# Patient Record
Sex: Male | Born: 1945 | Race: White | Hispanic: No | Marital: Married | Smoking: Former smoker
Health system: Southern US, Community
[De-identification: ages and names within clinical notes are randomized; demographics above are authoritative.]

## PROBLEM LIST (undated history)

## (undated) DIAGNOSIS — T7840XA Allergy, unspecified, initial encounter: Secondary | ICD-10-CM

## (undated) DIAGNOSIS — Z87440 Personal history of urinary (tract) infections: Secondary | ICD-10-CM

## (undated) DIAGNOSIS — R011 Cardiac murmur, unspecified: Secondary | ICD-10-CM

## (undated) DIAGNOSIS — J45909 Unspecified asthma, uncomplicated: Secondary | ICD-10-CM

## (undated) DIAGNOSIS — C801 Malignant (primary) neoplasm, unspecified: Secondary | ICD-10-CM

## (undated) DIAGNOSIS — I2510 Atherosclerotic heart disease of native coronary artery without angina pectoris: Secondary | ICD-10-CM

## (undated) DIAGNOSIS — M1990 Unspecified osteoarthritis, unspecified site: Secondary | ICD-10-CM

## (undated) DIAGNOSIS — Q249 Congenital malformation of heart, unspecified: Secondary | ICD-10-CM

## (undated) DIAGNOSIS — I519 Heart disease, unspecified: Secondary | ICD-10-CM

## (undated) HISTORY — DX: Cardiac murmur, unspecified: R01.1

## (undated) HISTORY — DX: Allergy, unspecified, initial encounter: T78.40XA

## (undated) HISTORY — DX: Malignant (primary) neoplasm, unspecified: C80.1

## (undated) HISTORY — DX: Personal history of urinary (tract) infections: Z87.440

## (undated) HISTORY — PX: PACEMAKER IMPLANT: EP1218

## (undated) HISTORY — DX: Unspecified osteoarthritis, unspecified site: M19.90

## (undated) HISTORY — PX: REPLACEMENT TOTAL KNEE: SUR1224

## (undated) HISTORY — DX: Unspecified asthma, uncomplicated: J45.909

## (undated) HISTORY — DX: Congenital malformation of heart, unspecified: Q24.9

---

## 1991-10-17 HISTORY — PX: MELANOMA EXCISION: SHX5266

## 2006-10-16 HISTORY — PX: CHOLECYSTECTOMY: SHX55

## 2017-10-16 HISTORY — PX: HERNIA REPAIR: SHX51

## 2019-04-26 ENCOUNTER — Other Ambulatory Visit: Payer: Self-pay

## 2019-04-26 ENCOUNTER — Emergency Department (HOSPITAL_COMMUNITY): Payer: Medicare Other

## 2019-04-26 ENCOUNTER — Emergency Department (HOSPITAL_COMMUNITY)
Admission: EM | Admit: 2019-04-26 | Discharge: 2019-04-26 | Disposition: A | Discharge status: Home / Self Care | Payer: Medicare Other | Attending: Emergency Medicine | Admitting: Emergency Medicine

## 2019-04-26 ENCOUNTER — Encounter (HOSPITAL_COMMUNITY): Payer: Self-pay | Admitting: *Deleted

## 2019-04-26 DIAGNOSIS — Z950 Presence of cardiac pacemaker: Secondary | ICD-10-CM | POA: Diagnosis not present

## 2019-04-26 DIAGNOSIS — Y929 Unspecified place or not applicable: Secondary | ICD-10-CM | POA: Insufficient documentation

## 2019-04-26 DIAGNOSIS — Y999 Unspecified external cause status: Secondary | ICD-10-CM | POA: Insufficient documentation

## 2019-04-26 DIAGNOSIS — S6991XA Unspecified injury of right wrist, hand and finger(s), initial encounter: Secondary | ICD-10-CM | POA: Insufficient documentation

## 2019-04-26 DIAGNOSIS — I2510 Atherosclerotic heart disease of native coronary artery without angina pectoris: Secondary | ICD-10-CM | POA: Diagnosis not present

## 2019-04-26 DIAGNOSIS — Y33XXXA Other specified events, undetermined intent, initial encounter: Secondary | ICD-10-CM | POA: Diagnosis not present

## 2019-04-26 DIAGNOSIS — Y939 Activity, unspecified: Secondary | ICD-10-CM | POA: Insufficient documentation

## 2019-04-26 DIAGNOSIS — S6990XA Unspecified injury of unspecified wrist, hand and finger(s), initial encounter: Secondary | ICD-10-CM

## 2019-04-26 HISTORY — DX: Atherosclerotic heart disease of native coronary artery without angina pectoris: I25.10

## 2019-04-26 HISTORY — DX: Heart disease, unspecified: I51.9

## 2019-04-26 LAB — CBC WITH DIFFERENTIAL/PLATELET
Abs Immature Granulocytes: 0.02 10*3/uL (ref 0.00–0.07)
Basophils Absolute: 0 10*3/uL (ref 0.0–0.1)
Basophils Relative: 1 %
Eosinophils Absolute: 0.3 10*3/uL (ref 0.0–0.5)
Eosinophils Relative: 5 %
HCT: 38.4 % — ABNORMAL LOW (ref 39.0–52.0)
Hemoglobin: 12.8 g/dL — ABNORMAL LOW (ref 13.0–17.0)
Immature Granulocytes: 0 %
Lymphocytes Relative: 9 %
Lymphs Abs: 0.5 10*3/uL — ABNORMAL LOW (ref 0.7–4.0)
MCH: 32.1 pg (ref 26.0–34.0)
MCHC: 33.3 g/dL (ref 30.0–36.0)
MCV: 96.2 fL (ref 80.0–100.0)
Monocytes Absolute: 0.7 10*3/uL (ref 0.1–1.0)
Monocytes Relative: 11 %
Neutro Abs: 4.7 10*3/uL (ref 1.7–7.7)
Neutrophils Relative %: 74 %
Platelets: 211 10*3/uL (ref 150–400)
RBC: 3.99 MIL/uL — ABNORMAL LOW (ref 4.22–5.81)
RDW: 14.6 % (ref 11.5–15.5)
WBC: 6.3 10*3/uL (ref 4.0–10.5)
nRBC: 0 % (ref 0.0–0.2)

## 2019-04-26 LAB — BASIC METABOLIC PANEL
Anion gap: 16 — ABNORMAL HIGH (ref 5–15)
BUN: 111 mg/dL — ABNORMAL HIGH (ref 8–23)
CO2: 23 mmol/L (ref 22–32)
Calcium: 8.7 mg/dL — ABNORMAL LOW (ref 8.9–10.3)
Chloride: 101 mmol/L (ref 98–111)
Creatinine, Ser: 3.7 mg/dL — ABNORMAL HIGH (ref 0.61–1.24)
GFR calc Af Amer: 18 mL/min — ABNORMAL LOW (ref >60)
GFR calc non Af Amer: 15 mL/min — ABNORMAL LOW (ref >60)
Glucose, Bld: 113 mg/dL — ABNORMAL HIGH (ref 70–99)
Potassium: 3.2 mmol/L — ABNORMAL LOW (ref 3.5–5.1)
Sodium: 140 mmol/L (ref 135–145)

## 2019-04-26 MED ORDER — SODIUM CHLORIDE 0.9 % IV SOLN: INTRAVENOUS | Status: DC

## 2019-04-26 NOTE — ED Provider Notes (Addendum)
[facility] DEPT Provider Note   CSN: [id number] Arrival date & time: 04/26/19  1608     History   Chief Complaint Chief Complaint  Patient presents with  . Hand Pain    HPI Charles Mcmahon is a 73 y.o. male.     73 year old male presents with pain to his right middle finger which he injured several weeks ago.  Pain characterizes sharp and worse with any movement.  Self medicated at home with a splint.  Patient states he is not having shortness of breath or new dizziness.  States his blood pressure is normally low due to his history of cardiomyopathy and states that his ejection fraction is 35%.  Denies any new cardiac complaints at this time.  States compliance with his home medications and no recent changes to them.     Past Medical History:  Diagnosis Date  . Coronary artery disease     There are no active problems to display for this patient.   Past Surgical History:  Procedure Laterality Date  . PACEMAKER IMPLANT          Home Medications    Prior to Admission medications   Not on File    Family History No family history on file.  Social History Social History   Tobacco Use  . Smoking status: Not on file  Substance Use Topics  . Alcohol use: Not on file  . Drug use: Not on file     Allergies   Patient has no known allergies.   Review of Systems Review of Systems  All other systems reviewed and are negative.    Physical Exam Updated Vital Signs BP (!) 73/45 (BP Location: Left Arm)   Pulse 70   Temp 98 F (36.7 C) (Oral)   Resp 18   SpO2 (!) 88%   Physical Exam Vitals signs and nursing note reviewed.  Constitutional:      General: He is not in acute distress.    Appearance: Normal appearance. He is well-developed. He is not toxic-appearing.  HENT:     Head: Normocephalic and atraumatic.  Eyes:     General: Lids are normal.     Conjunctiva/sclera: Conjunctivae normal.     Pupils: Pupils are equal,  round, and reactive to light.  Neck:     Musculoskeletal: Normal range of motion and neck supple.     Thyroid: No thyroid mass.     Trachea: No tracheal deviation.  Cardiovascular:     Rate and Rhythm: Normal rate and regular rhythm.     Heart sounds: Normal heart sounds. No murmur. No gallop.   Pulmonary:     Effort: Pulmonary effort is normal. No respiratory distress.     Breath sounds: Normal breath sounds. No stridor. No decreased breath sounds, wheezing, rhonchi or rales.  Abdominal:     General: Bowel sounds are normal. There is no distension.     Palpations: Abdomen is soft.     Tenderness: There is no abdominal tenderness. There is no rebound.  Musculoskeletal: Normal range of motion.        General: No tenderness.       Hands:  Skin:    General: Skin is warm and dry.     Findings: No abrasion or rash.  Neurological:     Mental Status: He is alert and oriented to person, place, and time.     GCS: GCS eye subscore is 4. GCS verbal subscore is 5. GCS motor subscore  is 6.     Cranial Nerves: No cranial nerve deficit.     Sensory: No sensory deficit.  Psychiatric:        Speech: Speech normal.        Behavior: Behavior normal.      ED Treatments / Results  Labs (all labs ordered are listed, but only abnormal results are displayed) Labs Reviewed  CBC WITH DIFFERENTIAL/PLATELET  BASIC METABOLIC PANEL    EKG None  Radiology No results found.  Procedures Procedures (including critical care time)  Medications Ordered in ED Medications  0.9 %  sodium chloride infusion (has no administration in time range)     Initial Impression / Assessment and Plan / ED Course  I have reviewed the triage vital signs and the nursing notes.  Pertinent labs & imaging results that were available during my care of the patient were reviewed by me and considered in my medical decision making (see chart for details).       Patient's blood pressure noted and he states he is  normally hypotensive.  He is not orthostatic. Patient's labs reviewed discussed with patient.  Has evidence of kidney injury and patient states that he has CKD but does not know when his baseline creatinine is.  He became very upset about the possibility of staying in the hospital to have this further evaluated.  He is adamant that he would like to leave at this time.  He has capacity to make this decision.  Strongly encouraged to follow-up with his doctor.  X-ray of his hand is negative.  Final Clinical Impressions(s) / ED Diagnoses   Final diagnoses:  None    ED Discharge Orders    None       Lacretia Leigh, MD 04/26/19 1831    Lacretia Leigh, MD 04/26/19 1831

## 2019-04-26 NOTE — Discharge Instructions (Addendum)
You have been offered admission at this time and have deferred concerning your kidney decreased function.  Please follow-up with your doctor.  Your x-rays of your hand are negative

## 2019-04-26 NOTE — ED Triage Notes (Signed)
Pt comes in the middle finger pain on rt hand, noted to have low sats in mid 80's in triage, Hypotensive and bradycardic. Has cardiac history where he normally has both. Upon moving around to get in bed VSS

## 2019-05-27 ENCOUNTER — Ambulatory Visit (INDEPENDENT_AMBULATORY_CARE_PROVIDER_SITE_OTHER): Payer: Medicare Other | Admitting: Family Medicine

## 2019-05-27 ENCOUNTER — Encounter: Payer: Self-pay | Admitting: Family Medicine

## 2019-05-27 VITALS — BP 80/50 | HR 62 | Temp 98.9°F | Resp 16 | Ht 69.0 in | Wt 208.2 lb

## 2019-05-27 DIAGNOSIS — R601 Generalized edema: Secondary | ICD-10-CM

## 2019-05-27 DIAGNOSIS — N289 Disorder of kidney and ureter, unspecified: Secondary | ICD-10-CM

## 2019-05-27 DIAGNOSIS — I509 Heart failure, unspecified: Secondary | ICD-10-CM

## 2019-05-27 DIAGNOSIS — I959 Hypotension, unspecified: Secondary | ICD-10-CM

## 2019-05-27 DIAGNOSIS — Z7901 Long term (current) use of anticoagulants: Secondary | ICD-10-CM | POA: Diagnosis not present

## 2019-05-27 LAB — COMPREHENSIVE METABOLIC PANEL
ALT: 17 U/L (ref 0–53)
AST: 26 U/L (ref 0–37)
Albumin: 2.8 g/dL — ABNORMAL LOW (ref 3.5–5.2)
Alkaline Phosphatase: 527 U/L — ABNORMAL HIGH (ref 39–117)
BUN: 81 mg/dL — ABNORMAL HIGH (ref 6–23)
CO2: 28 mEq/L (ref 19–32)
Calcium: 9 mg/dL (ref 8.4–10.5)
Chloride: 100 mEq/L (ref 96–112)
Creatinine, Ser: 2.4 mg/dL — ABNORMAL HIGH (ref 0.40–1.50)
GFR: 26.6 mL/min — ABNORMAL LOW (ref >60.00)
Glucose, Bld: 106 mg/dL — ABNORMAL HIGH (ref 70–99)
Potassium: 4.3 mEq/L (ref 3.5–5.1)
Sodium: 138 mEq/L (ref 135–145)
Total Bilirubin: 0.9 mg/dL (ref 0.2–1.2)
Total Protein: 5.6 g/dL — ABNORMAL LOW (ref 6.0–8.3)

## 2019-05-27 LAB — URINALYSIS, ROUTINE W REFLEX MICROSCOPIC
Bilirubin Urine: NEGATIVE
Hgb urine dipstick: NEGATIVE
Ketones, ur: NEGATIVE
Leukocytes,Ua: NEGATIVE
Nitrite: NEGATIVE
RBC / HPF: NONE SEEN (ref 0–?)
Specific Gravity, Urine: 1.01 (ref 1.000–1.030)
Total Protein, Urine: NEGATIVE
Urine Glucose: NEGATIVE
Urobilinogen, UA: 0.2 (ref 0.0–1.0)
pH: 6 (ref 5.0–8.0)

## 2019-05-27 LAB — BRAIN NATRIURETIC PEPTIDE: Pro B Natriuretic peptide (BNP): 755 pg/mL — ABNORMAL HIGH (ref 0.0–100.0)

## 2019-05-27 LAB — CBC
HCT: 38 % — ABNORMAL LOW (ref 39.0–52.0)
Hemoglobin: 12.6 g/dL — ABNORMAL LOW (ref 13.0–17.0)
MCHC: 33.1 g/dL (ref 30.0–36.0)
MCV: 96.3 fl (ref 78.0–100.0)
Platelets: 288 10*3/uL (ref 150.0–400.0)
RBC: 3.94 Mil/uL — ABNORMAL LOW (ref 4.22–5.81)
RDW: 15.1 % (ref 11.5–15.5)
WBC: 5.9 10*3/uL (ref 4.0–10.5)

## 2019-05-27 LAB — POCT INR: INR: 5.5 — AB (ref 2.0–3.0)

## 2019-05-27 LAB — TSH: TSH: 5.92 u[IU]/mL — ABNORMAL HIGH (ref 0.35–4.50)

## 2019-05-27 MED ORDER — FUROSEMIDE 80 MG PO TABS: 80.0000 mg | ORAL_TABLET | Freq: Two times a day (BID) | ORAL | 3 refills | Status: DC

## 2019-05-27 MED ORDER — METOLAZONE 5 MG PO TABS: 5.0000 mg | ORAL_TABLET | Freq: Every day | ORAL | 2 refills | Status: DC | PRN

## 2019-05-27 NOTE — Patient Instructions (Signed)
A few things to remember from today's visit:   Chronic heart failure, unspecified heart failure type ([place]) - Plan: Ambulatory referral to Cardiology, Brain Natriuretic Peptide, furosemide (LASIX) 80 MG tablet, metolazone (ZAROXOLYN) 5 MG tablet  Edema, unspecified type - Plan: Comprehensive metabolic panel, Protein / creatinine ratio, urine, TSH, Urinalysis, Routine w reflex microscopic, CBC  Hypotension, unspecified hypotension type - Plan: Comprehensive metabolic panel, CBC I recommend taking the Zaroxolyn 5 mg daily  Depending on the lab results I might need to send you to the hospital.  Please be sure medication list is accurate. If a new problem present, please set up appointment sooner than planned today.

## 2019-05-27 NOTE — Progress Notes (Signed)
HPI:   Charles Mcmahon is a 73 y.o. male, who is here today to establish care.  Former PCP: He moved to [place] about 5 weeks ago from [place]. Last preventive routine visit: He is not sure.  Chronic medical problems: CHF, asthma, OA, history of melanoma, status post pacemaker placement,RLS,and BPH among some. He lives with his wife,he is her caregiver. She had CVA with residual weakness.  Today he is complaining of having a hernia. He noted bilateral scrotal edema about 5 weeks ago, which he attributed to heavy lifting during the move. Progressively getting worse. He is convinced this is a hernia.  He denies fever, chills, abdominal pain, nausea, vomiting, changes in bowel habits, or blood in the stool. Negative for dysuria, hematuria, increased urinary frequency, or decreased urine output.  He is having associated left inguinal pain, sharp, intermittently that for a few seconds. He has not identified exacerbating or alleviating factors.  He denies fever, chills, changes in appetite, or abnormal weight loss. He reports history of CHF, LVEF 35%. He is sleeping on one pillow, states that this is his usual. He denies PND. History of exertional dyspnea, which seems to be stable. He denies chest pain, palpitations, diaphoresis, or syncope.  Recently he was in the hospital due to a fall, BMP very abnormal, apparently he refused hospitalization. According to patient he "always" has had abnormal renal function but he has never seen a nephrologist.  Lab Results  Component Value Date   CREATININE 3.70 (H) 04/26/2019   BUN 111 (H) 04/26/2019   NA 140 04/26/2019   K 3.2 (L) 04/26/2019   CL 101 04/26/2019   CO2 23 04/26/2019   Lab Results  Component Value Date   WBC 6.3 04/26/2019   HGB 12.8 (L) 04/26/2019   HCT 38.4 (L) 04/26/2019   MCV 96.2 04/26/2019   PLT 211 04/26/2019   He is currently on furosemide 80 mg twice daily, metoprolol 50 mg 1/2 tablet twice  daily, losartan 50 mg daily  He is also on Coumadin 2.5 mg daily. Hx of "cardiac arrhythmia."  HTN,he is on Cozaar 50 mg daily and Metoprolol Succinate 50 mg daily. He is not checking BP at home.   Review of Systems  Constitutional: Positive for activity change. Negative for fatigue.  HENT: Negative for nosebleeds, sore throat and trouble swallowing.   Eyes: Negative for redness and visual disturbance.  Respiratory: Negative for cough and wheezing.   Cardiovascular: Negative for leg swelling.  Gastrointestinal: Negative for abdominal distention and rectal pain.  Endocrine: Negative for cold intolerance and heat intolerance.  Genitourinary: Negative for decreased urine volume, discharge, dysuria, flank pain, hematuria and testicular pain.  Musculoskeletal: Positive for arthralgias and gait problem.  Skin: Negative for rash and wound.  Allergic/Immunologic: Positive for environmental allergies.  Neurological: Negative for weakness and headaches.  Psychiatric/Behavioral: The patient is nervous/anxious.   Rest see pertinent positives and negatives per HPI.   Current Outpatient Medications on File Prior to Visit  Medication Sig Dispense Refill  . albuterol (VENTOLIN HFA) 108 (90 Base) MCG/ACT inhaler Inhale 2 puffs into the lungs as needed.    . ATROVENT HFA 17 MCG/ACT inhaler Inhale 2 puffs into the lungs 2 (two) times daily.    . finasteride (PROSCAR) 5 MG tablet Take by mouth daily.    Marland Kitchen losartan (COZAAR) 50 MG tablet Take by mouth daily.    . metoprolol succinate (TOPROL-XL) 50 MG 24 hr tablet Take by mouth. Take 1/2  tablet twice daily (25 mg)    . warfarin (COUMADIN) 2.5 MG tablet Take by mouth daily.     No current facility-administered medications on file prior to visit.      Past Medical History:  Diagnosis Date  . Allergy   . Arthritis   . Asthma   . Cancer ([place])   . Cardiac arrhythmia due to congenital heart disease   . Coronary artery disease   . Heart disease    . Heart disease   . Heart murmur   . Hx: UTI (urinary tract infection)    No Known Allergies  Family History  Problem Relation Age of Onset  . Arthritis Mother     Social History   Socioeconomic History  . Marital status: Married    Spouse name: Not on file  . Number of children: 2  . Years of education: Not on file  . Highest education level: Not on file  Occupational History  . Not on file  Social Needs  . Financial resource strain: Not on file  . Food insecurity    Worry: Not on file    Inability: Not on file  . Transportation needs    Medical: Not on file    Non-medical: Not on file  Tobacco Use  . Smoking status: Former Research scientist (life sciences)  . Smokeless tobacco: Never Used  Substance and Sexual Activity  . Alcohol use: Yes  . Drug use: Never  . Sexual activity: Not Currently  Lifestyle  . Physical activity    Days per week: Not on file    Minutes per session: Not on file  . Stress: Not on file  Relationships  . Social Herbalist on phone: Not on file    Gets together: Not on file    Attends religious service: Not on file    Active member of club or organization: Not on file    Attends meetings of clubs or organizations: Not on file    Relationship status: Not on file  Other Topics Concern  . Not on file  Social History Narrative  . Not on file    Vitals:   05/27/19 1228  BP: (!) 80/50  Pulse: 62  Resp: 16  Temp: 98.9 F (37.2 C)  SpO2: 97%    Body mass index is 30.75 kg/m.  Physical Exam  Nursing note and vitals reviewed. Constitutional: He is oriented to person, place, and time. He appears well-developed. No distress.  HENT:  Head: Normocephalic and atraumatic.  Mouth/Throat: Oropharynx is clear and moist and mucous membranes are normal.  Eyes: Pupils are equal, round, and reactive to light. Conjunctivae are normal.  Cardiovascular: Normal rate and regular rhythm.  No murmur heard. ? S3  Respiratory: Effort normal and breath sounds  normal. No respiratory distress.  GI: Soft. He exhibits no mass. There is no hepatomegaly. There is no abdominal tenderness.  Genitourinary:    Genitourinary Comments: Severe scrotal and penis edema.   Musculoskeletal:        General: Edema (3+ pitting LE's edema, right hand,and face.) present.  Lymphadenopathy:    He has no cervical adenopathy.  Neurological: He is alert and oriented to person, place, and time. He has normal strength.  Unstable gait assisted by cane.  Skin: Skin is warm. No rash noted. No erythema.  Psychiatric: He has a normal mood and affect.  Well groomed, good eye contact.    ASSESSMENT AND PLAN:  Mr. Haven was seen today for establish  care.  Diagnoses and all orders for this visit:  Lab Results  Component Value Date   CREATININE 2.40 (H) 05/27/2019   BUN 81 (H) 05/27/2019   NA 138 05/27/2019   K 4.3 05/27/2019   CL 100 05/27/2019   CO2 28 05/27/2019   Lab Results  Component Value Date   WBC 5.9 05/27/2019   HGB 12.6 (L) 05/27/2019   HCT 38.0 (L) 05/27/2019   MCV 96.3 05/27/2019   PLT 288.0 05/27/2019   Lab Results  Component Value Date   TSH 5.92 (H) 05/27/2019     Chronic heart failure, unspecified heart failure type [facility]) Cardiology referral placed. According to pt,he was told to keep BP low. He is already taking a good dose of furosemide. I recommended taking the metoprolol 5 mg daily before morning furosemide. We discussed side effects of this medication, including worsening renal function. Continue fluid restriction, 1 L/day of fluids.  -     Ambulatory referral to Cardiology -     Brain Natriuretic Peptide -     furosemide (LASIX) 80 MG tablet; Take 1 tablet (80 mg total) by mouth 2 (two) times daily. -     metolazone (ZAROXOLYN) 5 MG tablet; Take 1 tablet (5 mg total) by mouth daily as needed.  Anasarca I do not think scrotal edema is due to hernia but rather edema. No changes in Furosemide dose. Zaroxolyn 5 mg daily for now.  Low salt diet.  -     Comprehensive metabolic panel -     Cancel: Protein / creatinine ratio, urine -     TSH -     Urinalysis, Routine w reflex microscopic -     CBC -     Protein / creatinine ratio, urine  Hypotension, unspecified hypotension type Asymptomatic. He does not want me to change antihypertensive medications. Recommend monitoring BP at home.  -     Comprehensive metabolic panel -     CBC  Chronic anticoagulation INR supra therapeutic, 5.5. No evidence of active bleeding He was instructed to hold on Coumadin for the next 2 days. He has an appointment with Coumadin clinic on 05/29/2019. -     POC INR   Kidney disease He is reporting that he has Hx of renal disease but has not followed with nephrologist. ? AKF He refused going to the ER. I explained that depending of lab results today, he may need to be hospitalized, he tells me he will not go.He understand the high risk of complications that could even cause  death.  At the time of his visit I do not have records from former pcp.  He was clearly instructed about warning sign.  Return in about 1 week (around 06/03/2019) for Edema and renal function..     Mariena Meares G. [name], MD  [facility]. [place] office.

## 2019-05-28 LAB — PROTEIN / CREATININE RATIO, URINE
Creatinine, Urine: 74 mg/dL (ref 20–320)
Protein/Creat Ratio: 122 mg/g creat (ref 22–128)
Protein/Creatinine Ratio: 0.122 mg/mg creat (ref 0.022–0.12)
Total Protein, Urine: 9 mg/dL (ref 5–25)

## 2019-05-30 ENCOUNTER — Other Ambulatory Visit: Payer: Self-pay

## 2019-05-30 ENCOUNTER — Other Ambulatory Visit (INDEPENDENT_AMBULATORY_CARE_PROVIDER_SITE_OTHER): Payer: Medicare Other

## 2019-05-30 ENCOUNTER — Other Ambulatory Visit: Payer: Self-pay | Admitting: Family Medicine

## 2019-05-30 ENCOUNTER — Telehealth: Payer: Self-pay | Admitting: Family Medicine

## 2019-05-30 DIAGNOSIS — R748 Abnormal levels of other serum enzymes: Secondary | ICD-10-CM

## 2019-05-30 DIAGNOSIS — N289 Disorder of kidney and ureter, unspecified: Secondary | ICD-10-CM

## 2019-05-30 LAB — BASIC METABOLIC PANEL
BUN: 74 mg/dL — ABNORMAL HIGH (ref 6–23)
CO2: 28 mEq/L (ref 19–32)
Calcium: 8.4 mg/dL (ref 8.4–10.5)
Chloride: 100 mEq/L (ref 96–112)
Creatinine, Ser: 2.29 mg/dL — ABNORMAL HIGH (ref 0.40–1.50)
GFR: 28.08 mL/min — ABNORMAL LOW (ref >60.00)
Glucose, Bld: 154 mg/dL — ABNORMAL HIGH (ref 70–99)
Potassium: 3.8 mEq/L (ref 3.5–5.1)
Sodium: 139 mEq/L (ref 135–145)

## 2019-05-30 LAB — PSA: PSA: 0.25 ng/mL (ref 0.10–4.00)

## 2019-05-30 NOTE — Telephone Encounter (Signed)
Copied from [place] [phone]. Topic: General - Inquiry >> May 30, 2019  9:38 AM Virl Axe D wrote: Reason for CRM: [facility] would like to know if Dr. [name] would take over pt's coumadin monitoring. Requesting CB. #[phone]

## 2019-05-30 NOTE — Telephone Encounter (Signed)
Message sent to Dr. Jordan for review and approval. 

## 2019-05-30 NOTE — Telephone Encounter (Signed)
I thought I already addressed this question. Please schedule patient with Coumadin clinic. Thanks, BJ

## 2019-06-02 ENCOUNTER — Telehealth: Payer: Self-pay | Admitting: Family Medicine

## 2019-06-02 NOTE — Telephone Encounter (Signed)
Caller name: Jenn  Relation to pt: from [facility]  Call back number: [phone]   Reason for call:  Would like to know if PCP will take over patient INR, please advise

## 2019-06-03 ENCOUNTER — Telehealth: Payer: Self-pay | Admitting: Family Medicine

## 2019-06-03 NOTE — Telephone Encounter (Signed)
Left detailed message for [facility] concerning patient.

## 2019-06-03 NOTE — Telephone Encounter (Signed)
Pt returned call for lab results. He was read the note by Dr [name] on 06/03/2019. Pt states that he has Coumadin visit tomorrow scheduled with coumadin clinic.

## 2019-06-03 NOTE — Telephone Encounter (Signed)
Left detailed message for [facility] concerning patient.

## 2019-06-03 NOTE — Telephone Encounter (Signed)
Spoke with Danise Mina and informed her that patient can be seen with the coumadin clinic. Danise Mina stated that she will fax over last few results to the office.

## 2019-06-04 ENCOUNTER — Other Ambulatory Visit: Payer: Self-pay | Admitting: Family Medicine

## 2019-06-04 ENCOUNTER — Other Ambulatory Visit: Payer: Self-pay

## 2019-06-04 DIAGNOSIS — N5089 Other specified disorders of the male genital organs: Secondary | ICD-10-CM

## 2019-06-05 ENCOUNTER — Ambulatory Visit
Admission: RE | Admit: 2019-06-05 | Discharge: 2019-06-05 | Disposition: A | Discharge status: Home / Self Care | Payer: Medicare Other | Source: Ambulatory Visit | Attending: Family Medicine | Admitting: Family Medicine

## 2019-06-05 DIAGNOSIS — R748 Abnormal levels of other serum enzymes: Secondary | ICD-10-CM

## 2019-06-09 ENCOUNTER — Telehealth: Payer: Self-pay

## 2019-06-09 NOTE — Telephone Encounter (Signed)
Tried returning call to patient. Left message to return call to office.

## 2019-06-09 NOTE — Telephone Encounter (Signed)
Copied from [place] [phone]. Topic: General - Other >> Jun 09, 2019 11:19 AM Celene Kras A wrote: Reason for CRM: Pt called and is requesting to speak with PCP or her nurse about his condition. Pt states it has changed and he feels they should be updated. Please advise.

## 2019-06-10 NOTE — Telephone Encounter (Signed)
Left message for patient to return call to office. 

## 2019-06-10 NOTE — Telephone Encounter (Signed)
Patient returned call to clinic and scheduled ov for 06/17/2019 to discuss with PCP. Nothing further needed at this time.

## 2019-06-11 ENCOUNTER — Ambulatory Visit
Admission: RE | Admit: 2019-06-11 | Discharge: 2019-06-11 | Disposition: A | Discharge status: Home / Self Care | Payer: Medicare Other | Source: Ambulatory Visit | Attending: Family Medicine | Admitting: Family Medicine

## 2019-06-11 DIAGNOSIS — N5089 Other specified disorders of the male genital organs: Secondary | ICD-10-CM

## 2019-06-17 ENCOUNTER — Ambulatory Visit (INDEPENDENT_AMBULATORY_CARE_PROVIDER_SITE_OTHER): Payer: Medicare Other | Admitting: Family Medicine

## 2019-06-17 ENCOUNTER — Ambulatory Visit (INDEPENDENT_AMBULATORY_CARE_PROVIDER_SITE_OTHER): Payer: Medicare Other | Admitting: General Practice

## 2019-06-17 ENCOUNTER — Other Ambulatory Visit: Payer: Medicare Other

## 2019-06-17 ENCOUNTER — Other Ambulatory Visit: Payer: Self-pay

## 2019-06-17 ENCOUNTER — Ambulatory Visit (INDEPENDENT_AMBULATORY_CARE_PROVIDER_SITE_OTHER): Payer: Medicare Other

## 2019-06-17 ENCOUNTER — Telehealth: Payer: Self-pay | Admitting: *Deleted

## 2019-06-17 ENCOUNTER — Encounter: Payer: Self-pay | Admitting: Family Medicine

## 2019-06-17 VITALS — BP 82/54 | HR 60 | Temp 98.2°F | Resp 16 | Ht 69.0 in | Wt 194.0 lb

## 2019-06-17 DIAGNOSIS — Z23 Encounter for immunization: Secondary | ICD-10-CM | POA: Diagnosis not present

## 2019-06-17 DIAGNOSIS — R748 Abnormal levels of other serum enzymes: Secondary | ICD-10-CM

## 2019-06-17 DIAGNOSIS — Z7901 Long term (current) use of anticoagulants: Secondary | ICD-10-CM

## 2019-06-17 DIAGNOSIS — M545 Low back pain, unspecified: Secondary | ICD-10-CM

## 2019-06-17 DIAGNOSIS — N289 Disorder of kidney and ureter, unspecified: Secondary | ICD-10-CM | POA: Insufficient documentation

## 2019-06-17 DIAGNOSIS — I4821 Permanent atrial fibrillation: Secondary | ICD-10-CM | POA: Insufficient documentation

## 2019-06-17 DIAGNOSIS — R739 Hyperglycemia, unspecified: Secondary | ICD-10-CM

## 2019-06-17 DIAGNOSIS — N189 Chronic kidney disease, unspecified: Secondary | ICD-10-CM | POA: Diagnosis not present

## 2019-06-17 DIAGNOSIS — I4891 Unspecified atrial fibrillation: Secondary | ICD-10-CM

## 2019-06-17 DIAGNOSIS — I119 Hypertensive heart disease without heart failure: Secondary | ICD-10-CM | POA: Diagnosis not present

## 2019-06-17 DIAGNOSIS — I509 Heart failure, unspecified: Secondary | ICD-10-CM | POA: Diagnosis not present

## 2019-06-17 LAB — BASIC METABOLIC PANEL
BUN: 92 mg/dL (ref 6–23)
CO2: 30 mEq/L (ref 19–32)
Calcium: 8.3 mg/dL — ABNORMAL LOW (ref 8.4–10.5)
Chloride: 97 mEq/L (ref 96–112)
Creatinine, Ser: 2.63 mg/dL — ABNORMAL HIGH (ref 0.40–1.50)
GFR: 23.93 mL/min — ABNORMAL LOW (ref >60.00)
Glucose, Bld: 98 mg/dL (ref 70–99)
Potassium: 3.4 mEq/L — ABNORMAL LOW (ref 3.5–5.1)
Sodium: 137 mEq/L (ref 135–145)

## 2019-06-17 LAB — POCT INR: INR: 5.4 — AB (ref 2.0–3.0)

## 2019-06-17 LAB — BRAIN NATRIURETIC PEPTIDE: Pro B Natriuretic peptide (BNP): 661 pg/mL — ABNORMAL HIGH (ref 0.0–100.0)

## 2019-06-17 MED ORDER — METOPROLOL SUCCINATE ER 25 MG PO TB24: 25.0000 mg | ORAL_TABLET | Freq: Every day | ORAL | 1 refills | Status: DC

## 2019-06-17 NOTE — Patient Instructions (Addendum)
Pre visit review using our clinic review tool, if applicable. No additional management support is needed unless otherwise documented below in the visit note.  Hold coumadin today, Wed and Thursday. On Friday continue 1 tablet (2 mg) daily and re-check on Monday 9/14.  Patient had appointment with Dr. [name] at [place] today.  INR was checked in the lab at [facility] and results routed to [place] [place] [facility].

## 2019-06-17 NOTE — Telephone Encounter (Signed)
Spoke with patient and gave directions per Dr. Jordan. Patient verbalized understanding. 

## 2019-06-17 NOTE — Progress Notes (Signed)
HPI:   Charles Mcmahon is a 73 y.o. male, who is here today for chronic disease management.  Appointment with cardiologist and nephrologist are pending.  Denies severe/frequent headache, visual changes, chest pain, dyspnea, palpitation, claudication, focal weakness.  Edema has improved greatly. Problem is worse at the end of the day after prolonged standing,including scrotal edema. Denies LE or testicular pain.  He is on Furosemide 80 mg bid ans Metolazone 5 mg before morning Furosemide.  Scrotal US on 06/11/19: Significant BILATERAL scrotal wall edema. Normal appearing testes and LEFT epididymis. Heterogeneous enlarged and mildly hypervascular RIGHT epididymis question epididymitis, with associated small reactive hydrocele.  BP is low today and has been low during prior visits, he is not checking BP at home. Currently he is on metoprolol succinate 1/2 tablet twice daily and losartan 50 mg daily.  He denies orthopnea or PND.  No Hx of DM,glucose has been elevated at 154.  CKD, he already received phone call with information about appointment with nephrologist, next week. He denies gross hematuria, foamy urine, or decreased urine output.  Lab Results  Component Value Date   CREATININE 2.29 (H) 05/30/2019   BUN 74 (H) 05/30/2019   NA 139 05/30/2019   K 3.8 05/30/2019   CL 100 05/30/2019   CO2 28 05/30/2019   Atrial fibrillation: He did not have INR checked as instructed. Last INR was 5.5 (goal 2-3). Pending appt with cardiologist.  He is taking Coumadin 2 mg daily. He denies nose/gum bleeding, blood in the stool, or more bruising than usual.  We also review results of recent abdominal US. He denies high alcohol intake.  Elevated alk phosphatase and abnormal liver US.  Lab Results  Component Value Date   ALT 17 05/27/2019   AST 26 05/27/2019   ALKPHOS 527 (H) 05/27/2019   BILITOT 0.9 05/27/2019   Lab Results  Component Value Date   PSA 0.25  05/30/2019    Abdominal US 06/11/19: AIMPRESSION: 1. Liver contour and echotexture suggest a degree of underlying cirrhosis. Appropriate laboratory correlation advised in this regard. 2. Uniformly hyperechoic mass in the right lobe of the liver measuring 1.7 x 1.5 x 1.7 cm. Suspect hemangioma. As there are no prior studies to compare, a follow-up ultrasound of the liver in 1 year to confirm stability advised. 3.  Gallbladder absent. 4. Size discrepancy between kidneys, a finding of uncertain significance. This finding potentially could indicate a degree of renal artery stenosis on the right. In this regard, question whether patient is hypertensive. 5.  Mild ascites. 6. Portions of pancreas obscured by gas. Visualized portions ofpancreas appear normal. 7.  Small left renal cyst.bdominal [place] 06/11/19:   Review of Systems  Constitutional: Positive for fatigue. Negative for chills and fever.  HENT: Negative for mouth sores, nosebleeds and sore throat.   Gastrointestinal: Negative for abdominal pain, nausea and vomiting.  Musculoskeletal: Positive for gait problem.  Skin: Negative for rash and wound.  Neurological: Negative for syncope and facial asymmetry.  Rest see pertinent positives and negatives per HPI.   Current Outpatient Medications on File Prior to Visit  Medication Sig Dispense Refill  . albuterol (VENTOLIN HFA) 108 (90 Base) MCG/ACT inhaler Inhale 2 puffs into the lungs as needed.    . ATROVENT HFA 17 MCG/ACT inhaler Inhale 2 puffs into the lungs 2 (two) times daily.    . finasteride (PROSCAR) 5 MG tablet Take by mouth daily.    . furosemide (LASIX) 80 MG tablet Take  1 tablet (80 mg total) by mouth 2 (two) times daily. 60 tablet 3  . losartan (COZAAR) 50 MG tablet Take by mouth daily.    . metolazone (ZAROXOLYN) 5 MG tablet Take 1 tablet (5 mg total) by mouth daily as needed. 30 tablet 2  . warfarin (COUMADIN) 2.5 MG tablet Take by mouth daily.     No current  facility-administered medications on file prior to visit.      Past Medical History:  Diagnosis Date  . Allergy   . Arthritis   . Asthma   . Cancer ([place])   . Cardiac arrhythmia due to congenital heart disease   . Coronary artery disease   . Heart disease   . Heart disease   . Heart murmur   . Hx: UTI (urinary tract infection)    No Known Allergies  Social History   Socioeconomic History  . Marital status: Married    Spouse name: Not on file  . Number of children: 2  . Years of education: Not on file  . Highest education level: Not on file  Occupational History  . Not on file  Social Needs  . Financial resource strain: Not on file  . Food insecurity    Worry: Not on file    Inability: Not on file  . Transportation needs    Medical: Not on file    Non-medical: Not on file  Tobacco Use  . Smoking status: Former Research scientist (life sciences)  . Smokeless tobacco: Never Used  Substance and Sexual Activity  . Alcohol use: Yes  . Drug use: Never  . Sexual activity: Not Currently  Lifestyle  . Physical activity    Days per week: Not on file    Minutes per session: Not on file  . Stress: Not on file  Relationships  . Social Herbalist on phone: Not on file    Gets together: Not on file    Attends religious service: Not on file    Active member of club or organization: Not on file    Attends meetings of clubs or organizations: Not on file    Relationship status: Not on file  Other Topics Concern  . Not on file  Social History Narrative  . Not on file    Vitals:   06/17/19 0830  BP: (!) 82/54  Pulse: 60  Resp: 16  Temp: 98.2 F (36.8 C)  SpO2: 94%   Body mass index is 28.65 kg/m.   Wt Readings from Last 3 Encounters:  06/17/19 194 lb (88 kg)  05/27/19 208 lb 4 oz (94.5 kg)  04/26/19 200 lb (90.7 kg)    Physical Exam  Nursing note reviewed. Constitutional: He is oriented to person, place, and time. He appears well-developed. No distress.  HENT:  Head:  Normocephalic and atraumatic.  Mouth/Throat: Oropharynx is clear and moist and mucous membranes are normal.  Eyes: Pupils are equal, round, and reactive to light. Conjunctivae are normal.  Cardiovascular: Normal rate. An irregular rhythm present.  Occasional extrasystoles are present.  No murmur heard. Pulses:      Posterior tibial pulses are 2+ on the right side and 2+ on the left side.  Respiratory: Effort normal and breath sounds normal. No respiratory distress.  GI: Soft. He exhibits no mass. There is no hepatomegaly. There is no abdominal tenderness.  Musculoskeletal:        General: Edema (2+ pitting LE edema,bilateral.Mild upper eye lid edema.) present.     Thoracic back:  He exhibits no tenderness and no bony tenderness.     Lumbar back: He exhibits no tenderness and no bony tenderness.  Lymphadenopathy:    He has no cervical adenopathy.  Neurological: He is alert and oriented to person, place, and time. He has normal strength. No cranial nerve deficit. Gait abnormal.  Unstable gait assisted with a cane.  Skin: Skin is warm. No rash noted. No erythema.  Psychiatric: He has a normal mood and affect. Cognition and memory are normal.  Well groomed, good eye contact.    ASSESSMENT AND PLAN:  Mr. Aydrien was seen today for discuss results.  Diagnoses and all orders for this visit:  Lab Results  Component Value Date   CREATININE 2.63 (H) 06/17/2019   BUN 92 (HH) 06/17/2019   NA 137 06/17/2019   K 3.4 (L) 06/17/2019   CL 97 06/17/2019   CO2 30 06/17/2019   Lab Results  Component Value Date   INR 5.4 (A) 06/17/2019   INR 5.5 (A) 05/27/2019    Elevated alkaline phosphatase level Unknown etiology. Lumbar X ray ordered to evaluate for lytic lesions. We will continue following. RUQ to be related in a year.  -     DG Lumbar Spine Complete; Future  Chronic anticoagulation INR has been supra therapeutic. He is not having signs of bleeding. He is going to continue  following with coumadin clinic,seeing today.  -     POC INR  Chronic kidney disease, unspecified CKD stage Problem has been otherwise stable. He is not sure about Hx of CKD before ER visit,we do not have records from former PCP. No changes in diuretic or Losartan for now.  -     Basic metabolic panel; Future  Hypertension with heart disease BP is low. Metoprolol Succinate dose changed from 50 mg to 25 mg daily. Monitor BP at home. Continue low salt diet.  -     Basic metabolic panel -     metoprolol succinate (TOPROL-XL) 25 MG 24 hr tablet; Take 1 tablet (25 mg total) by mouth daily. -     Basic metabolic panel; Future  Chronic heart failure, unspecified heart failure type ([place]) Asymptomatic. On Metoprolol succinate and Losartan. No changes in Furosemide dose. We do not have reports of past echos.  -     metoprolol succinate (TOPROL-XL) 25 MG 24 hr tablet; Take 1 tablet (25 mg total) by mouth daily. -     Brain Natriuretic Peptide -     Basic metabolic panel; Future  Right-sided low back pain without sciatica, unspecified chronicity Most likely musculoskeletal. Because elevated alk phosphatase,X ray was ordered. Instructed about warning signs.  -     DG Lumbar Spine Complete; Future  Need for influenza vaccination -     Flu Vaccine QUAD High Dose(Fluad)  Atrial fibrillation, unspecified type ([place]) Rate controlled. Continue coumadin and Metoprolol. Pending appt with cardiologist.  Hyperglycemia -     Hemoglobin A1c; Future   Return in about 2 months (around 08/17/2019) for HTN. He needs coumadin visit in a week..    -Mr. Vasil A Mathers was advised to return sooner than planned today if new concerns arise.    [name]. [name], MD  [facility]. [place] office.

## 2019-06-17 NOTE — Telephone Encounter (Signed)
Left vm message for patient to return call to office concerning lab results.

## 2019-06-17 NOTE — Patient Instructions (Addendum)
A few things to remember from today's visit:   Elevated alkaline phosphatase level  Chronic anticoagulation - Plan: POC INR  Kidney disease  Hypertension with heart disease - Plan: Basic metabolic panel, metoprolol succinate (TOPROL-XL) 25 MG 24 hr tablet  Chronic heart failure, unspecified heart failure type ([place]) - Plan: metoprolol succinate (TOPROL-XL) 25 MG 24 hr tablet, Brain Natriuretic Peptide  Because your blood pressure is low I am decreasing metoprolol succinate from 50 mg to 25 mg. No changes in losartan. Monitor blood pressure at home. INR is being checked today, will adjust Coumadin accordingly. Keep appointment with nephrologist. Pending appointment with cardiologist. Continue low-salt diet and fluid restriction. No changes in fluid pills.  Please be sure medication list is accurate. If a new problem present, please set up appointment sooner than planned today.

## 2019-06-17 NOTE — Telephone Encounter (Signed)
Hope from [facility] called to give critical lab for patient, BUN is high at 92.

## 2019-06-17 NOTE — Telephone Encounter (Signed)
He is on Metolazone 5 mg daily (please verify),skip dose today and tomorrow.  Then continue metolazone 5 mg daily every other day.  He has an appt with nephrologist next week,BMP needs to be repeated Thursday.  Thanks, BJ

## 2019-06-25 ENCOUNTER — Telehealth: Payer: Self-pay

## 2019-06-25 NOTE — Telephone Encounter (Signed)
Copied from [place] [id number]. Topic: General - Other >> Jun 24, 2019  3:24 PM Mathis Bud wrote: Reason for CRM: Patient is calling to request a call back due from PCP, due to his hernia. He is requesting pain medication.  Patient states he cannot move around.   Call back [phone]

## 2019-06-26 NOTE — Telephone Encounter (Signed)
Spoke to pt and he stated that he is still having pain but its not constant. Pt stated that he never knows when the pain is going to "kick in". Pt is wanting a Psychologist, sport and exercise for hernia issue A.S.A.P.

## 2019-06-27 NOTE — Telephone Encounter (Signed)
I am not certain a hernia is causing pain. Even if in fact he has a hernia he is a high risk surgical candidate due to health issues/kidney disease.  Scrotal and abdominal US did not report hernia but mild epididymitis and scrotal wall edema. We could not arrange for abdominal CT because abnormal renal function.  We could ask urologist for consultation.  He was supposed to see nephrologist this week, did he? Has he seen cardiologist?  Thanks, BJ

## 2019-06-30 ENCOUNTER — Ambulatory Visit: Payer: Medicare Other

## 2019-06-30 ENCOUNTER — Other Ambulatory Visit: Payer: Self-pay

## 2019-06-30 ENCOUNTER — Ambulatory Visit (INDEPENDENT_AMBULATORY_CARE_PROVIDER_SITE_OTHER): Payer: Medicare Other | Admitting: General Practice

## 2019-06-30 DIAGNOSIS — Z7901 Long term (current) use of anticoagulants: Secondary | ICD-10-CM | POA: Diagnosis not present

## 2019-06-30 LAB — POCT INR: INR: 4.5 — AB (ref 2.0–3.0)

## 2019-06-30 NOTE — Patient Instructions (Signed)
Pre visit review using our clinic review tool, if applicable. No additional management support is needed unless otherwise documented below in the visit note.  Skip coumadin today and tomorrow (9/14 and 9/15).  On Wednesday start taking 1 tablet daily except 1/2 tablet on Monday and Fridays.  Re-check in 2 weeks.

## 2019-07-01 NOTE — Progress Notes (Signed)
I have reviewed available documentation from this visit and I agree with recommendations given.  Floyd Wade G. Hennesy Sobalvarro, MD  [facility]. [place] office.  

## 2019-07-02 NOTE — Telephone Encounter (Signed)
Patient notified of update  and verbalized understanding. 

## 2019-07-14 ENCOUNTER — Ambulatory Visit (INDEPENDENT_AMBULATORY_CARE_PROVIDER_SITE_OTHER): Payer: Medicare Other | Admitting: General Practice

## 2019-07-14 ENCOUNTER — Other Ambulatory Visit: Payer: Self-pay

## 2019-07-14 DIAGNOSIS — Z7901 Long term (current) use of anticoagulants: Secondary | ICD-10-CM | POA: Diagnosis not present

## 2019-07-14 LAB — POCT INR: INR: 3.6 — AB (ref 2.0–3.0)

## 2019-07-14 NOTE — Patient Instructions (Signed)
Pre visit review using our clinic review tool, if applicable. No additional management support is needed unless otherwise documented below in the visit note.  Skip coumadin today and then change dosage and start taking 1 tablet daily except 1/2 tablet on Monday Wed and Fridays.  Re-check in 3 weeks. If you get the coag meter within the next 3 weeks check your INR and call me with results at [phone].

## 2019-07-17 ENCOUNTER — Ambulatory Visit (INDEPENDENT_AMBULATORY_CARE_PROVIDER_SITE_OTHER): Payer: Medicare Other | Admitting: General Practice

## 2019-07-17 DIAGNOSIS — Z7901 Long term (current) use of anticoagulants: Secondary | ICD-10-CM | POA: Diagnosis not present

## 2019-07-17 LAB — POCT INR: INR: 2.4 (ref 2.0–3.0)

## 2019-07-17 NOTE — Patient Instructions (Signed)
Pre visit review using our clinic review tool, if applicable. No additional management support is needed unless otherwise documented below in the visit note.  Continue taking 1 tablet daily except 1/2 tablet on Monday Wed and Fridays.  Re-check in 2 weeks. Patient is using [employer] Connected home monitoring.

## 2019-08-01 ENCOUNTER — Ambulatory Visit (INDEPENDENT_AMBULATORY_CARE_PROVIDER_SITE_OTHER): Payer: Medicare Other | Admitting: General Practice

## 2019-08-01 DIAGNOSIS — Z7901 Long term (current) use of anticoagulants: Secondary | ICD-10-CM

## 2019-08-01 LAB — POCT INR: INR: 2.5 (ref 2.0–3.0)

## 2019-08-01 NOTE — Progress Notes (Signed)
Medical screening examination/treatment/procedure(s) were performed by non-physician practitioner and as supervising physician I was immediately available for consultation/collaboration. I agree with above. Raahil Ong, MD   

## 2019-08-01 NOTE — Patient Instructions (Signed)
Pre visit review using our clinic review tool, if applicable. No additional management support is needed unless otherwise documented below in the visit note.  Continue taking 1 tablet daily except 1/2 tablet on Monday Wed and Fridays.  Re-check in 2 weeks. Patient is using [employer] Connected home monitoring.  Patient verbalized dosing instructions.

## 2019-08-05 ENCOUNTER — Encounter: Payer: Self-pay | Admitting: Cardiology

## 2019-08-05 ENCOUNTER — Other Ambulatory Visit: Payer: Self-pay

## 2019-08-05 ENCOUNTER — Ambulatory Visit (INDEPENDENT_AMBULATORY_CARE_PROVIDER_SITE_OTHER): Payer: Medicare Other | Admitting: Cardiology

## 2019-08-05 VITALS — BP 84/53 | HR 70 | Ht 69.0 in | Wt 176.0 lb

## 2019-08-05 DIAGNOSIS — I442 Atrioventricular block, complete: Secondary | ICD-10-CM | POA: Diagnosis not present

## 2019-08-05 DIAGNOSIS — I4821 Permanent atrial fibrillation: Secondary | ICD-10-CM

## 2019-08-05 DIAGNOSIS — I509 Heart failure, unspecified: Secondary | ICD-10-CM

## 2019-08-05 DIAGNOSIS — I428 Other cardiomyopathies: Secondary | ICD-10-CM

## 2019-08-05 DIAGNOSIS — Z950 Presence of cardiac pacemaker: Secondary | ICD-10-CM | POA: Diagnosis not present

## 2019-08-05 DIAGNOSIS — N184 Chronic kidney disease, stage 4 (severe): Secondary | ICD-10-CM

## 2019-08-05 DIAGNOSIS — I5042 Chronic combined systolic (congestive) and diastolic (congestive) heart failure: Secondary | ICD-10-CM | POA: Diagnosis not present

## 2019-08-05 MED ORDER — FUROSEMIDE 80 MG PO TABS: ORAL_TABLET | ORAL | 3 refills | Status: DC

## 2019-08-05 NOTE — Patient Instructions (Addendum)
Medication Instructions:  Stop metoprolol Stop losartan Check blood pressure daily, please contact us if it remains 80s/50s  *If you need a refill on your cardiac medications before your next appointment, please call your pharmacy*  Lab Work: None  Testing/Procedures: Echocardiogram  Follow-Up: Referred to device clinic, advanced heart failure clinic

## 2019-08-05 NOTE — Progress Notes (Signed)
Cardiology Office Note:    Date:  08/05/2019   ID:  COLLYN RIBAS, DOB 05-31-1946, MRN [id number]  PCP:  [name], [name], MD  Cardiologist:  Buford Dresser, MD  Referring MD: [name], [name], MD   CC: New patient evaluation for heart failure  History of Present Illness:    Charles Mcmahon is a 73 y.o. 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  Cardiac history: Fine until a few years ago, until he was having low energy. Found to have EF of ~35%. Does not have CAD that he is aware of. Has [place] monitoring system for his St Jude device  Has had medications changed around a lot. Does not feel he has improved at all on meds. Kidneys are borderline, has been limiting in terms of his medications. Has a nephrologist here, Dr. Moshe Cipro with [facility], who has been managing his medications. Last labs I have are from 07/02/19, Cr 2.38, K 3.4.  BP runs low, frequently lightheaded. No syncope. Has been low for months. The last note I can see in Care Everywhere, his BP was 102/64 (02/27/19). When he saw Dr. [name] on 05/27/19, it was 80/50. On 06/17/19, it was 82/54.  Meds: furosemide 160 mg AM/80 mg PM, metolazone 2.5 mg as needed (about every other day), losartan 12.5 mg daily (has been cut down), metoprolol (thinks it is 12.5 mg daily)  Doesn't weigh himself daily, weight is the lowest it has been in many years. Appetite is fine, feels that he eats well. Watches salt intake closely. Watches fluid level closely, though balances between kidney issues and heart failure.   Breathing is excellent, no issues. Very minimal functional capacity, can barely walk around the store  to shop.  Denies chest pain, shortness of breath at rest or with normal exertion. No PND, orthopnea, or unexpected weight gain that he knows of. No syncope or palpitations.  Moved from [place], prior cardiologist: Woodward Ku, MD [address] [place] [address] In [facility]  I was able to request records after the visit from [facility], now in [place]. My summary of records is below: -BiV/CRT upgrade 03/20/2017, declined ICD at that point -chronic systolic heart failure, nonischemic cardiomyopathy, thought to be 2/2 long term RV pacing -chronic atrial fibrillation, requires no medical management of heart rate as he is pacer dependent -long term anticoagulation with coumadin (I cannot see that a DOAC was tried) -reported nonsustained VT, none note at his visit 03/27/2018  From Note 03/27/18 (quotation from note)  Cardiac Catheterization Report: Normal coronary arteries. Moderately severe LV dysfunction. Estimated EF 35%. Trivial MR. Pressures: RA mean 17, RV 68/11, PA 65/32, PCW mean 41 - 02/12/2013  Echocardiogram Report: Images were obtained in slow afib and rate of 70bpm and with 100% biventricular pacing. LVEF (40%). No regional wall motion abnormalities. Diastolic dysfunction. Severe left ventricular hypertrophy and mild left atrial enlargement suggest hypertensive heart disease. Mildly reduced right ventricular function. Mild MR. Mild-moderate TR. No pulmonary hypertension, pulmonary artery systolic pressure [id number]. Mildly dilated IVC diameter. Small to moderate sized pericardial effusion without any obvious hemodynamic significance. Pacing wires seen in RA and RV. There is echocardiogram  evidence for mild systolic congestive heart failure at the time of this study. Compared with prior echocardiogram report of 03/07/2017, the LVEF may have improved slightly. - 09/21/2017  Pacemaker Information Report: On 04/12/2009 he had a dual chamber  right sided pacer implant for AV block. Due to AV Block, pacer dependent, non-ischemic cardiomyopathy, CHF, LVEF of 35%, this was upgraded to a new biventricular pacing system on 03/20/2017. A bi-ventricular (RA - RV - LV) pacemaker was implanted at [facility] by Dr. Tracie Harrier. Alejandro Mulling Jude Model: [id number] Ser No: [id number] Follow-up Method: [facility] Jude) - 03/20/2017 Last Pacemaker Check: Patient's bi-ventricular (RA - RV - LV) St Jude pacemaker was interrogated on 12/20/2017. Remote interrogation was performed. Battery level: good. Patient is pacer dependant. Underlying rhythm: complete heart block, slow afib. The patient is ventricularly paced 97 % of the time with the setting of VVI-R - 70. There were 0 ventricular episodes. Normal bi-ventricular (RA - RV - LV) pacemaker function. - 12/20/2017    Past Medical History:  Diagnosis Date   Allergy    Arthritis    Asthma    Cancer ([place])    Cardiac arrhythmia due to congenital heart disease    Coronary artery disease    Heart disease    Heart disease    Heart murmur    Hx: UTI (urinary tract infection)     Past Surgical History:  Procedure Laterality Date   CHOLECYSTECTOMY  2008   HERNIA REPAIR  2019   right side anguler   MELANOMA EXCISION Right 1993   right arm   PACEMAKER IMPLANT     REPLACEMENT TOTAL KNEE Right     Current Medications: Current Outpatient Medications on File Prior to Visit  Medication Sig   albuterol (VENTOLIN HFA) 108 (90 Base) MCG/ACT inhaler Inhale 2 puffs into the lungs as needed.   ATROVENT HFA 17 MCG/ACT inhaler Inhale 2 puffs into the lungs 2 (two) times daily.   finasteride (PROSCAR) 5 MG tablet Take by mouth daily.   furosemide (LASIX) 80 MG tablet Take 1 tablet (80 mg total) by mouth 2 (two) times daily.   losartan (COZAAR) 50 MG tablet Take by mouth daily.   metolazone (ZAROXOLYN) 5 MG tablet Take 1 tablet (5 mg total) by mouth daily as needed.   metoprolol succinate  (TOPROL-XL) 25 MG 24 hr tablet Take 1 tablet (25 mg total) by mouth daily.   warfarin (COUMADIN) 2.5 MG tablet Take by mouth daily.   COLCRYS 0.6 MG tablet    No current facility-administered medications on file prior to visit.      Allergies:   Patient has no known allergies.   Social History   Tobacco Use   Smoking status: Former Smoker   Smokeless tobacco: Never Used  Substance Use Topics   Alcohol use: Yes   Drug use: Never    Family History: family history includes Arthritis in his mother.  ROS:   Please see the history of present illness.  Additional pertinent ROS: Constitutional: Negative for chills, fever, night sweats. Positive for unintentional weight loss. HENT: Negative for ear pain and hearing loss.   Eyes: Negative for loss of vision and eye pain.  Respiratory: Negative for cough, sputum, wheezing.   Cardiovascular: See HPI. Gastrointestinal: Negative for abdominal pain, melena, and hematochezia.  Genitourinary: Negative for dysuria and hematuria.  Musculoskeletal: Negative for falls and myalgias.  Skin: Negative for itching and rash.  Neurological: Negative for focal weakness, focal sensory changes and loss  of consciousness.  Endo/Heme/Allergies: Does bruise/bleed easily.     EKGs/Labs/Other Studies Reviewed:    The following studies were reviewed today: See HPI  EKG:  EKG is personally reviewed.  The ekg ordered today demonstrates BiV paced, permanent afib, CHB.  Recent Labs: 05/27/2019: ALT 17; Hemoglobin 12.6; Platelets 288.0; TSH 5.92 06/17/2019: BUN 92; Creatinine, Ser 2.63; Potassium 3.4; Pro B Natriuretic peptide (BNP) 661.0; Sodium 137  Recent Lipid Panel No results found for: CHOL, TRIG, HDL, CHOLHDL, VLDL, LDLCALC, LDLDIRECT  Physical Exam:    VS:  BP (!) 84/53    Pulse 70    Ht 5\' 9"  (1.753 m)    Wt 176 lb (79.8 kg)    SpO2 100%    BMI 25.99 kg/m     Wt Readings from Last 3 Encounters:  08/05/19 176 lb (79.8 kg)  06/17/19 194 lb  (88 kg)  05/27/19 208 lb 4 oz (94.5 kg)    GEN: frail appearing, in NAD HEENT: Normal, moist mucous membranes NECK: No JVD visible at 90 degrees. CARDIAC: regular rhythm, normal S1 and S2, no rubs, gallops. 2/6 SM VASCULAR: Radial and DP pulses 2+ bilaterally. No carotid bruits RESPIRATORY:  Clear to auscultation without rales, wheezing or rhonchi  ABDOMEN: Soft, non-tender, non-distended MUSCULOSKELETAL:  Ambulates independently SKIN: Warm and dry, 1+ bilateral LE edema NEUROLOGIC:  Alert and oriented x 3. No focal neuro deficits noted. PSYCHIATRIC:  Normal affect    ASSESSMENT:    1. 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 Chronic combined systolic and diastolic heart failure (HCC)   3. Heart block AV complete (HCC)   4. Cardiac resynchronization therapy pacemaker (CRT-P) in place   5. Permanent atrial fibrillation ([place])   6. Nonischemic cardiomyopathy ([place])   7. CKD (chronic kidney disease) stage 4, GFR 15-29 ml/min (HCC)    PLAN:    Chronic systolic and diastolic heart failure, nonischemic cardiomyopathy, refractory to medical treatment: -with his very low blood pressures, I am concerned about this having progressed -NYHA class 3-4 symptoms -hypotensive today though endorses it has been this way for months. Will stop losartan and metoprolol -counseled on red flag warning signs, when to come to ER for evaluation. Contemplated admission today but as he has been stable for months, will follow closely -echo given his progression of HF -will refer to the advanced heart failure clinic given the inability to get him on goal directed medical therapy -continues to have LE edema. Continue current diuretic regimen  Chronic kidney disease, stage 4: -complicates medical therapy -followed by Dr. Moshe Cipro at [facility], message sent today.  Permanent atrial fibrillation, complete AV block, with CRT-P (St Jude) -referred to device clinic for long term management -on coumadin. Renal  function may make DOAC difficult but I would prefer apixaban 2.5 mg BID if possible. Will address at follow up. -CHA2DS2/VAS Stroke Risk Points=3  This is a difficult position. He has multiple comorbid illnesses and hypotension, though he is ambulatory with this. His kidney disease, heart failure, and hypotension limit medical therapy. Complex medical decision making.  Plan for follow up: needs follow up with advanced heart failure clinic given refractory HF and inability to tolerate meds. I will follow him closely until he can be seen by them.  Medication Adjustments/Labs and Tests Ordered: Current medicines are reviewed at length with the patient today.  Concerns regarding medicines are outlined above.  Orders Placed This Encounter  Procedures   AMB referral to CHF clinic   Ambulatory referral to Cardiac  Electrophysiology   EKG 12-Lead   ECHOCARDIOGRAM COMPLETE   Meds ordered this encounter  Medications   furosemide (LASIX) 80 MG tablet    Sig: Take 2 tablets (160 mg total) by mouth every morning AND 1 tablet (80 mg total) every evening.    Dispense:  60 tablet    Refill:  3    Patient Instructions  Medication Instructions:  Stop metoprolol Stop losartan Check blood pressure daily, please contact us if it remains 80s/50s  *If you need a refill on your cardiac medications before your next appointment, please call your pharmacy*  Lab Work: None  Testing/Procedures: Echocardiogram  Follow-Up: Referred to device clinic, advanced heart failure clinic    Signed, Buford Dresser, MD PhD 08/05/2019   [place]

## 2019-08-07 ENCOUNTER — Encounter: Payer: Self-pay | Admitting: Cardiology

## 2019-08-07 DIAGNOSIS — I43 Cardiomyopathy in diseases classified elsewhere: Secondary | ICD-10-CM | POA: Insufficient documentation

## 2019-08-07 DIAGNOSIS — I442 Atrioventricular block, complete: Secondary | ICD-10-CM | POA: Insufficient documentation

## 2019-08-07 DIAGNOSIS — I428 Other cardiomyopathies: Secondary | ICD-10-CM | POA: Insufficient documentation

## 2019-08-07 DIAGNOSIS — N184 Chronic kidney disease, stage 4 (severe): Secondary | ICD-10-CM | POA: Insufficient documentation

## 2019-08-07 DIAGNOSIS — Z950 Presence of cardiac pacemaker: Secondary | ICD-10-CM | POA: Insufficient documentation

## 2019-08-07 DIAGNOSIS — I509 Heart failure, unspecified: Secondary | ICD-10-CM | POA: Insufficient documentation

## 2019-08-08 ENCOUNTER — Ambulatory Visit (INDEPENDENT_AMBULATORY_CARE_PROVIDER_SITE_OTHER): Payer: Medicare Other | Admitting: General Practice

## 2019-08-08 DIAGNOSIS — Z7901 Long term (current) use of anticoagulants: Secondary | ICD-10-CM

## 2019-08-08 LAB — POCT INR: INR: 3.1 — AB (ref 2.0–3.0)

## 2019-08-08 NOTE — Patient Instructions (Addendum)
Pre visit review using our clinic review tool, if applicable. No additional management support is needed unless otherwise documented below in the visit note.  Skip dosage today and then continue taking 1 tablet daily except 1/2 tablet on Monday Wed and Fridays.  Re-check in 2 weeks. Patient is using [employer] Connected home monitoring.  Patient verbalized dosing instructions.

## 2019-08-08 NOTE — Progress Notes (Signed)
Medical screening examination/treatment/procedure(s) were performed by non-physician practitioner and as supervising physician I was immediately available for consultation/collaboration. I agree with above. James John, MD   

## 2019-08-12 ENCOUNTER — Telehealth: Payer: Self-pay | Admitting: Cardiology

## 2019-08-12 ENCOUNTER — Telehealth (HOSPITAL_COMMUNITY): Payer: Self-pay | Admitting: Vascular Surgery

## 2019-08-12 NOTE — Telephone Encounter (Signed)
Left a message for the patient to call back.  

## 2019-08-12 NOTE — Telephone Encounter (Signed)
° ° °  Pt c/o BP issue: STAT if pt c/o blurred vision, one-sided weakness or slurred speech  1. What are your last 5 BP readings?  85/60 88/53 106/64 109/67  2. Are you having any other symptoms (ex. Dizziness, headache, blurred vision, passed out)? NO   3. What is your BP issue? Patient calling to report BP

## 2019-08-12 NOTE — Telephone Encounter (Signed)
Left pt 4 messages to make NEW PT APPT W/ DB

## 2019-08-13 ENCOUNTER — Telehealth (INDEPENDENT_AMBULATORY_CARE_PROVIDER_SITE_OTHER): Payer: Medicare Other | Admitting: Adult Health

## 2019-08-13 ENCOUNTER — Other Ambulatory Visit: Payer: Self-pay

## 2019-08-13 DIAGNOSIS — R110 Nausea: Secondary | ICD-10-CM

## 2019-08-13 DIAGNOSIS — R197 Diarrhea, unspecified: Secondary | ICD-10-CM

## 2019-08-13 MED ORDER — ONDANSETRON HCL 4 MG PO TABS: 4.0000 mg | ORAL_TABLET | Freq: Three times a day (TID) | ORAL | 0 refills | Status: DC | PRN

## 2019-08-13 NOTE — Progress Notes (Signed)
Virtual Visit via Telephone Note  I connected with Charles Mcmahon on 08/13/19 at 11:00 AM EDT by telephone and verified that I am speaking with the correct person using two identifiers.   I discussed the limitations, risks, security and privacy concerns of performing an evaluation and management service by telephone and the availability of in person appointments. I also discussed with the patient that there may be a patient responsible charge related to this service. The patient expressed understanding and agreed to proceed.  Location patient: home Location provider: work or home office Participants present for the call: patient, provider Patient did not have a visit in the prior 7 days to address this/these issue(s).   History of Present Illness: 73 year old male who  has a past medical history of Allergy, Arthritis, Asthma, Cancer ([place]), Cardiac arrhythmia due to congenital heart disease, Coronary artery disease, Heart disease, Heart disease, Heart murmur, and UTI (urinary tract infection).  Patient is new to me, he was unable to see his PCP today.  Being evaluated for an acute issue of diarrhea and nausea.  Per patient, have had diarrhea and nausea for the last 3 weeks.  I have been living on Pepto-Bismol.  Pepto helps for short time but the diarrhea and nausea comes back.".  Reports that he has been taken off a lot of his blood pressure medications because his blood pressure was running low, and is supposed to be having a follow-up appointment on Monday with cardiology?  He denies fevers, chills, vomiting, blood in stool.   Observations/Objective: Patient sounds cheerful and well on the phone. I do not appreciate any SOB. Speech and thought processing are grossly intact. Patient reported vitals:  Assessment and Plan: He has a significant medical history of cardiac disease as well as renal disease.  Advised one half tab of Imodium every 8 hours as needed for the next 24 hours.  If  symptoms do not improve then needs to follow-up with his PCP or the emergency room.  Will send in Zofran for short course to help with nausea.  Red flags reviewed and follow-up precautions noted  Follow Up Instructions:  I did not refer this patient for an OV in the next 24 hours for this/these issue(s).  I discussed the assessment and treatment plan with the patient. The patient was provided an opportunity to ask questions and all were answered. The patient agreed with the plan and demonstrated an understanding of the instructions.   The patient was advised to call back or seek an in-person evaluation if the symptoms worsen or if the condition fails to improve as anticipated.  I provided 20  minutes of non-face-to-face time during this encounter.   Dorothyann Peng, NP

## 2019-08-14 ENCOUNTER — Other Ambulatory Visit (HOSPITAL_COMMUNITY): Payer: Medicare Other

## 2019-08-15 ENCOUNTER — Telehealth (HOSPITAL_COMMUNITY): Payer: Self-pay

## 2019-08-15 ENCOUNTER — Encounter (HOSPITAL_COMMUNITY): Payer: Self-pay | Admitting: Cardiology

## 2019-08-15 ENCOUNTER — Telehealth (HOSPITAL_COMMUNITY): Payer: Self-pay | Admitting: Vascular Surgery

## 2019-08-15 NOTE — Telephone Encounter (Signed)
New message   Just an FYI. We have made several attempts to contact this patient including sending a letter to schedule or reschedule their echocardiogram. We will be removing the patient from the echo WQ.   10.30.20 mail reminder letter Frederik Standley   10.29.20 no show   10.22.20 @ 1:48pm lm on home vm - Erykah Lippert

## 2019-08-15 NOTE — Telephone Encounter (Signed)
LM2CB 

## 2019-08-15 NOTE — Telephone Encounter (Signed)
Left pt message to make new pt appt w/ db , 5th attempt

## 2019-08-19 ENCOUNTER — Encounter (HOSPITAL_COMMUNITY): Payer: Self-pay | Admitting: Vascular Surgery

## 2019-08-19 ENCOUNTER — Telehealth (HOSPITAL_COMMUNITY): Payer: Self-pay | Admitting: Vascular Surgery

## 2019-08-19 NOTE — Telephone Encounter (Signed)
Made several attempts t call pt to schedule new pt appt w/ DB, SENT PT LETTER TO CONTACT OFFICE TO MAKE APPT

## 2019-08-26 ENCOUNTER — Ambulatory Visit (INDEPENDENT_AMBULATORY_CARE_PROVIDER_SITE_OTHER): Payer: Medicare Other | Admitting: General Practice

## 2019-08-26 ENCOUNTER — Telehealth: Payer: Self-pay | Admitting: *Deleted

## 2019-08-26 DIAGNOSIS — Z7901 Long term (current) use of anticoagulants: Secondary | ICD-10-CM

## 2019-08-26 LAB — POCT INR: INR: 6.4 — AB (ref 2.0–3.0)

## 2019-08-26 NOTE — Patient Instructions (Signed)
Pre visit review using our clinic review tool, if applicable. No additional management support is needed unless otherwise documented below in the visit note.  Hold dosage through Thursday and re-check INR on Friday!   Patient is using [employer] Connected home monitoring.  Patient verbalized dosing instructions.

## 2019-08-26 NOTE — Telephone Encounter (Signed)
Please advise 

## 2019-08-26 NOTE — Telephone Encounter (Signed)
[employer] representative : Coleandra   Calling to report abnormal lab value: INR 6.4 today  ( per chart office has received fax report on this)

## 2019-08-26 NOTE — Telephone Encounter (Signed)
It seems like Coumadin has already been adjust by Caren Griffins. Avigayil Ton [name], MD

## 2019-08-26 NOTE — Progress Notes (Addendum)
Medical screening examination/treatment/procedure(s) were performed by non-physician practitioner and as supervising physician I was immediately available for consultation/collaboration. I agree with above. Cathlean Cower, MD  I have reviewed available documentation from this visit and I agree with recommendations given.  [name]. [name], MD  [facility]. [place] office.

## 2019-08-29 ENCOUNTER — Other Ambulatory Visit (HOSPITAL_COMMUNITY): Payer: Medicare Other

## 2019-08-29 ENCOUNTER — Ambulatory Visit (INDEPENDENT_AMBULATORY_CARE_PROVIDER_SITE_OTHER): Payer: Medicare Other | Admitting: General Practice

## 2019-08-29 DIAGNOSIS — Z7901 Long term (current) use of anticoagulants: Secondary | ICD-10-CM

## 2019-08-29 LAB — POCT INR: INR: 2.3 (ref 2.0–3.0)

## 2019-08-29 NOTE — Progress Notes (Signed)
Medical screening examination/treatment/procedure(s) were performed by non-physician practitioner and as supervising physician I was immediately available for consultation/collaboration. I agree with above. James John, MD   

## 2019-08-29 NOTE — Patient Instructions (Signed)
Pre visit review using our clinic review tool, if applicable. No additional management support is needed unless otherwise documented below in the visit note.  Continue to take 2.5 mg daily except 1.25 mg on Mon Wed and Friday.  Re-check in 1 week.     Patient is using [employer] Connected home monitoring.  Patient verbalized dosing instructions.

## 2019-09-02 ENCOUNTER — Telehealth (HOSPITAL_COMMUNITY): Payer: Self-pay

## 2019-09-02 NOTE — Telephone Encounter (Signed)
PT left voicemail stating that he would like to cancel all future appts with [place] except for his PCP appts.

## 2019-09-05 ENCOUNTER — Ambulatory Visit (INDEPENDENT_AMBULATORY_CARE_PROVIDER_SITE_OTHER): Payer: Medicare Other | Admitting: General Practice

## 2019-09-05 DIAGNOSIS — Z7901 Long term (current) use of anticoagulants: Secondary | ICD-10-CM | POA: Diagnosis not present

## 2019-09-05 LAB — POCT INR: INR: 4.1 — AB (ref 2.0–3.0)

## 2019-09-05 NOTE — Patient Instructions (Signed)
Pre visit review using our clinic review tool, if applicable. No additional management support is needed unless otherwise documented below in the visit note.  Connected hoHold coumadin today and tomorrow and then change dosage and take  2.5 mg daily except 1.25 mg on Mon Wed and Friday and Saturday.   Re-check in 2 weeks.     Patient is using [employer] me monitoring.  Patient verbalized dosing instructions.

## 2019-09-05 NOTE — Progress Notes (Signed)
Medical screening examination/treatment/procedure(s) were performed by non-physician practitioner and as supervising physician I was immediately available for consultation/collaboration. I agree with above. Cindi Ghazarian, MD   

## 2019-09-07 ENCOUNTER — Encounter (HOSPITAL_COMMUNITY): Payer: Self-pay | Admitting: *Deleted

## 2019-09-07 ENCOUNTER — Observation Stay (HOSPITAL_COMMUNITY)
Admission: EM | Admit: 2019-09-07 | Discharge: 2019-09-09 | Disposition: A | Discharge status: Home / Self Care | Payer: Medicare Other | Attending: Internal Medicine | Admitting: Internal Medicine

## 2019-09-07 ENCOUNTER — Other Ambulatory Visit: Payer: Self-pay

## 2019-09-07 ENCOUNTER — Emergency Department (HOSPITAL_COMMUNITY): Payer: Medicare Other

## 2019-09-07 DIAGNOSIS — Z7901 Long term (current) use of anticoagulants: Secondary | ICD-10-CM | POA: Diagnosis not present

## 2019-09-07 DIAGNOSIS — E43 Unspecified severe protein-calorie malnutrition: Secondary | ICD-10-CM | POA: Diagnosis not present

## 2019-09-07 DIAGNOSIS — N184 Chronic kidney disease, stage 4 (severe): Secondary | ICD-10-CM | POA: Diagnosis not present

## 2019-09-07 DIAGNOSIS — Z87440 Personal history of urinary (tract) infections: Secondary | ICD-10-CM | POA: Insufficient documentation

## 2019-09-07 DIAGNOSIS — R748 Abnormal levels of other serum enzymes: Secondary | ICD-10-CM | POA: Diagnosis present

## 2019-09-07 DIAGNOSIS — Z96651 Presence of right artificial knee joint: Secondary | ICD-10-CM | POA: Insufficient documentation

## 2019-09-07 DIAGNOSIS — M1990 Unspecified osteoarthritis, unspecified site: Secondary | ICD-10-CM | POA: Insufficient documentation

## 2019-09-07 DIAGNOSIS — J45909 Unspecified asthma, uncomplicated: Secondary | ICD-10-CM | POA: Diagnosis not present

## 2019-09-07 DIAGNOSIS — Z888 Allergy status to other drugs, medicaments and biological substances status: Secondary | ICD-10-CM | POA: Insufficient documentation

## 2019-09-07 DIAGNOSIS — I119 Hypertensive heart disease without heart failure: Secondary | ICD-10-CM | POA: Diagnosis present

## 2019-09-07 DIAGNOSIS — I130 Hypertensive heart and chronic kidney disease with heart failure and stage 1 through stage 4 chronic kidney disease, or unspecified chronic kidney disease: Secondary | ICD-10-CM | POA: Insufficient documentation

## 2019-09-07 DIAGNOSIS — I313 Pericardial effusion (noninflammatory): Secondary | ICD-10-CM | POA: Diagnosis not present

## 2019-09-07 DIAGNOSIS — R410 Disorientation, unspecified: Secondary | ICD-10-CM | POA: Diagnosis present

## 2019-09-07 DIAGNOSIS — I081 Rheumatic disorders of both mitral and tricuspid valves: Secondary | ICD-10-CM | POA: Diagnosis not present

## 2019-09-07 DIAGNOSIS — R4789 Other speech disturbances: Secondary | ICD-10-CM | POA: Diagnosis not present

## 2019-09-07 DIAGNOSIS — Z79899 Other long term (current) drug therapy: Secondary | ICD-10-CM | POA: Insufficient documentation

## 2019-09-07 DIAGNOSIS — M109 Gout, unspecified: Secondary | ICD-10-CM | POA: Diagnosis not present

## 2019-09-07 DIAGNOSIS — Z9114 Patient's other noncompliance with medication regimen: Secondary | ICD-10-CM | POA: Insufficient documentation

## 2019-09-07 DIAGNOSIS — Z8261 Family history of arthritis: Secondary | ICD-10-CM | POA: Insufficient documentation

## 2019-09-07 DIAGNOSIS — I4821 Permanent atrial fibrillation: Secondary | ICD-10-CM | POA: Diagnosis present

## 2019-09-07 DIAGNOSIS — G9340 Encephalopathy, unspecified: Secondary | ICD-10-CM | POA: Diagnosis present

## 2019-09-07 DIAGNOSIS — G459 Transient cerebral ischemic attack, unspecified: Secondary | ICD-10-CM | POA: Diagnosis not present

## 2019-09-07 DIAGNOSIS — Z20828 Contact with and (suspected) exposure to other viral communicable diseases: Secondary | ICD-10-CM | POA: Diagnosis not present

## 2019-09-07 DIAGNOSIS — Z23 Encounter for immunization: Secondary | ICD-10-CM | POA: Insufficient documentation

## 2019-09-07 DIAGNOSIS — I428 Other cardiomyopathies: Secondary | ICD-10-CM | POA: Insufficient documentation

## 2019-09-07 DIAGNOSIS — I442 Atrioventricular block, complete: Secondary | ICD-10-CM | POA: Diagnosis not present

## 2019-09-07 DIAGNOSIS — I2510 Atherosclerotic heart disease of native coronary artery without angina pectoris: Secondary | ICD-10-CM | POA: Diagnosis not present

## 2019-09-07 DIAGNOSIS — E876 Hypokalemia: Secondary | ICD-10-CM | POA: Insufficient documentation

## 2019-09-07 DIAGNOSIS — I509 Heart failure, unspecified: Secondary | ICD-10-CM

## 2019-09-07 DIAGNOSIS — E785 Hyperlipidemia, unspecified: Secondary | ICD-10-CM | POA: Diagnosis not present

## 2019-09-07 DIAGNOSIS — Z85820 Personal history of malignant melanoma of skin: Secondary | ICD-10-CM | POA: Diagnosis not present

## 2019-09-07 DIAGNOSIS — E854 Organ-limited amyloidosis: Secondary | ICD-10-CM

## 2019-09-07 DIAGNOSIS — Z9049 Acquired absence of other specified parts of digestive tract: Secondary | ICD-10-CM | POA: Insufficient documentation

## 2019-09-07 DIAGNOSIS — Z950 Presence of cardiac pacemaker: Secondary | ICD-10-CM | POA: Diagnosis present

## 2019-09-07 DIAGNOSIS — M1A9XX1 Chronic gout, unspecified, with tophus (tophi): Secondary | ICD-10-CM

## 2019-09-07 DIAGNOSIS — Z87891 Personal history of nicotine dependence: Secondary | ICD-10-CM | POA: Insufficient documentation

## 2019-09-07 LAB — COMPREHENSIVE METABOLIC PANEL
ALT: 27 U/L (ref 0–44)
AST: 43 U/L — ABNORMAL HIGH (ref 15–41)
Albumin: 2.6 g/dL — ABNORMAL LOW (ref 3.5–5.0)
Alkaline Phosphatase: 446 U/L — ABNORMAL HIGH (ref 38–126)
Anion gap: 13 (ref 5–15)
BUN: 30 mg/dL — ABNORMAL HIGH (ref 8–23)
CO2: 27 mmol/L (ref 22–32)
Calcium: 8.8 mg/dL — ABNORMAL LOW (ref 8.9–10.3)
Chloride: 93 mmol/L — ABNORMAL LOW (ref 98–111)
Creatinine, Ser: 1.8 mg/dL — ABNORMAL HIGH (ref 0.61–1.24)
GFR calc Af Amer: 42 mL/min — ABNORMAL LOW (ref >60)
GFR calc non Af Amer: 36 mL/min — ABNORMAL LOW (ref >60)
Glucose, Bld: 125 mg/dL — ABNORMAL HIGH (ref 70–99)
Potassium: 2.9 mmol/L — ABNORMAL LOW (ref 3.5–5.1)
Sodium: 133 mmol/L — ABNORMAL LOW (ref 135–145)
Total Bilirubin: 1.2 mg/dL (ref 0.3–1.2)
Total Protein: 5.4 g/dL — ABNORMAL LOW (ref 6.5–8.1)

## 2019-09-07 LAB — CBC
HCT: 41.4 % (ref 39.0–52.0)
Hemoglobin: 13.6 g/dL (ref 13.0–17.0)
MCH: 31 pg (ref 26.0–34.0)
MCHC: 32.9 g/dL (ref 30.0–36.0)
MCV: 94.3 fL (ref 80.0–100.0)
Platelets: 286 10*3/uL (ref 150–400)
RBC: 4.39 MIL/uL (ref 4.22–5.81)
RDW: 16.2 % — ABNORMAL HIGH (ref 11.5–15.5)
WBC: 5.6 10*3/uL (ref 4.0–10.5)
nRBC: 0 % (ref 0.0–0.2)

## 2019-09-07 LAB — APTT: aPTT: 36 seconds (ref 24–36)

## 2019-09-07 LAB — DIFFERENTIAL
Abs Immature Granulocytes: 0.02 10*3/uL (ref 0.00–0.07)
Basophils Absolute: 0.1 10*3/uL (ref 0.0–0.1)
Basophils Relative: 1 %
Eosinophils Absolute: 0.1 10*3/uL (ref 0.0–0.5)
Eosinophils Relative: 3 %
Immature Granulocytes: 0 %
Lymphocytes Relative: 10 %
Lymphs Abs: 0.5 10*3/uL — ABNORMAL LOW (ref 0.7–4.0)
Monocytes Absolute: 0.5 10*3/uL (ref 0.1–1.0)
Monocytes Relative: 9 %
Neutro Abs: 4.3 10*3/uL (ref 1.7–7.7)
Neutrophils Relative %: 77 %

## 2019-09-07 LAB — I-STAT CHEM 8, ED
BUN: 31 mg/dL — ABNORMAL HIGH (ref 8–23)
Calcium, Ion: 1.04 mmol/L — ABNORMAL LOW (ref 1.15–1.40)
Chloride: 92 mmol/L — ABNORMAL LOW (ref 98–111)
Creatinine, Ser: 1.7 mg/dL — ABNORMAL HIGH (ref 0.61–1.24)
Glucose, Bld: 121 mg/dL — ABNORMAL HIGH (ref 70–99)
HCT: 43 % (ref 39.0–52.0)
Hemoglobin: 14.6 g/dL (ref 13.0–17.0)
Potassium: 2.9 mmol/L — ABNORMAL LOW (ref 3.5–5.1)
Sodium: 131 mmol/L — ABNORMAL LOW (ref 135–145)
TCO2: 31 mmol/L (ref 22–32)

## 2019-09-07 LAB — CBG MONITORING, ED: Glucose-Capillary: 130 mg/dL — ABNORMAL HIGH (ref 70–99)

## 2019-09-07 LAB — PROTIME-INR
INR: 2.2 — ABNORMAL HIGH (ref 0.8–1.2)
Prothrombin Time: 24.2 seconds — ABNORMAL HIGH (ref 11.4–15.2)

## 2019-09-07 MED ORDER — WARFARIN SODIUM 2.5 MG PO TABS
2.5000 mg | ORAL_TABLET | Freq: Once | ORAL | Status: AC
  Administered 2019-09-08: 01:00:00 2.5 mg via ORAL
  Filled 2019-09-07: qty 1

## 2019-09-07 MED ORDER — WARFARIN - PHARMACIST DOSING INPATIENT
Freq: Every day | Status: DC
  Administered 2019-09-08: 18:00:00

## 2019-09-07 MED ORDER — SODIUM CHLORIDE 0.9% FLUSH
3.0000 mL | Freq: Once | INTRAVENOUS | Status: DC
Start: 2019-09-07 — End: 2019-09-09

## 2019-09-07 MED ORDER — POTASSIUM CHLORIDE CRYS ER 20 MEQ PO TBCR
40.0000 meq | EXTENDED_RELEASE_TABLET | Freq: Once | ORAL | Status: AC
  Administered 2019-09-07: 21:00:00 40 meq via ORAL
  Filled 2019-09-07: qty 2

## 2019-09-07 MED ORDER — LORAZEPAM 2 MG/ML IJ SOLN
0.5000 mg | Freq: Once | INTRAMUSCULAR | Status: AC
  Administered 2019-09-07: 21:00:00 0.5 mg via INTRAVENOUS
  Filled 2019-09-07: qty 1

## 2019-09-07 NOTE — Progress Notes (Signed)
ANTICOAGULATION CONSULT NOTE - Initial Consult  Pharmacy Consult for Coumadin Indication: atrial fibrillation  Allergies  Allergen Reactions  . Gadolinium Derivatives Nausea And Vomiting    Reported by [facility] 04/23/2009    Patient Measurements: Height: 5\' 9"  (175.3 cm) Weight: 180 lb (81.6 kg) IBW/kg (Calculated) : 70.7  Vital Signs: Temp: 98.4 F (36.9 C) (11/22 1604) Temp Source: Oral (11/22 1604) BP: 113/71 (11/22 1930) Pulse Rate: 60 (11/22 1930)  Labs: Recent Labs    09/05/19 09/07/19 1624 09/07/19 1655  HGB  --  13.6 14.6  HCT  --  41.4 43.0  PLT  --  286  --   APTT  --  36  --   LABPROT  --  24.2*  --   INR 4.1* 2.2*  --   CREATININE  --  1.80* 1.70*    Estimated Creatinine Clearance: 38.1 mL/min (A) (by C-G formula based on SCr of 1.7 mg/dL (H)).   Medical History: Past Medical History:  Diagnosis Date  . Allergy   . Arthritis   . Asthma   . Cancer ([place])   . Cardiac arrhythmia due to congenital heart disease   . Coronary artery disease   . Heart disease   . Heart disease   . Heart murmur   . Hx: UTI (urinary tract infection)     Medications:  No current facility-administered medications on file prior to encounter.    Current Outpatient Medications on File Prior to Encounter  Medication Sig Dispense Refill  . acetaminophen (TYLENOL) 500 MG tablet Take 1,000 mg by mouth every 8 (eight) hours.    Marland Kitchen albuterol (VENTOLIN HFA) 108 (90 Base) MCG/ACT inhaler Inhale 2 puffs into the lungs every 6 (six) hours as needed for wheezing or shortness of breath.     . allopurinol (ZYLOPRIM) 100 MG tablet Take 100 mg by mouth 2 (two) times daily.    . finasteride (PROSCAR) 5 MG tablet Take 5 mg by mouth every morning.     . furosemide (LASIX) 80 MG tablet Take 80 mg by mouth 2 (two) times daily.    . metolazone (ZAROXOLYN) 5 MG tablet Take 1 tablet (5 mg total) by mouth daily as needed. (Patient taking differently: Take 5 mg by mouth daily as needed  (weight gain 5 lb overnight). ) 30 tablet 2  . OVER THE COUNTER MEDICATION Apply 1 application topically 3 (three) times daily as needed (pain). CBD Cream    . OVER THE COUNTER MEDICATION Place 1 drop into both eyes 2 (two) times daily as needed (itching/irritation). Similasin allergy eye relief    . warfarin (COUMADIN) 2.5 MG tablet Take 1.25-2.5 mg by mouth See admin instructions. Take one tablet (2.5 mg) by mouth on Sunday, Tuesday, Thursday at 7pm, take 1/2 tablet (1.25 mg) on Monday, Wednesday, Friday, Saturday at 7pm    . furosemide (LASIX) 80 MG tablet Take 2 tablets (160 mg total) by mouth every morning AND 1 tablet (80 mg total) every evening. (Patient not taking: Reported on 09/07/2019) 60 tablet 3  . ondansetron (ZOFRAN) 4 MG tablet Take 1 tablet (4 mg total) by mouth every 8 (eight) hours as needed for nausea or vomiting. (Patient not taking: Reported on 09/07/2019) 20 tablet 0  . [DISCONTINUED] COLCRYS 0.6 MG tablet       Assessment: 73 y.o. male admitted with AMS, possible CVA, H/O Afib, to continue Coumadin  Goal of Therapy:  INR 2-3 Monitor platelets by anticoagulation protocol: Yes   Plan:  Coumadin 2.5 mg tonight Daily INR  Caryl Pina 09/07/2019,11:29 PM

## 2019-09-07 NOTE — Consult Note (Addendum)
Neurology Consultation  Reason for Consult: Generalized weakness, altered mental status Referring Physician: Dr. Milton Ferguson  CC: Generalized weakness, altered mental status  History is obtained from: Patient, chart, patient's daughter at bedside  HPI: Charles Mcmahon is a 73 y.o. male past medical history of chronic atrial fibrillation on Coumadin, possible sleep apnea, restless leg, history of UTIs in the past, CKD, coronary artery disease, pacemaker placement for heart failure, presenting to the emergency room for evaluation of changes in mentation and generalized weakness. According to the patient and his daughter, the last time that he felt completely normal was 2 or 3 days ago.  He has not been feeling well-he describes that as being unable to have control of his body.  On further questioning, he seems to be having a difficult time explaining his symptoms although he was able to name and repeat normally, and has normal comprehension. The daughter said that he came for dinner tonight and was just not been acting like himself.  He could not use his phone or other electronic equipment that he otherwise uses with proficiency. The patient reports that he feels like he is not as sharp as he usually is.  He recently-7 months ago, moved to [place] to be closer to his daughter from [place]. His prior records from [place] from the [employer] system are in the care everywhere records and go as far back as 2018 but the daughter says that he has had worsening edema and problems with his kidney function since then. He is also not been compliant his medications because he wants to get off of all the medications as they do not make him feel good. Neurological consultation was obtained for change in mental status as well as complaints of not being able to feel her control his body although there was no focal weakness or sensory loss exhibited on objective exam. Denies any headaches.   Denies visual changes.  Denies tingling or numbness but reports inability to control his arms and legs the way he wants them to and feels that it takes time to do tasks that otherwise he would be able to do without thinking about.  LKW: 2 to 3 days ago tpa given?: no, outside window Premorbid modified Rankin scale (mRS): 1  ROS: A 14 point ROS was performed and is negative except as noted in the HPI.    Past Medical History:  Diagnosis Date  . Allergy   . Arthritis   . Asthma   . Cancer ([place])   . Cardiac arrhythmia due to congenital heart disease   . Coronary artery disease   . Heart disease   . Heart disease   . Heart murmur   . Hx: UTI (urinary tract infection)     Family History  Problem Relation Age of Onset  . Arthritis Mother    Social History:   reports that he has quit smoking. He has never used smokeless tobacco. He reports current alcohol use. He reports that he does not use drugs.  Medications  Current Facility-Administered Medications:  .  sodium chloride flush (NS) 0.9 % injection 3 mL, 3 mL, Intravenous, Once, Milton Ferguson, MD  Current Outpatient Medications:  .  albuterol (VENTOLIN HFA) 108 (90 Base) MCG/ACT inhaler, Inhale 2 puffs into the lungs as needed., Disp: , Rfl:  .  COLCRYS 0.6 MG tablet, , Disp: , Rfl:  .  finasteride (PROSCAR) 5 MG tablet, Take 5 mg by mouth daily., Disp: , Rfl:  .  furosemide (LASIX) 80 MG tablet, Take 2 tablets (160 mg total) by mouth every morning AND 1 tablet (80 mg total) every evening., Disp: 60 tablet, Rfl: 3 .  metolazone (ZAROXOLYN) 5 MG tablet, Take 1 tablet (5 mg total) by mouth daily as needed., Disp: 30 tablet, Rfl: 2 .  ondansetron (ZOFRAN) 4 MG tablet, Take 1 tablet (4 mg total) by mouth every 8 (eight) hours as needed for nausea or vomiting., Disp: 20 tablet, Rfl: 0 .  warfarin (COUMADIN) 2.5 MG tablet, Take by mouth daily., Disp: , Rfl:    Exam: Current vital signs: BP 113/71   Pulse 60   Temp 98.4 F (36.9  C) (Oral)   Resp 16   Ht 5\' 9"  (1.753 m)   Wt 81.6 kg   SpO2 98%   BMI 26.58 kg/m  Vital signs in last 24 hours: Temp:  [98.4 F (36.9 C)] 98.4 F (36.9 C) (11/22 1604) Pulse Rate:  [60-71] 60 (11/22 1930) Resp:  [10-17] 16 (11/22 1930) BP: (111-118)/(68-76) 113/71 (11/22 1930) SpO2:  [98 %-100 %] 98 % (11/22 1930) Weight:  [81.6 kg] 81.6 kg (11/22 1624) General: Awake alert in no distress HEENT: Normocephalic atraumatic Lungs: Clear to auscultation Cardiovascular: Regular rate rhythm Abdomen: Soft nondistended nontender Extremities: He has 1-2+ pitting edema in both lower and upper extremities. Neurological exam Awake alert oriented x3. Reduced attention concentration Naming intact.  Repetition intact.  Comprehension intact although he is slow to respond to questions. Cranial nerves: Pupils equal round react light, extraocular movements intact, visual fields full, facial sensation intact, face symmetric, tongue and palate midline. Motor exam: He is able to raise both his arms antigravity and is nearly symmetric 5/5 strength without any vertical drift.  Both lower extremities are also antigravity without any drift. Sensory exam: Intact light touch without extinction. Coordination: Intact finger-nose-finger testing Gait testing deferred at this time NIH stroke scale-0  Labs I have reviewed labs in epic and the results pertinent to this consultation are:  CBC    Component Value Date/Time   WBC 5.6 09/07/2019 1624   RBC 4.39 09/07/2019 1624   HGB 14.6 09/07/2019 1655   HCT 43.0 09/07/2019 1655   PLT 286 09/07/2019 1624   MCV 94.3 09/07/2019 1624   MCH 31.0 09/07/2019 1624   MCHC 32.9 09/07/2019 1624   RDW 16.2 (H) 09/07/2019 1624   LYMPHSABS 0.5 (L) 09/07/2019 1624   MONOABS 0.5 09/07/2019 1624   EOSABS 0.1 09/07/2019 1624   BASOSABS 0.1 09/07/2019 1624    CMP     Component Value Date/Time   NA 131 (L) 09/07/2019 1655   K 2.9 (L) 09/07/2019 1655   CL 92 (L)  09/07/2019 1655   CO2 27 09/07/2019 1624   GLUCOSE 121 (H) 09/07/2019 1655   BUN 31 (H) 09/07/2019 1655   CREATININE 1.70 (H) 09/07/2019 1655   CALCIUM 8.8 (L) 09/07/2019 1624   PROT 5.4 (L) 09/07/2019 1624   ALBUMIN 2.6 (L) 09/07/2019 1624   AST 43 (H) 09/07/2019 1624   ALT 27 09/07/2019 1624   ALKPHOS 446 (H) 09/07/2019 1624   BILITOT 1.2 09/07/2019 1624   GFRNONAA 36 (L) 09/07/2019 1624   GFRAA 42 (L) 09/07/2019 1624  INR 2.2  Imaging I have reviewed the images obtained: CT-scan of the brain-no acute changes  Assessment:  73 year old with past history as above, on Coumadin for chronic atrial fibrillation with therapeutic INR, presents to the emergency room for evaluation of changes in mentation where he  does not feel that he is able to do things as he would like.  Family also provides history of being unable to use electronic devices as he would be able to for the past day or 2 - ?could be apraxia due to frontal strokes. No focal cranial nerves, motor or sensory deficits noted on exam.  He is L be a little slow to respond to questions but no naming comprehension or repetition issues noted. Given his presentation, symptoms could be from some sort of encephalopathy versus scattered embolic strokes given the presence of atrial fibrillation. Although is on Coumadin, even with therapeutic INR, he is at high risk for having strokes. He has a pacemaker that is not MRI compatible hence an MRI cannot be done. I would recommend admitting him for stroke/encephalopathy work-up. He also has history of CKD-according to family has not been compliant to medications because of side effects.   Has been compliant with Coumadin.  Impression: Altered mental status-evaluate for embolic stroke due to sudden onset  Evaluate for other causes of encephalopathy.  Recommendations: Cannot get MRI brain Repeat head CT in 24 hours Renal function borderline-will not do CTA for now. Carotid  Dopplers Continue Coumadin for now If the repeat CT shows any evidence of an evolving stroke, might want to consider alternatives to Coumadin for anticoagulation 2D echocardiogram A1c Lipid panel Frequent neurochecks PT OT speech therapy Stroke swallow screen  Stroke team to follow  -- Amie Portland, MD [facility] Pager: [phone] If 7pm to 7am, please call on call as listed on AMION.

## 2019-09-07 NOTE — ED Triage Notes (Signed)
The pt according to the family has not been acting right  The pt reports a 3 day history of not being able to move his arms  He can answer the month  He knows Corey Skains is coming up  He knows the president  But he report that he feels strange  He lives with his mother who has had a stroke  So she is unable to tell when and if this all started the faamily has not seen him for a few days  He was seen on Wednesday or Thursday and was ok

## 2019-09-07 NOTE — ED Provider Notes (Signed)
[facility] EMERGENCY DEPARTMENT Provider Note   CSN: [id number] Arrival date & time: 09/07/19  1546     History   Chief Complaint Chief Complaint  Patient presents with   Altered Mental Status    HPI Charles Mcmahon is a 73 y.o. male.     Patient states that since Friday he has not been able to do normal tasks he cannot figure out how to do things.  His daughter states that this is not normal for him.  She spoke to him Friday morning and he was fine and she did not see him again till today  The history is provided by the patient and a relative. No language interpreter was used.  Altered Mental Status Presenting symptoms: behavior changes and confusion   Severity:  Moderate Most recent episode:  2 days ago Episode history:  Continuous Timing:  Constant Progression:  Unchanged Chronicity:  New Context: not alcohol use   Associated symptoms: no abdominal pain, no hallucinations, no headaches, no rash and no seizures     Past Medical History:  Diagnosis Date   Allergy    Arthritis    Asthma    Cancer ([place])    Cardiac arrhythmia due to congenital heart disease    Coronary artery disease    Heart disease    Heart disease    Heart murmur    Hx: UTI (urinary tract infection)     Patient Active Problem List   Diagnosis Date Noted   Chronic heart failure ([place]) 08/07/2019   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[place]) 08/07/2019   Heart block AV complete ([place]) 08/07/2019   Cardiac resynchronization therapy pacemaker (CRT-P) in place 08/07/2019   Nonischemic cardiomyopathy ([place]) 08/07/2019   CKD (chronic kidney disease) stage 4, GFR 15-29 ml/min ([place]) 08/07/2019   Hypertension with heart disease 06/17/2019   Elevated alkaline phosphatase level 06/17/2019   Kidney disease 06/17/2019   Chronic anticoagulation 06/17/2019   Long term (current) use of anticoagulants 06/17/2019   Permanent atrial fibrillation ([place]) 06/17/2019    Past  Surgical History:  Procedure Laterality Date   CHOLECYSTECTOMY  2008   HERNIA REPAIR  2019   right side anguler   MELANOMA EXCISION Right 1993   right arm   PACEMAKER IMPLANT     REPLACEMENT TOTAL KNEE Right         Home Medications    Prior to Admission medications   Medication Sig Start Date End Date Taking? Authorizing Provider  albuterol (VENTOLIN HFA) 108 (90 Base) MCG/ACT inhaler Inhale 2 puffs into the lungs as needed. 04/10/19   [provider]  COLCRYS 0.6 MG tablet  07/31/19   [provider]  finasteride (PROSCAR) 5 MG tablet Take 5 mg by mouth daily.    [provider]  furosemide (LASIX) 80 MG tablet Take 2 tablets (160 mg total) by mouth every morning AND 1 tablet (80 mg total) every evening. 08/05/19   Buford Dresser, MD  metolazone (ZAROXOLYN) 5 MG tablet Take 1 tablet (5 mg total) by mouth daily as needed. 05/27/19   [name], [name], MD  ondansetron (ZOFRAN) 4 MG tablet Take 1 tablet (4 mg total) by mouth every 8 (eight) hours as needed for nausea or vomiting. 08/13/19   Nafziger, Tommi Rumps, NP  warfarin (COUMADIN) 2.5 MG tablet Take by mouth daily. 04/10/19   [provider]    Family History Family History  Problem Relation Age of Onset   Arthritis Mother     Social  History Social History   Tobacco Use   Smoking status: Former Smoker   Smokeless tobacco: Never Used  Substance Use Topics   Alcohol use: Yes   Drug use: Never     Allergies   Patient has no known allergies.   Review of Systems Review of Systems  Constitutional: Negative for appetite change and fatigue.  HENT: Negative for congestion, ear discharge and sinus pressure.   Eyes: Negative for discharge.  Respiratory: Negative for cough.   Cardiovascular: Negative for chest pain.  Gastrointestinal: Negative for abdominal pain and diarrhea.  Genitourinary: Negative for frequency and hematuria.  Musculoskeletal: Negative for back pain.    Skin: Negative for rash.  Neurological: Negative for seizures and headaches.  Psychiatric/Behavioral: Positive for confusion. Negative for hallucinations.     Physical Exam Updated Vital Signs BP 113/71    Pulse 60    Temp 98.4 F (36.9 C) (Oral)    Resp 16    Ht 5\' 9"  (1.753 m)    Wt 81.6 kg    SpO2 98%    BMI 26.58 kg/m   Physical Exam Vitals signs and nursing note reviewed.  Constitutional:      Appearance: He is well-developed.  HENT:     Head: Normocephalic.     Nose: Nose normal.  Eyes:     General: No scleral icterus.    Conjunctiva/sclera: Conjunctivae normal.  Neck:     Musculoskeletal: Neck supple.     Thyroid: No thyromegaly.  Cardiovascular:     Rate and Rhythm: Normal rate and regular rhythm.     Heart sounds: No murmur. No friction rub. No gallop.   Pulmonary:     Breath sounds: No stridor. No wheezing or rales.  Chest:     Chest wall: No tenderness.  Abdominal:     General: There is no distension.     Tenderness: There is no abdominal tenderness. There is no rebound.  Musculoskeletal: Normal range of motion.  Lymphadenopathy:     Cervical: No cervical adenopathy.  Skin:    Findings: No erythema or rash.  Neurological:     Mental Status: He is oriented to person, place, and time.     Motor: No abnormal muscle tone.     Coordination: Coordination normal.     Comments: Patient having difficulty in getting the correct words out he does not have slurred speech is just trying to think of the right words  Psychiatric:        Behavior: Behavior normal.      ED Treatments / Results  Labs (all labs ordered are listed, but only abnormal results are displayed) Labs Reviewed  PROTIME-INR - Abnormal; Notable for the following components:      Result Value   Prothrombin Time 24.2 (*)    INR 2.2 (*)    All other components within normal limits  CBC - Abnormal; Notable for the following components:   RDW 16.2 (*)    All other components within normal  limits  DIFFERENTIAL - Abnormal; Notable for the following components:   Lymphs Abs 0.5 (*)    All other components within normal limits  COMPREHENSIVE METABOLIC PANEL - Abnormal; Notable for the following components:   Sodium 133 (*)    Potassium 2.9 (*)    Chloride 93 (*)    Glucose, Bld 125 (*)    BUN 30 (*)    Creatinine, Ser 1.80 (*)    Calcium 8.8 (*)    Total Protein 5.4 (*)  Albumin 2.6 (*)    AST 43 (*)    Alkaline Phosphatase 446 (*)    GFR calc non Af Amer 36 (*)    GFR calc Af Amer 42 (*)    All other components within normal limits  I-STAT CHEM 8, ED - Abnormal; Notable for the following components:   Sodium 131 (*)    Potassium 2.9 (*)    Chloride 92 (*)    BUN 31 (*)    Creatinine, Ser 1.70 (*)    Glucose, Bld 121 (*)    Calcium, Ion 1.04 (*)    All other components within normal limits  CBG MONITORING, ED - Abnormal; Notable for the following components:   Glucose-Capillary 130 (*)    All other components within normal limits  APTT    EKG EKG Interpretation  Date/Time:  Sunday September 07 2019 16:32:30 EST Ventricular Rate:  60 PR Interval:    QRS Duration: 164 QT Interval:  498 QTC Calculation: 498 R Axis:   -73 Text Interpretation: Ventricular-paced rhythm Abnormal ECG Confirmed by Milton Ferguson [phone]) on 09/07/2019 8:19:37 PM Also confirmed by Milton Ferguson [phone])  on 09/07/2019 8:29:39 PM   Radiology Ct Head Wo Contrast  Result Date: 09/07/2019 CLINICAL DATA:  Loss of feeling. EXAM: CT HEAD WITHOUT CONTRAST TECHNIQUE: Contiguous axial images were obtained from the base of the skull through the vertex without intravenous contrast. COMPARISON:  None. FINDINGS: Brain: No evidence of acute infarction, hemorrhage, hydrocephalus, extra-axial collection or mass lesion/mass effect. Age related atrophy is noted. Vascular: No hyperdense vessel or unexpected calcification. Skull: Normal. Negative for fracture or focal lesion. Sinuses/Orbits: No acute  finding. Other: None. IMPRESSION: No acute intracranial abnormality. Electronically Signed   By: Constance Holster M.D.   On: 09/07/2019 17:03    Procedures Procedures (including critical care time)  Medications Ordered in ED Medications  sodium chloride flush (NS) 0.9 % injection 3 mL (3 mLs Intravenous Not Given 09/07/19 2037)  LORazepam (ATIVAN) injection 0.5 mg (has no administration in time range)  potassium chloride SA (KLOR-CON) CR tablet 40 mEq (has no administration in time range)     Initial Impression / Assessment and Plan / ED Course  I have reviewed the triage vital signs and the nursing notes.  Pertinent labs & imaging results that were available during my care of the patient were reviewed by me and considered in my medical decision making (see chart for details). CRITICAL CARE Performed by: Milton Ferguson Total critical care time40 minutes Critical care time was exclusive of separately billable procedures and treating other patients. Critical care was necessary to treat or prevent imminent or life-threatening deterioration. Critical care was time spent personally by me on the following activities: development of treatment plan with patient and/or surrogate as well as nursing, discussions with consultants, evaluation of patient's response to treatment, examination of patient, obtaining history from patient or surrogate, ordering and performing treatments and interventions, ordering and review of laboratory studies, ordering and review of radiographic studies, pulse oximetry and re-evaluation of patient's condition.        Patient will be admitted for possible stroke.  He cannot get an MRI because of his pacemaker.  Medicine will admit and neurology is consulting  Final Clinical Impressions(s) / ED Diagnoses   Final diagnoses:  None    ED Discharge Orders    None       Milton Ferguson, MD 09/07/19 2100

## 2019-09-08 ENCOUNTER — Encounter (HOSPITAL_COMMUNITY): Payer: Self-pay | Admitting: Internal Medicine

## 2019-09-08 ENCOUNTER — Observation Stay (HOSPITAL_COMMUNITY): Payer: Medicare Other

## 2019-09-08 ENCOUNTER — Other Ambulatory Visit: Payer: Self-pay

## 2019-09-08 ENCOUNTER — Ambulatory Visit (HOSPITAL_BASED_OUTPATIENT_CLINIC_OR_DEPARTMENT_OTHER): Payer: Medicare Other

## 2019-09-08 ENCOUNTER — Observation Stay (HOSPITAL_BASED_OUTPATIENT_CLINIC_OR_DEPARTMENT_OTHER): Payer: Medicare Other

## 2019-09-08 DIAGNOSIS — I340 Nonrheumatic mitral (valve) insufficiency: Secondary | ICD-10-CM

## 2019-09-08 DIAGNOSIS — I428 Other cardiomyopathies: Secondary | ICD-10-CM | POA: Diagnosis not present

## 2019-09-08 DIAGNOSIS — R4182 Altered mental status, unspecified: Secondary | ICD-10-CM

## 2019-09-08 DIAGNOSIS — G9340 Encephalopathy, unspecified: Secondary | ICD-10-CM | POA: Diagnosis not present

## 2019-09-08 DIAGNOSIS — N184 Chronic kidney disease, stage 4 (severe): Secondary | ICD-10-CM | POA: Diagnosis not present

## 2019-09-08 DIAGNOSIS — I361 Nonrheumatic tricuspid (valve) insufficiency: Secondary | ICD-10-CM

## 2019-09-08 LAB — URINALYSIS, ROUTINE W REFLEX MICROSCOPIC
Bilirubin Urine: NEGATIVE
Glucose, UA: NEGATIVE mg/dL
Hgb urine dipstick: NEGATIVE
Ketones, ur: NEGATIVE mg/dL
Leukocytes,Ua: NEGATIVE
Nitrite: NEGATIVE
Protein, ur: 30 mg/dL — AB
Specific Gravity, Urine: 1.021 (ref 1.005–1.030)
pH: 5 (ref 5.0–8.0)

## 2019-09-08 LAB — ECHOCARDIOGRAM COMPLETE
Height: 69 in
Weight: 2737.6 oz

## 2019-09-08 LAB — VITAMIN B12: Vitamin B-12: 683 pg/mL (ref 180–914)

## 2019-09-08 LAB — CBC WITH DIFFERENTIAL/PLATELET
Abs Immature Granulocytes: 0.02 10*3/uL (ref 0.00–0.07)
Basophils Absolute: 0.1 10*3/uL (ref 0.0–0.1)
Basophils Relative: 1 %
Eosinophils Absolute: 0.2 10*3/uL (ref 0.0–0.5)
Eosinophils Relative: 4 %
HCT: 38 % — ABNORMAL LOW (ref 39.0–52.0)
Hemoglobin: 12.9 g/dL — ABNORMAL LOW (ref 13.0–17.0)
Immature Granulocytes: 0 %
Lymphocytes Relative: 10 %
Lymphs Abs: 0.5 10*3/uL — ABNORMAL LOW (ref 0.7–4.0)
MCH: 31.4 pg (ref 26.0–34.0)
MCHC: 33.9 g/dL (ref 30.0–36.0)
MCV: 92.5 fL (ref 80.0–100.0)
Monocytes Absolute: 0.5 10*3/uL (ref 0.1–1.0)
Monocytes Relative: 10 %
Neutro Abs: 3.7 10*3/uL (ref 1.7–7.7)
Neutrophils Relative %: 75 %
Platelets: 253 10*3/uL (ref 150–400)
RBC: 4.11 MIL/uL — ABNORMAL LOW (ref 4.22–5.81)
RDW: 16.2 % — ABNORMAL HIGH (ref 11.5–15.5)
WBC: 5 10*3/uL (ref 4.0–10.5)
nRBC: 0 % (ref 0.0–0.2)

## 2019-09-08 LAB — HEMOGLOBIN A1C
Hgb A1c MFr Bld: 5.9 % — ABNORMAL HIGH (ref 4.8–5.6)
Mean Plasma Glucose: 122.63 mg/dL

## 2019-09-08 LAB — COMPREHENSIVE METABOLIC PANEL
ALT: 23 U/L (ref 0–44)
AST: 47 U/L — ABNORMAL HIGH (ref 15–41)
Albumin: 2.3 g/dL — ABNORMAL LOW (ref 3.5–5.0)
Alkaline Phosphatase: 369 U/L — ABNORMAL HIGH (ref 38–126)
Anion gap: 12 (ref 5–15)
BUN: 29 mg/dL — ABNORMAL HIGH (ref 8–23)
CO2: 27 mmol/L (ref 22–32)
Calcium: 8.7 mg/dL — ABNORMAL LOW (ref 8.9–10.3)
Chloride: 94 mmol/L — ABNORMAL LOW (ref 98–111)
Creatinine, Ser: 1.74 mg/dL — ABNORMAL HIGH (ref 0.61–1.24)
GFR calc Af Amer: 44 mL/min — ABNORMAL LOW (ref >60)
GFR calc non Af Amer: 38 mL/min — ABNORMAL LOW (ref >60)
Glucose, Bld: 115 mg/dL — ABNORMAL HIGH (ref 70–99)
Potassium: 3.5 mmol/L (ref 3.5–5.1)
Sodium: 133 mmol/L — ABNORMAL LOW (ref 135–145)
Total Bilirubin: 1.7 mg/dL — ABNORMAL HIGH (ref 0.3–1.2)
Total Protein: 4.8 g/dL — ABNORMAL LOW (ref 6.5–8.1)

## 2019-09-08 LAB — LIPID PANEL
Cholesterol: 182 mg/dL (ref 0–200)
HDL: 60 mg/dL (ref >40)
LDL Cholesterol: 98 mg/dL (ref 0–99)
Total CHOL/HDL Ratio: 3 RATIO
Triglycerides: 120 mg/dL (ref <150)
VLDL: 24 mg/dL (ref 0–40)

## 2019-09-08 LAB — SARS CORONAVIRUS 2 (TAT 6-24 HRS): SARS Coronavirus 2: NEGATIVE

## 2019-09-08 LAB — POC SARS CORONAVIRUS 2 AG -  ED: SARS Coronavirus 2 Ag: NEGATIVE

## 2019-09-08 LAB — PROTIME-INR
INR: 2 — ABNORMAL HIGH (ref 0.8–1.2)
Prothrombin Time: 22.4 seconds — ABNORMAL HIGH (ref 11.4–15.2)

## 2019-09-08 LAB — RPR: RPR Ser Ql: NONREACTIVE

## 2019-09-08 LAB — MAGNESIUM: Magnesium: 2.1 mg/dL (ref 1.7–2.4)

## 2019-09-08 MED ORDER — ALBUTEROL SULFATE (2.5 MG/3ML) 0.083% IN NEBU: 2.5000 mg | INHALATION_SOLUTION | Freq: Four times a day (QID) | RESPIRATORY_TRACT | Status: DC | PRN

## 2019-09-08 MED ORDER — POTASSIUM CHLORIDE CRYS ER 20 MEQ PO TBCR
40.0000 meq | EXTENDED_RELEASE_TABLET | Freq: Once | ORAL | Status: AC
  Administered 2019-09-08: 09:00:00 40 meq via ORAL
  Filled 2019-09-08: qty 2

## 2019-09-08 MED ORDER — ACETAMINOPHEN 325 MG PO TABS
650.0000 mg | ORAL_TABLET | ORAL | Status: DC | PRN
  Administered 2019-09-08 – 2019-09-09 (×2): 650 mg via ORAL
  Filled 2019-09-08 (×3): qty 2

## 2019-09-08 MED ORDER — ALLOPURINOL 100 MG PO TABS
100.0000 mg | ORAL_TABLET | Freq: Two times a day (BID) | ORAL | Status: DC
  Administered 2019-09-08 – 2019-09-09 (×3): 100 mg via ORAL
  Filled 2019-09-08 (×5): qty 1

## 2019-09-08 MED ORDER — ACETAMINOPHEN 325 MG PO TABS
650.0000 mg | ORAL_TABLET | ORAL | Status: AC
  Administered 2019-09-08: 01:00:00 650 mg via ORAL

## 2019-09-08 MED ORDER — ACETAMINOPHEN 160 MG/5ML PO SOLN: 650.0000 mg | ORAL | Status: DC | PRN

## 2019-09-08 MED ORDER — ACETAMINOPHEN 650 MG RE SUPP: 650.0000 mg | RECTAL | Status: DC | PRN

## 2019-09-08 MED ORDER — INFLUENZA VAC A&B SA ADJ QUAD 0.5 ML IM PRSY
0.5000 mL | PREFILLED_SYRINGE | INTRAMUSCULAR | Status: AC
  Administered 2019-09-09: 10:00:00 0.5 mL via INTRAMUSCULAR
  Filled 2019-09-08: qty 0.5

## 2019-09-08 MED ORDER — WARFARIN SODIUM 1 MG PO TABS
1.5000 mg | ORAL_TABLET | Freq: Once | ORAL | Status: AC
  Administered 2019-09-08: 1.5 mg via ORAL
  Filled 2019-09-08: qty 1

## 2019-09-08 MED ORDER — STROKE: EARLY STAGES OF RECOVERY BOOK: Freq: Once | Status: DC

## 2019-09-08 MED ORDER — FINASTERIDE 5 MG PO TABS
5.0000 mg | ORAL_TABLET | Freq: Every morning | ORAL | Status: DC
  Administered 2019-09-08 – 2019-09-09 (×2): 5 mg via ORAL
  Filled 2019-09-08 (×3): qty 1

## 2019-09-08 NOTE — Care Management Obs Status (Signed)
Mobile NOTIFICATION   Patient Details  Name: Charles Mcmahon MRN: [id number] Date of Birth: 1946-04-07   Medicare Observation Status Notification Given:  Yes    Marilu Favre, RN 09/08/2019, 4:21 PM

## 2019-09-08 NOTE — Evaluation (Signed)
Speech Language Pathology Evaluation Patient Details Name: Charles Mcmahon MRN: [id number] DOB: 11-14-1945 Today's Date: 09/08/2019 Time: 0930-1000 SLP Time Calculation (min) (ACUTE ONLY): 30 min  Problem List:  Patient Active Problem List   Diagnosis Date Noted  . Acute encephalopathy 09/07/2019  . Chronic heart failure ([place]) 08/07/2019  . 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) 08/07/2019  . 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 Nonischemic cardiomyopathy ([place]) 08/07/2019  . CKD (chronic kidney disease) stage 4, GFR 15-29 ml/min ([facility]) 08/07/2019  . Hypertension with heart disease 06/17/2019  . Elevated alkaline phosphatase level 06/17/2019  . Kidney disease 06/17/2019  . Chronic anticoagulation 06/17/2019  . Long term (current) use of anticoagulants 06/17/2019  . Permanent atrial fibrillation ([place]) 06/17/2019   Past Medical History:  Past Medical History:  Diagnosis Date  . Allergy   . Arthritis   . Asthma   . Cancer ([place])   . Cardiac arrhythmia due to congenital heart disease   . Coronary artery disease   . Heart disease   . Heart disease   . Heart murmur   . Hx: UTI (urinary tract infection)    Past Surgical History:  Past Surgical History:  Procedure Laterality Date  . CHOLECYSTECTOMY  2008  . HERNIA REPAIR  2019   right side anguler  . MELANOMA EXCISION Right 1993   right arm  . PACEMAKER IMPLANT    . REPLACEMENT TOTAL KNEE Right    HPI:  73yo male admitted 11/06/2018 with increasing confusion. PMH: nonischemic cardiomyopathy, complete heart block s/p pacemaker, CKD4, cancer, CAD, AFib. HeadCT = unremarkable. Unable to have MRI   Assessment / Plan / Recommendation Clinical Impression  [facility] Mental Status (SLUMS) Examination was administered. Pt scored 17/30, indicating neurocognitive deficits. Points were lost on orientation, mental math, thought organization, reverse  digit repetition, auditory attention/retention, delayed recall and clock drawing task. Continued skilled ST intervention is not recommended acutely, however, follow up with home health speech therapy is recommended upon discharge. 24 hour supervision is also recommended, given level of responsibility at home (caregiver for wife) and deficits noted today.    SLP Assessment  SLP Recommendation/Assessment: All further Speech Language Pathology  needs can be addressed in the next venue of care SLP Visit Diagnosis: Cognitive communication deficit (R41.841)    Follow Up Recommendations  24 hour supervision/assistance;Home health SLP       SLP Evaluation Cognition  Overall Cognitive Status: No family/caregiver present to determine baseline cognitive functioning Arousal/Alertness: Awake/alert Orientation Level: Oriented to person;Oriented to place;Oriented to situation Attention: Focused;Sustained Focused Attention: Appears intact Sustained Attention: Appears intact Memory: Impaired Memory Impairment: Decreased short term memory;Retrieval deficit;Decreased recall of new information Decreased Short Term Memory: Verbal basic Awareness: Impaired Awareness Impairment: Intellectual impairment Problem Solving: Impaired Problem Solving Impairment: Verbal basic Executive Function: Organizing Organizing: Impaired Organizing Impairment: Verbal basic       Comprehension  Auditory Comprehension Overall Auditory Comprehension: Appears within functional limits for tasks assessed    Expression Expression Primary Mode of Expression: Verbal Verbal Expression Overall Verbal Expression: Appears within functional limits for tasks assessed   Oral / Motor  Oral Motor/Sensory Function Overall Oral Motor/Sensory Function: Within functional limits Motor Speech Overall Motor Speech: Appears within functional limits for tasks assessed   [place], JAARS, [place]  Pathologist Office: [phone]  Pager: [phone]  Shonna Chock 09/08/2019, 10:03 AM

## 2019-09-08 NOTE — TOC Initial Note (Addendum)
Transition of Care [facility]) - Initial/Assessment Note    Patient Details  Name: Charles Mcmahon MRN: [id number] Date of Birth: [birth year]  Transition of Care [facility]) CM/SW Contact:    Marilu Favre, RN Phone Number: 09/08/2019, 4:23 PM  Clinical Narrative:                  Confirmed face sheet information with patient and daughter Lattie Haw at bedside . Patient has walker at home, needs 3 in 1 . Provided Medicare.gov list . Daughter requesting some more time to decide on home health agency. Will follow up .   Daughter would like [facility] has accepted referral for PT,OT,SP Expected Discharge Plan: [place] Barriers to Discharge: Continued Medical Work up   Patient Goals and CMS Choice Patient states their goals for this hospitalization and ongoing recovery are:: to return home CMS Medicare.gov Compare Post Acute Care list provided to:: Patient Choice offered to / list presented to : Patient, Adult Children  Expected Discharge Plan and Services Expected Discharge Plan: [place]   Discharge Planning Services: CM Consult Post Acute Care Choice: [place] arrangements for the past 2 months: Single Family Home                 DME Arranged: 3-N-1 DME Agency: [employer] Date DME Agency Contacted: 09/08/19 Time DME Agency Contacted: [phone] Representative spoke with at DME Agency: [place]: PT          Prior Living Arrangements/Services Living arrangements for the past 2 months: [place] with:: Spouse Patient language and need for interpreter reviewed:: Yes        Need for Family Participation in Patient Care: Yes (Comment) Care giver support system in place?: Yes (comment) Current home services: DME Criminal Activity/Legal Involvement Pertinent to Current Situation/Hospitalization: No - Comment as needed  Activities of Daily Living Home Assistive Devices/Equipment: Cane (specify quad or  straight) ADL Screening (condition at time of admission) Patient's cognitive ability adequate to safely complete daily activities?: Yes Is the patient deaf or have difficulty hearing?: Yes Does the patient have difficulty seeing, even when wearing glasses/contacts?: No Does the patient have difficulty concentrating, remembering, or making decisions?: No Patient able to express need for assistance with ADLs?: No Does the patient have difficulty dressing or bathing?: No Independently performs ADLs?: Yes (appropriate for developmental age) Does the patient have difficulty walking or climbing stairs?: Yes Weakness of Legs: Both Weakness of Arms/Hands: Right  Permission Sought/Granted   Permission granted to share information with : Yes, Verbal Permission Granted  Share Information with NAME: Lattie Haw     Permission granted to share info w Relationship: daughter     Emotional Assessment Appearance:: Appears stated age Attitude/Demeanor/Rapport: Engaged Affect (typically observed): Accepting Orientation: : Oriented to Self, Oriented to Place, Oriented to  Time, Oriented to Situation Alcohol / Substance Use: Not Applicable Psych Involvement: No (comment)  Admission diagnosis:  Confusion [R41.0] Acute encephalopathy [G93.40] Patient Active Problem List   Diagnosis Date Noted  . Acute encephalopathy 09/07/2019  . Chronic heart failure ([place]) 08/07/2019  . 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 Heart block AV complete ([place]) 08/07/2019  . 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 Nonischemic cardiomyopathy ([place]) 08/07/2019  . CKD (chronic kidney disease) stage 4, GFR 15-29 ml/min (HCC) 08/07/2019  . Hypertension with heart disease 06/17/2019  . Elevated alkaline phosphatase level 06/17/2019  . Kidney disease 06/17/2019  .  Chronic anticoagulation 06/17/2019  . Long term (current) use of anticoagulants 06/17/2019  . Permanent atrial fibrillation ([place])  06/17/2019   PCP:  [name], [name], MD Pharmacy:   [facility] [address] [place] [address] Phone: [phone] Fax: [phone]  [id number] - [place], [place] - [address] [place] [address] Phone: [phone] Fax: [phone]     Social Determinants of Health (SDOH) Interventions    Readmission Risk Interventions No flowsheet data found.

## 2019-09-08 NOTE — Progress Notes (Signed)
STROKE TEAM PROGRESS NOTE   INTERVAL HISTORY His nurse and speech therapist are at the bedside.  He is lying in the bed. States he has resolved. He is the primary caregiver for his wife who had a stroke 10 yrs ago. He has a pacer.  I have personally reviewed history of presenting illness with the patient in detail, electronic medical records and imaging films in PACS.  CT scan of the head is negative for acute stroke and echocardiogram was unremarkable.  LDL cholesterol 98 mg percent.  Hemoglobin A1c is 5.8.  Repeat CT scan is pending for this afternoon and EEG has been ordered.  Vitals:   09/08/19 0630 09/08/19 0700 09/08/19 0800 09/08/19 0840  BP:  101/62 98/80 96/63   Pulse:    (!) 58  Resp: 17 15 20 18   Temp:    (!) 97.5 F (36.4 C)  TempSrc:    Oral  SpO2:    100%  Weight:    77.6 kg  Height:    5\' 9"  (1.753 m)    CBC:  Recent Labs  Lab 09/07/19 1624 09/07/19 1655 09/08/19 0642  WBC 5.6  --  5.0  NEUTROABS 4.3  --  3.7  HGB 13.6 14.6 12.9*  HCT 41.4 43.0 38.0*  MCV 94.3  --  92.5  PLT 286  --  852    Basic Metabolic Panel:  Recent Labs  Lab 09/07/19 1624 09/07/19 1655 09/08/19 0642  NA 133* 131* 133*  K 2.9* 2.9* 3.5  CL 93* 92* 94*  CO2 27  --  27  GLUCOSE 125* 121* 115*  BUN 30* 31* 29*  CREATININE 1.80* 1.70* 1.74*  CALCIUM 8.8*  --  8.7*  MG  --   --  2.1   Lipid Panel:     Component Value Date/Time   CHOL 182 09/08/2019 0642   TRIG 120 09/08/2019 0642   HDL 60 09/08/2019 0642   CHOLHDL 3.0 09/08/2019 0642   VLDL 24 09/08/2019 0642   LDLCALC 98 09/08/2019 0642   HgbA1c:  Lab Results  Component Value Date   HGBA1C 5.9 (H) 09/08/2019   Urine Drug Screen: No results found for: LABOPIA, COCAINSCRNUR, LABBENZ, AMPHETMU, THCU, LABBARB  Alcohol Level No results found for: [facility]  IMAGING Ct Head Wo Contrast  Result Date: 09/07/2019 CLINICAL DATA:  Loss of feeling. EXAM: CT HEAD WITHOUT CONTRAST TECHNIQUE: Contiguous axial images were obtained from  the base of the skull through the vertex without intravenous contrast. COMPARISON:  None. FINDINGS: Brain: No evidence of acute infarction, hemorrhage, hydrocephalus, extra-axial collection or mass lesion/mass effect. Age related atrophy is noted. Vascular: No hyperdense vessel or unexpected calcification. Skull: Normal. Negative for fracture or focal lesion. Sinuses/Orbits: No acute finding. Other: None. IMPRESSION: No acute intracranial abnormality. Electronically Signed   By: Constance Holster M.D.   On: 09/07/2019 17:03    PHYSICAL EXAM Pleasant elderly Caucasian male not in distress. . Afebrile. Head is nontraumatic. Neck is supple without bruit.    Cardiac exam no murmur or gallop. Lungs are clear to auscultation. Distal pulses are well felt. Neurological Exam :  Awake alert oriented to place and person.  Speech is clear without dysarthria aphasia or apraxia.  Diminished attention, registration and recall.  Poor 3 word recall 0/3.  Clock drawing 3/4.  Able to name only 12 animals which can walk on 4 legs.  Follows simple midline and one-step and occasional two-step commands.  Extraocular movements are full range without nystagmus.  Face  is symmetric without weakness.  Blinks to threat bilaterally.  Motor system exam reveals symmetric upper and lower extremity strength without focal weakness.  Deep tendon flexes symmetric.  Plantars are downgoing.  ASSESSMENT/PLAN Mr. APOLLO TIMOTHY is a 73 y.o. male with history of chronic atrial fibrillation on Coumadin, possible sleep apnea, restless leg, history of UTIs in the past, CKD, coronary artery disease, pacemaker placement for heart failure, presenting with changes in mentation and generalized weakness.   Possible Stroke vs TIA.  Also suspect underlying mild cognitive impairment  CT head No acute abnormality.   Repeat CT head at 4p to confirm/refute infarct   CTA head / neck not done d/t CKD  MRI  / MRA  Not done d/t pacer  Carotid Doppler   pending   2D Echo pending   EEG pending   LDL 98  HgbA1c 5.9  Urinalysis rare bacteria, 30 protein.    Warfarin for VTE prophylaxis  warfarin daily prior to admission, now on warfarin daily. Continue at d/c. Not a DOAC candidate given CKD.  Therapy recommendations:  pending   Disposition:  pending   Atrial Fibrillation  Home anticoagulation:  warfarin daily continued in the hospital  Last INR 2.0 . Continue warfarin daily at discharge. Not a DOAC candidate given CKD.   Blood Pressure  Stable, on the low side . BP goal normotensive  Hyperlipidemia  Home meds:  No statin  LDL 98, goal < 70  Add statin if imaging positive for stroke  Continue statin at discharge  Other Stroke Risk Factors  Advanced age  Former Cigarette smoker  ETOH use, advised to drink no more than 2 drink(s) a day  Coronary artery disease  Nonischemic cardiomyopathy on lasix    CHB s/p pacer  Other Active Problems  CKD stage IV   Hypokalemia    Severe protein calorie malnutrition  Elevated alkaline phosphatase  Hospital day # 0  I have personally obtained history,examined this patient, reviewed notes, independently viewed imaging studies, participated in medical decision making and plan of care.ROS completed by me personally and pertinent positives fully documented  I have made any additions or clarifications directly to the above note.  He presented with transient episodes of confusion and inability to figure out and do things and he feels is back to normal but bedside neurological exam does show some some mild cognitive impairment not sure if this is baseline or part of some resolving encephalopathy.  Recommend check EEG for seizure activity and repeat CT scan this afternoon as we cannot do an MRI due to his pacemaker.  Check UA for infection.  Discussed with Dr. Lucianne Lei.  Greater than 50% time during this 35-minute visit was spent on counseling and coordination of care about his  altered mental status and discussion about TIA versus seizure and answering questions  Antony Contras, MD Medical Director [place] Pager: [phone] 09/08/2019 2:24 PM   To contact Stroke Continuity provider, please refer to [email or web address] After hours, contact General Neurology

## 2019-09-08 NOTE — Progress Notes (Addendum)
Patient admitted to obs after midnight. Care began before midnight. Admitted for stroke/tia work up and cannot have mri due to pacer. Repeating CT this afternoon per neuro recommendations. In meantime workup in process.   Patient reports symptoms resolved. Complains of discomfort on ED stretcher.   A/p 1. Acute encephalopathy family suspecting for stroke. Resolved this am. Unable to have MRI. Repeating CT this afternoon. Follow echo. HgA1c 5.9. Lipid panel within limits of normal.  Per neurology recommendation patient will be on Coumadin. Await  Physical therapy recommendations. 2. Hypokalemia could be from use of Lasix. Potassium trending up after repleted. Patient states he has not been using Lasix for last almost a week since he ran out. Prior to that he decreased dose. Patient blood pressures remain soft. Mild pitting edema bilateral LE edema, right hand puffy. Will resume lasix once BP allows 3. Nonischemic cardiomyopathy EF not known.  Follow 2D echo.  Being followed by [facility] cardiology.  Presently patient has not been taking his Lasix for last few days.  Appears compensated.  Patient blood pressure in the low normal appears to be the same per cardiology notes. 4. Chronic kidney disease stage IV creatinine remains stable at baseline being followed by nephrologist. 5. History of atrial fibrillation and history of complete heart block status post pacemaker placement presently in CRT on Coumadin. 6. Severe protein calorie malnutrition will need further work-up and nutrition input. 7. Elevated alkaline phosphatase level will need further work-up as outpatient. Reports he was told this "years ago but it has not changed"  Karen black, np  Patient may be able to d/c later today.  Await EEG  CT scan stable.  SLP- home health; await PT/OT JV

## 2019-09-08 NOTE — Progress Notes (Signed)
  Echocardiogram 2D Echocardiogram has been performed.  Charles Mcmahon 09/08/2019, 11:54 AM

## 2019-09-08 NOTE — ED Notes (Signed)
Breakfast Ordered 

## 2019-09-08 NOTE — Progress Notes (Signed)
[place] for Coumadin Indication: atrial fibrillation  Allergies  Allergen Reactions  . Gadolinium Derivatives Nausea And Vomiting    Reported by [facility] 04/23/2009    Patient Measurements: Height: 5\' 9"  (175.3 cm) Weight: 180 lb (81.6 kg) IBW/kg (Calculated) : 70.7  Vital Signs: BP: 101/62 (11/23 0700) Pulse Rate: 71 (11/23 0600)  Labs: Recent Labs    09/07/19 1624 09/07/19 1655 09/08/19 0510 09/08/19 0642  HGB 13.6 14.6  --  12.9*  HCT 41.4 43.0  --  38.0*  PLT 286  --   --  253  APTT 36  --   --   --   LABPROT 24.2*  --  22.4*  --   INR 2.2*  --  2.0*  --   CREATININE 1.80* 1.70*  --   --     Estimated Creatinine Clearance: 38.1 mL/min (A) (by C-G formula based on SCr of 1.7 mg/dL (H)).   Medical History: Past Medical History:  Diagnosis Date  . Allergy   . Arthritis   . Asthma   . Cancer ([place])   . Cardiac arrhythmia due to congenital heart disease   . Coronary artery disease   . Heart disease   . Heart disease   . Heart murmur   . Hx: UTI (urinary tract infection)     Medications:  No current facility-administered medications on file prior to encounter.    Current Outpatient Medications on File Prior to Encounter  Medication Sig Dispense Refill  . acetaminophen (TYLENOL) 500 MG tablet Take 1,000 mg by mouth every 8 (eight) hours.    Marland Kitchen albuterol (VENTOLIN HFA) 108 (90 Base) MCG/ACT inhaler Inhale 2 puffs into the lungs every 6 (six) hours as needed for wheezing or shortness of breath.     . allopurinol (ZYLOPRIM) 100 MG tablet Take 100 mg by mouth 2 (two) times daily.    . finasteride (PROSCAR) 5 MG tablet Take 5 mg by mouth every morning.     . furosemide (LASIX) 80 MG tablet Take 80 mg by mouth 2 (two) times daily.    . metolazone (ZAROXOLYN) 5 MG tablet Take 1 tablet (5 mg total) by mouth daily as needed. (Patient taking differently: Take 5 mg by mouth daily as needed (weight gain 5 lb overnight). )  30 tablet 2  . OVER THE COUNTER MEDICATION Apply 1 application topically 3 (three) times daily as needed (pain). CBD Cream    . OVER THE COUNTER MEDICATION Place 1 drop into both eyes 2 (two) times daily as needed (itching/irritation). Similasin allergy eye relief    . warfarin (COUMADIN) 2.5 MG tablet Take 1.25-2.5 mg by mouth See admin instructions. Take one tablet (2.5 mg) by mouth on Sunday, Tuesday, Thursday at 7pm, take 1/2 tablet (1.25 mg) on Monday, Wednesday, Friday, Saturday at 7pm    . furosemide (LASIX) 80 MG tablet Take 2 tablets (160 mg total) by mouth every morning AND 1 tablet (80 mg total) every evening. (Patient not taking: Reported on 09/07/2019) 60 tablet 3  . ondansetron (ZOFRAN) 4 MG tablet Take 1 tablet (4 mg total) by mouth every 8 (eight) hours as needed for nausea or vomiting. (Patient not taking: Reported on 09/07/2019) 20 tablet 0    Assessment: 73 y.o. male admitted with AMS, possible CVA, H/O Afib, to continue Coumadin.  INR 2.2 on admission yesterday - now at 2. Received 2.5 mg 11/22 after being held since 11/19 due to supratherapeutic INR (4.1) on  last anticoag appointment. Hgb 12.9, plt 253. No s/sx of bleeding noted in chart.   PTA regimen 1.25 daily except 2.5 S,T,Th  Goal of Therapy:  INR 2-3 Monitor platelets by anticoagulation protocol: Yes   Plan:  Coumadin 1.5 mg tonight Daily INR F/u CT head - might need alternative anticoagulation   Antonietta Jewel, PharmD, BCCCP Clinical Pharmacist  Phone: [phone]  Please check AMION for all [place] phone numbers After 10:00 PM, call [place] [phone] 09/08/2019,7:35 AM

## 2019-09-08 NOTE — Procedures (Signed)
Echo attempted. Patient with MD and PT also in room. Will attempt again.

## 2019-09-08 NOTE — Progress Notes (Signed)
EEG complete - results pending 

## 2019-09-08 NOTE — Progress Notes (Signed)
Carotid study  has been completed. Refer to [facility] under chart review to view preliminary results.   09/08/2019  3:45 PM Tramel Westbrook, Bonnye Fava

## 2019-09-08 NOTE — ED Notes (Signed)
Tried calling daughter at [phone].  Lattie Haw did not answer.  Purpose was to let her know her father was being admitted to room 5C15.

## 2019-09-08 NOTE — ED Notes (Signed)
Tele

## 2019-09-08 NOTE — H&P (Signed)
History and Physical    ALDOUS HOUSEL GQQ:[id number] DOB: 1946/05/23 DOA: 09/07/2019  PCP: [name], [name], MD  Patient coming from: Home.  Chief Complaint: Increasing confusion.  HPI: Charles Mcmahon is a 73 y.o. male with history of nonischemic cardiomyopathy with history of complete heart block status post pacemaker placement, chronic kidney disease stage IV, atrial fibrillation on Coumadin who has just recently moved from [place] to [place] 8 months ago to live near his daughter has been found to be increasingly confused and not functioning as usual by his daughter.  Patient was last seen normal about 3 days ago.  Had come to his daughter's house yesterday and was found to be confused not as sharp as he usually is.  Denies losing function of his upper extremity or lower extremity or any visual symptoms.  Given these ongoing symptoms patient was brought to the ER concerning for stroke.  ED Course: In the ER CT head was unremarkable blood pressure was in the low normal.  COVID-19 test was negative.  Since patient has a pacemaker which is not compatible with MRI neurologist at this time recommended CT head in 24 hours and if CT head does show some evolving stroke will need to change anticoagulation.  Lab work show EKG was paced rhythm hemoglobin 13.6 platelets 286 potassium 2.9 creatinine 1.8 alkaline phosphatase 446 albumin 2.6 INR 2.2.  On exam patient is moving all extremities without difficulty is able to know that he is in the hospital and is oriented to time place and person.  Review of Systems: As per HPI, rest all negative.   Past Medical History:  Diagnosis Date  . Allergy   . Arthritis   . Asthma   . Cancer ([place])   . Cardiac arrhythmia due to congenital heart disease   . Coronary artery disease   . Heart disease   . Heart disease   . Heart murmur   . Hx: UTI (urinary tract infection)     Past Surgical History:  Procedure Laterality Date  . CHOLECYSTECTOMY   2008  . HERNIA REPAIR  2019   right side anguler  . MELANOMA EXCISION Right 1993   right arm  . PACEMAKER IMPLANT    . REPLACEMENT TOTAL KNEE Right      reports that he has quit smoking. He has never used smokeless tobacco. He reports current alcohol use. He reports that he does not use drugs.  Allergies  Allergen Reactions  . Gadolinium Derivatives Nausea And Vomiting    Reported by [facility] 04/23/2009    Family History  Problem Relation Age of Onset  . Arthritis Mother     Prior to Admission medications   Medication Sig Start Date End Date Taking? Authorizing Provider  acetaminophen (TYLENOL) 500 MG tablet Take 1,000 mg by mouth every 8 (eight) hours.   Yes [provider]  albuterol (VENTOLIN HFA) 108 (90 Base) MCG/ACT inhaler Inhale 2 puffs into the lungs every 6 (six) hours as needed for wheezing or shortness of breath.  04/10/19  Yes [provider]  allopurinol (ZYLOPRIM) 100 MG tablet Take 100 mg by mouth 2 (two) times daily.   Yes [provider]  finasteride (PROSCAR) 5 MG tablet Take 5 mg by mouth every morning.    Yes [provider]  furosemide (LASIX) 80 MG tablet Take 80 mg by mouth 2 (two) times daily.   Yes [provider]  metolazone (ZAROXOLYN) 5 MG tablet Take 1 tablet (  5 mg total) by mouth daily as needed. Patient taking differently: Take 5 mg by mouth daily as needed (weight gain 5 lb overnight).  05/27/19  Yes [name], [name], MD  OVER THE COUNTER MEDICATION Apply 1 application topically 3 (three) times daily as needed (pain). CBD Cream   Yes [provider]  OVER THE COUNTER MEDICATION Place 1 drop into both eyes 2 (two) times daily as needed (itching/irritation). Similasin allergy eye relief   Yes [provider]  warfarin (COUMADIN) 2.5 MG tablet Take 1.25-2.5 mg by mouth See admin instructions. Take one tablet (2.5 mg) by mouth on Sunday, Tuesday, Thursday at 7pm, take 1/2 tablet (1.25  mg) on Monday, Wednesday, Friday, Saturday at 7pm 04/10/19  Yes [provider]  furosemide (LASIX) 80 MG tablet Take 2 tablets (160 mg total) by mouth every morning AND 1 tablet (80 mg total) every evening. Patient not taking: Reported on 09/07/2019 08/05/19   Buford Dresser, MD  ondansetron (ZOFRAN) 4 MG tablet Take 1 tablet (4 mg total) by mouth every 8 (eight) hours as needed for nausea or vomiting. Patient not taking: Reported on 09/07/2019 08/13/19   Dorothyann Peng, NP    Physical Exam: Constitutional: Moderately built and nourished. Vitals:   09/07/19 1900 09/07/19 1930 09/08/19 0000 09/08/19 0116  BP: 118/76 113/71 (!) 130/118 102/73  Pulse: 70 60 60 (!) 58  Resp: 10 16 12 15   Temp:      TempSrc:      SpO2: 100% 98% 99% 99%  Weight:      Height:       Eyes: Anicteric no pallor. ENMT: No discharge from the ears eyes nose or mouth. Neck: No mass felt.  No neck rigidity. Respiratory: No rhonchi or crepitations. Cardiovascular: S1-S2 heard. Abdomen: Soft nontender bowel sounds present. Musculoskeletal: No edema. Skin: No rash. Neurologic: Alert awake oriented time place and person.  Moves all extremities 5 x 5.  Appears mildly slow.  Pupils equal and reacting to light. Psychiatric: Appears normal normal affect.  Appears to be mildly slow.   Labs on Admission: I have personally reviewed following labs and imaging studies  CBC: Recent Labs  Lab 09/07/19 1624 09/07/19 1655  WBC 5.6  --   NEUTROABS 4.3  --   HGB 13.6 14.6  HCT 41.4 43.0  MCV 94.3  --   PLT 286  --    Basic Metabolic Panel: Recent Labs  Lab 09/07/19 1624 09/07/19 1655  NA 133* 131*  K 2.9* 2.9*  CL 93* 92*  CO2 27  --   GLUCOSE 125* 121*  BUN 30* 31*  CREATININE 1.80* 1.70*  CALCIUM 8.8*  --    GFR: Estimated Creatinine Clearance: 38.1 mL/min (A) (by C-G formula based on SCr of 1.7 mg/dL (H)). Liver Function Tests: Recent Labs  Lab 09/07/19 1624  AST 43*  ALT 27   ALKPHOS 446*  BILITOT 1.2  PROT 5.4*  ALBUMIN 2.6*   No results for input(s): LIPASE, AMYLASE in the last 168 hours. No results for input(s): AMMONIA in the last 168 hours. Coagulation Profile: Recent Labs  Lab 09/05/19 09/07/19 1624  INR 4.1* 2.2*   Cardiac Enzymes: No results for input(s): CKTOTAL, CKMB, CKMBINDEX, TROPONINI in the last 168 hours. BNP (last 3 results) Recent Labs    05/27/19 1318 06/17/19 0938  PROBNP 755.0* 661.0*   HbA1C: No results for input(s): HGBA1C in the last 72 hours. CBG: Recent Labs  Lab 09/07/19 1927  GLUCAP 130*  Lipid Profile: No results for input(s): CHOL, HDL, LDLCALC, TRIG, CHOLHDL, LDLDIRECT in the last 72 hours. Thyroid Function Tests: No results for input(s): TSH, T4TOTAL, FREET4, T3FREE, THYROIDAB in the last 72 hours. Anemia Panel: No results for input(s): VITAMINB12, FOLATE, FERRITIN, TIBC, IRON, RETICCTPCT in the last 72 hours. Urine analysis:    Component Value Date/Time   COLORURINE YELLOW 05/27/2019 1318   APPEARANCEUR CLEAR 05/27/2019 1318   LABSPEC 1.010 05/27/2019 1318   PHURINE 6.0 05/27/2019 1318   GLUCOSEU NEGATIVE 05/27/2019 1318   HGBUR NEGATIVE 05/27/2019 1318   BILIRUBINUR NEGATIVE 05/27/2019 1318   KETONESUR NEGATIVE 05/27/2019 1318   UROBILINOGEN 0.2 05/27/2019 1318   NITRITE NEGATIVE 05/27/2019 1318   LEUKOCYTESUR NEGATIVE 05/27/2019 1318   Sepsis Labs: @LABRCNTIP (procalcitonin:4,lacticidven:4) )No results found for this or any previous visit (from the past 240 hour(s)).   Radiological Exams on Admission: Ct Head Wo Contrast  Result Date: 09/07/2019 CLINICAL DATA:  Loss of feeling. EXAM: CT HEAD WITHOUT CONTRAST TECHNIQUE: Contiguous axial images were obtained from the base of the skull through the vertex without intravenous contrast. COMPARISON:  None. FINDINGS: Brain: No evidence of acute infarction, hemorrhage, hydrocephalus, extra-axial collection or mass lesion/mass effect. Age related atrophy  is noted. Vascular: No hyperdense vessel or unexpected calcification. Skull: Normal. Negative for fracture or focal lesion. Sinuses/Orbits: No acute finding. Other: None. IMPRESSION: No acute intracranial abnormality. Electronically Signed   By: Constance Holster M.D.   On: 09/07/2019 17:03    EKG: Independently reviewed.  Paced rhythm.  Assessment/Plan Active Problems:   Chronic heart failure (HCC)   Heart block AV complete (HCC)   Cardiac resynchronization therapy pacemaker (CRT-P) in place   Nonischemic cardiomyopathy (HCC)   CKD (chronic kidney disease) stage 4, GFR 15-29 ml/min (HCC)   Acute encephalopathy    1. Acute encephalopathy family suspecting for stroke.  Appreciate neurology consult.  We will keep patient on neurochecks patient passed swallow.  Per neurology recommendation patient will be on Coumadin.  Check hemoglobin A1c lipid panel repeat CT head in 24 hours that will be around 4 PM today.  Physical therapy consult. 2. Hypokalemia could be from use of Lasix.  Patient states he has not been using Lasix for last almost a week since he ran out.  Patient blood pressures are low normal appears compensated.  Replace recheck. 3. Nonischemic cardiomyopathy EF not known.  Follow 2D echo.  Being followed by [facility] cardiology.  Presently patient has not been taking his Lasix for last few days.  Appears compensated.  Patient blood pressure in the low normal appears to be the same per cardiology notes. 4. Chronic kidney disease stage IV creatinine appears to be at baseline being followed by nephrologist. 5. History of atrial fibrillation and history of complete heart block status post pacemaker placement presently in CRT on Coumadin. 6. Severe protein calorie malnutrition will need further work-up and nutrition input. 7. Elevated alkaline phosphatase level will need further work-up as outpatient.   DVT prophylaxis: Coumadin. Code Status: DNR confirmed with patient. Family Communication:  Unable to reach patient's daughter with the number provided. Disposition Plan: To be determined. Consults called: Neurology and physical therapy. Admission status: Observation.   Rise Patience MD [facility] Pager [phone].  If 7PM-7AM, please contact night-coverage [email or web address] Password [email or web address]  09/08/2019, 1:43 AM

## 2019-09-08 NOTE — Evaluation (Signed)
Occupational Therapy Evaluation Patient Details Name: Charles Mcmahon MRN: [id number] DOB: [birth year] Today's Date: 09/08/2019    History of Present Illness Pt is a 73 y/o male history of nonischemic cardiomyopathy with history of complete heart block status post pacemaker placement, CKD IV, atrial fibrillation, R TKR, presenting with increasing confusion.  CT negative, unable to perform MRI.    Clinical Impression   PTA patient independent and driving, he is his spouses caregiver. Admitted for above and limited by problem list below including impaired balance, decreased activity tolerance, generalized weakness, R UE edema and impaired cognition. Patient current requires min guard for LB ADLs, transfers, and mobility using cane with Mild unsteadiness noted.  Patient requires increased time for problem solving and processing, but overall reports he is at his baseline with cognition.  He will benefit from continued OT services while admitted and after dc at [facility] level in order to optimize independence and safety with ADLs, mobility.      Follow Up Recommendations  Home health OT;Supervision/Assistance - 24 hour(inital 24/7 support)    Equipment Recommendations  3 in 1 bedside commode    Recommendations for Other Services PT consult     Precautions / Restrictions Precautions Precautions: Fall Restrictions Weight Bearing Restrictions: No      Mobility Bed Mobility Overal bed mobility: Needs Assistance Bed Mobility: Supine to Sit     Supine to sit: Supervision     General bed mobility comments: no assist required, HOB elevated  Transfers Overall transfer level: Needs assistance Equipment used: (cane) Transfers: Sit to/from Stand Sit to Stand: Min guard         General transfer comment: min guard for safety/balance     Balance Overall balance assessment: Mild deficits observed, not formally tested                                         ADL either  performed or assessed with clinical judgement   ADL Overall ADL's : Needs assistance/impaired     Grooming: Min guard;Standing   Upper Body Bathing: Set up;Sitting   Lower Body Bathing: Min guard;Sit to/from stand   Upper Body Dressing : Set up;Sitting   Lower Body Dressing: Min guard;Sit to/from stand   Toilet Transfer: Min guard;Ambulation(cane) Toilet Transfer Details (indicate cue type and reason): simulated in room         Functional mobility during ADLs: Min guard;Cane General ADL Comments: pt limited by weakness, decreased activity tolerance and impaired balance      Vision Baseline Vision/History: Wears glasses Wears Glasses: At all times Patient Visual Report: No change from baseline Vision Assessment?: No apparent visual deficits     Perception     Praxis      Pertinent Vitals/Pain Pain Assessment: Faces Faces Pain Scale: Hurts a little bit Pain Location: R hand soreness  Pain Descriptors / Indicators: Sore Pain Intervention(s): Monitored during session     Hand Dominance     Extremity/Trunk Assessment Upper Extremity Assessment Upper Extremity Assessment: Generalized weakness;RUE deficits/detail RUE Deficits / Details: noted edema, hx of carpel tunnel release with numbness and stiffness RUE Sensation: WNL RUE Coordination: WNL   Lower Extremity Assessment Lower Extremity Assessment: Defer to PT evaluation       Communication Communication Communication: No difficulties   Cognition Arousal/Alertness: Awake/alert Behavior During Therapy: WFL for tasks assessed/performed Overall Cognitive Status: Impaired/Different from baseline Area of Impairment:  Following commands;Safety/judgement;Awareness;Problem solving;Attention                   Current Attention Level: Sustained   Following Commands: Follows one step commands consistently;Follows one step commands with increased time;Follows multi-step commands  inconsistently Safety/Judgement: Decreased awareness of safety;Decreased awareness of deficits Awareness: Emergent Problem Solving: Slow processing;Requires verbal cues General Comments: pt requires increased time to process, problem solve (attempted short blessed, but pt reports completing the same test earlier) --unclear as SLP used slums; further assessment recommended with pill box test   General Comments  RN reports positive orthostatics, but with increased time between positional changes pt asymptomatic    Exercises     Shoulder Instructions      Home Living Family/patient expects to be discharged to:: Private residence Living Arrangements: Spouse/significant other Available Help at Discharge: Family Type of Home: House Home Access: Level entry     Home Layout: One level     Bathroom Shower/Tub: Tub/shower unit;Walk-in shower   Bathroom Toilet: Standard     Home Equipment: Environmental consultant - 2 wheels;Cane - single point;Grab bars - tub/shower   Additional Comments: pt is spouses caregiver (aphasic from old CVA)       Prior Functioning/Environment Level of Independence: Independent        Comments: uses cane for mobility, independent ADLs, IADLs; driving         OT Problem List: Decreased strength;Decreased activity tolerance;Impaired balance (sitting and/or standing);Decreased cognition;Decreased safety awareness      OT Treatment/Interventions: Self-care/ADL training;Energy conservation;DME and/or AE instruction;Therapeutic activities;Balance training;Patient/family education;Cognitive remediation/compensation    OT Goals(Current goals can be found in the care plan section) Acute Rehab OT Goals Patient Stated Goal: to get moving again OT Goal Formulation: With patient Time For Goal Achievement: 09/22/19 Potential to Achieve Goals: Good  OT Frequency: Min 2X/week   Barriers to D/C:            Co-evaluation              AM-PAC OT "6 Clicks" Daily  Activity     Outcome Measure Help from another person eating meals?: None Help from another person taking care of personal grooming?: A Little Help from another person toileting, which includes using toliet, bedpan, or urinal?: A Little Help from another person bathing (including washing, rinsing, drying)?: A Little Help from another person to put on and taking off regular upper body clothing?: A Little Help from another person to put on and taking off regular lower body clothing?: A Little 6 Click Score: 19   End of Session Equipment Utilized During Treatment: Gait belt;Other (comment)(cane) Nurse Communication: Mobility status  Activity Tolerance: Patient tolerated treatment well Patient left: with call bell/phone within reach;with family/visitor present(seated EOB )  OT Visit Diagnosis: Unsteadiness on feet (R26.81);Muscle weakness (generalized) (M62.81);Other symptoms and signs involving cognitive function                Time: 6503-5465 OT Time Calculation (min): 25 min Charges:  OT General Charges $OT Visit: 1 Visit OT Evaluation $OT Eval Moderate Complexity: 1 Mod OT Treatments $Self Care/Home Management : 8-22 mins  Delight Stare, OT Acute Rehabilitation Services Pager [phone] Office [phone]   Delight Stare 09/08/2019, 2:41 PM

## 2019-09-08 NOTE — Evaluation (Signed)
Physical Therapy Evaluation Patient Details Name: Charles Mcmahon MRN: [id number] DOB: September 10, 1946 Today's Date: 09/08/2019   History of Present Illness  Pt is a 73 y/o male history of nonischemic cardiomyopathy with history of complete heart block status post pacemaker placement, CKD IV, atrial fibrillation, R TKR, presenting with increasing confusion.  CT negative, unable to perform MRI.   Clinical Impression   Patient received in bed, very pleasant and willing to participate in PT session today. Able to complete bed mobility with S, and functional transfers/gait approximately 13f with SPC and min guard for safety. Mildly unsteady but able to self-correct well and required no external assistance to maintain balance. Seems very aware of current limitations and is very motivated to work with therapy to start getting his strength and endurance back. He was left sitting at EOB with all needs met, MD present and attending, RN aware of patient status. Currently recommending skilled HHPT services moving forward.     Follow Up Recommendations Home health PT;Other (comment)([facility]    Equipment Recommendations  3in1 (PT)    Recommendations for Other Services       Precautions / Restrictions Precautions Precautions: Fall Restrictions Weight Bearing Restrictions: No      Mobility  Bed Mobility Overal bed mobility: Needs Assistance Bed Mobility: Supine to Sit     Supine to sit: Supervision     General bed mobility comments: no assist required, HOB elevated, extended time  Transfers Overall transfer level: Needs assistance Equipment used: Straight cane Transfers: Sit to/from Stand Sit to Stand: Min guard         General transfer comment: min guard for safety/balance   Ambulation/Gait Ambulation/Gait assistance: Min guard Gait Distance (Feet): 40 Feet Assistive device: Straight cane Gait Pattern/deviations: Step-through pattern;Decreased  stride length;Decreased step length - right;Decreased step length - left Gait velocity: decreased   General Gait Details: slow but steady with SPC at self-selected pace; reports he really does not have to walk more than 10-12 feet at home due to the side of his house  Stairs            Wheelchair Mobility    Modified Rankin (Stroke Patients Only)       Balance Overall balance assessment: Mild deficits observed, not formally tested                                           Pertinent Vitals/Pain Pain Assessment: No/denies pain Faces Pain Scale: No hurt Pain Location: R hand soreness  Pain Descriptors / Indicators: Sore Pain Intervention(s): Limited activity within patient's tolerance;Monitored during session    Home Living Family/patient expects to be discharged to:: Private residence Living Arrangements: Spouse/significant other Available Help at Discharge: Family Type of Home: House Home Access: Level entry     Home Layout: One level Home Equipment: WEnvironmental consultant- 2 wheels;Cane - single point;Grab bars - tub/shower Additional Comments: pt is spouses caregiver (aphasic from old CVA)     Prior Function Level of Independence: Independent         Comments: uses cane for mobility, independent ADLs, IADLs; driving      Hand Dominance        Extremity/Trunk Assessment   Upper Extremity Assessment Upper Extremity Assessment: Defer to OT evaluation RUE Deficits / Details: noted edema, hx of carpel tunnel release with numbness and stiffness RUE Sensation:  WNL RUE Coordination: WNL    Lower Extremity Assessment Lower Extremity Assessment: Generalized weakness    Cervical / Trunk Assessment Cervical / Trunk Assessment: Normal  Communication   Communication: No difficulties  Cognition Arousal/Alertness: Awake/alert Behavior During Therapy: WFL for tasks assessed/performed Overall Cognitive Status: Impaired/Different from baseline Area of  Impairment: Following commands;Safety/judgement;Awareness;Problem solving;Attention                   Current Attention Level: Sustained   Following Commands: Follows one step commands consistently;Follows one step commands with increased time;Follows multi-step commands inconsistently Safety/Judgement: Decreased awareness of safety;Decreased awareness of deficits Awareness: Emergent Problem Solving: Slow processing;Requires verbal cues General Comments: pt requires increased time to process, problem solve (attempted short blessed, but pt reports completing the same test earlier)       General Comments General comments (skin integrity, edema, etc.): positive orthostatics earlier today, asymptomatic with increased time between positional changes this afternoon    Exercises     Assessment/Plan    PT Assessment Patient needs continued PT services  PT Problem List Decreased strength;Decreased activity tolerance;Decreased balance;Decreased mobility;Decreased coordination;Cardiopulmonary status limiting activity       PT Treatment Interventions DME instruction;Balance training;Gait training;Neuromuscular re-education;Stair training;Patient/family education;Functional mobility training;Therapeutic activities;Therapeutic exercise    PT Goals (Current goals can be found in the Care Plan section)  Acute Rehab PT Goals Patient Stated Goal: to get moving again PT Goal Formulation: With patient Time For Goal Achievement: 09/22/19 Potential to Achieve Goals: Good    Frequency Min 3X/week   Barriers to discharge        Co-evaluation               AM-PAC PT "6 Clicks" Mobility  Outcome Measure Help needed turning from your back to your side while in a flat bed without using bedrails?: A Little Help needed moving from lying on your back to sitting on the side of a flat bed without using bedrails?: A Little Help needed moving to and from a bed to a chair (including a  wheelchair)?: A Little Help needed standing up from a chair using your arms (e.g., wheelchair or bedside chair)?: A Little Help needed to walk in hospital room?: A Little Help needed climbing 3-5 steps with a railing? : A Lot 6 Click Score: 17    End of Session   Activity Tolerance: Patient tolerated treatment well Patient left: in bed;with call bell/phone within reach;Other (comment)(MD present and attending) Nurse Communication: Mobility status;Other (comment)(patient sitting at EOB per his request) PT Visit Diagnosis: Unsteadiness on feet (R26.81);Difficulty in walking, not elsewhere classified (R26.2);Muscle weakness (generalized) (M62.81)    Time: 4010-2725 PT Time Calculation (min) (ACUTE ONLY): 10 min   Charges:   PT Evaluation $PT Eval Low Complexity: 1 Low          Windell Norfolk, DPT, PN1   Supplemental Physical Therapist [place]    Pager [phone] Acute Rehab Office [phone]

## 2019-09-09 ENCOUNTER — Institutional Professional Consult (permissible substitution): Payer: Medicare Other | Admitting: Internal Medicine

## 2019-09-09 DIAGNOSIS — N184 Chronic kidney disease, stage 4 (severe): Secondary | ICD-10-CM | POA: Diagnosis not present

## 2019-09-09 DIAGNOSIS — G9340 Encephalopathy, unspecified: Secondary | ICD-10-CM | POA: Diagnosis not present

## 2019-09-09 DIAGNOSIS — I428 Other cardiomyopathies: Secondary | ICD-10-CM | POA: Diagnosis not present

## 2019-09-09 LAB — CBC
HCT: 41.9 % (ref 39.0–52.0)
Hemoglobin: 13.6 g/dL (ref 13.0–17.0)
MCH: 30.8 pg (ref 26.0–34.0)
MCHC: 32.5 g/dL (ref 30.0–36.0)
MCV: 95 fL (ref 80.0–100.0)
Platelets: 314 10*3/uL (ref 150–400)
RBC: 4.41 MIL/uL (ref 4.22–5.81)
RDW: 16.8 % — ABNORMAL HIGH (ref 11.5–15.5)
WBC: 7.5 10*3/uL (ref 4.0–10.5)
nRBC: 0 % (ref 0.0–0.2)

## 2019-09-09 LAB — BASIC METABOLIC PANEL
Anion gap: 10 (ref 5–15)
BUN: 31 mg/dL — ABNORMAL HIGH (ref 8–23)
CO2: 27 mmol/L (ref 22–32)
Calcium: 8.7 mg/dL — ABNORMAL LOW (ref 8.9–10.3)
Chloride: 96 mmol/L — ABNORMAL LOW (ref 98–111)
Creatinine, Ser: 1.77 mg/dL — ABNORMAL HIGH (ref 0.61–1.24)
GFR calc Af Amer: 43 mL/min — ABNORMAL LOW (ref >60)
GFR calc non Af Amer: 37 mL/min — ABNORMAL LOW (ref >60)
Glucose, Bld: 119 mg/dL — ABNORMAL HIGH (ref 70–99)
Potassium: 3.6 mmol/L (ref 3.5–5.1)
Sodium: 133 mmol/L — ABNORMAL LOW (ref 135–145)

## 2019-09-09 LAB — PROTIME-INR
INR: 2 — ABNORMAL HIGH (ref 0.8–1.2)
Prothrombin Time: 22.4 seconds — ABNORMAL HIGH (ref 11.4–15.2)

## 2019-09-09 LAB — HIV ANTIBODY (ROUTINE TESTING W REFLEX): HIV Screen 4th Generation wRfx: NONREACTIVE

## 2019-09-09 LAB — SEDIMENTATION RATE: Sed Rate: 3 mm/hr (ref 0–16)

## 2019-09-09 LAB — FOLATE RBC
Folate, Hemolysate: >620 ng/mL
Folate, RBC: >1623 ng/mL (ref >498)
Hematocrit: 38.2 % (ref 37.5–51.0)

## 2019-09-09 LAB — TSH: TSH: 7.194 u[IU]/mL — ABNORMAL HIGH (ref 0.350–4.500)

## 2019-09-09 LAB — FOLATE: Folate: 8.7 ng/mL (ref >5.9)

## 2019-09-09 LAB — VITAMIN B12: Vitamin B-12: 679 pg/mL (ref 180–914)

## 2019-09-09 MED ORDER — FUROSEMIDE 80 MG PO TABS: 80.0000 mg | ORAL_TABLET | Freq: Two times a day (BID) | ORAL | 0 refills | Status: DC

## 2019-09-09 NOTE — Progress Notes (Signed)
Sawyer Sean to be D/C'd Home per MD order.  Discussed with the patient and all questions fully answered.   VSS, Skin clean, dry and intact without evidence of skin break down, no evidence of skin tears noted. IV catheter discontinued intact. Site without signs and symptoms of complications. Dressing and pressure applied.   An After Visit Summary was printed and given to the patient.    D/C education completed with patient/family including follow up instructions, medication list, d/c activities limitations if indicated, with other d/c instructions as indicated by MD - patient able to verbalize understanding, all questions fully answered.    Patient instructed to return to ED, call 911, or call MD for any changes in condition.    Patient escorted via [place], and D/C home via private car with daughter.

## 2019-09-09 NOTE — Procedures (Addendum)
Patient Name: Charles Mcmahon  MRN: [id number]  Epilepsy Attending: Lora Havens  Referring Physician/Provider: Burnetta Sabin, NP Date: 09/08/2019  Duration: 22.39mins  Patient history: 73yo M with ams. EEG to evaluate for seizure  Level of alertness: awake  AEDs during EEG study: None  Technical aspects: This EEG study was done with scalp electrodes positioned according to the 10-20 International system of electrode placement. Electrical activity was acquired at a sampling rate of 500Hz  and reviewed with a high frequency filter of 70Hz  and a low frequency filter of 1Hz . EEG data were recorded continuously and digitally stored.   DESCRIPTION: During awake state, no clear posterior dominant rhythm was seen. EEG showed 15 to 18 Hz, 2-3 uV beta activity with irregular morphology distributed symmetrically and diffusely. Hyperventilation and photic stimulation were not performed.  IMPRESSION: This study is within normal limits. No seizures or epileptiform discharges were seen throughout the recording.  [name] [name]

## 2019-09-09 NOTE — Progress Notes (Signed)
Patient refused to have cardiac monitor on, despite the education given. On call provider notified.

## 2019-09-09 NOTE — Progress Notes (Signed)
Physical Therapy Treatment Patient Details Name: Charles Mcmahon MRN: [id number] DOB: [birth year] Today's Date: 09/09/2019    History of Present Illness Pt is a 73 y/o male history of nonischemic cardiomyopathy with history of complete heart block status post pacemaker placement, CKD IV, atrial fibrillation, R TKR, presenting with increasing confusion.  CT negative, unable to perform MRI.     PT Comments    Pt progressing with functional mobility training this session, able to tolerate increased ambulation distance with SPC up to 62 ft, worked on higher level balance challenges including backwards walking and gait with head turns, all with min guard-min assist. Pt also instructed in sit<>stands without UE support and standing hip abduction exercises for HEP and LE strengthening. Pt continues to benefit from home health follow up therapy in order to maximize functional independence with mobility.   Follow Up Recommendations  Home health PT     Equipment Recommendations  3in1 (PT)    Recommendations for Other Services       Precautions / Restrictions Precautions Precautions: Fall Restrictions Weight Bearing Restrictions: No    Mobility  Bed Mobility Overal bed mobility: Modified Independent             General bed mobility comments: mod I for supine<>sitting this session, cues for techniques  Transfers Overall transfer level: Needs assistance Equipment used: Straight cane Transfers: Sit to/from Stand Sit to Stand: Min guard         General transfer comment: min guard for safety/balance   Ambulation/Gait Ambulation/Gait assistance: Min guard Gait Distance (Feet): 80 Feet Assistive device: Straight cane Gait Pattern/deviations: Step-through pattern;Decreased stride length;Decreased step length - right;Decreased step length - left Gait velocity: decreased   General Gait Details: slow but steady with SPC at self-selected pace; min guard assist for  safety/balance       Balance Overall balance assessment: Needs assistance Sitting-balance support: Feet supported Sitting balance-Leahy Scale: Good Sitting balance - Comments: Mod I sitting balance   Standing balance support: During functional activity;Single extremity supported Standing balance-Leahy Scale: Fair Standing balance comment: use of SPC for balance, performed higher level balance challenges this session including backwards ambulation and gait with head turns, up to min assist for dynamic balance             High level balance activites: Backward walking;Direction changes;Head turns              Cognition Arousal/Alertness: Awake/alert Behavior During Therapy: WFL for tasks assessed/performed Overall Cognitive Status: Within Functional Limits for tasks assessed                                        Exercises Other Exercises Other Exercises: Pt performed 2 x 5 sit<>stands without UE support for LE strengthening, cues for techniques. Pt performed standing hip abduction with B UE support on chair back x 10 for hip strengthening.      PT Goals (current goals can now be found in the care plan section) Progress towards PT goals: Progressing toward goals    Frequency    Min 3X/week      PT Plan Current plan remains appropriate    Co-evaluation              AM-PAC PT "6 Clicks" Mobility   Outcome Measure  Help needed turning from your back to your side while in a flat bed without using bedrails?: A  Little Help needed moving from lying on your back to sitting on the side of a flat bed without using bedrails?: A Little Help needed moving to and from a bed to a chair (including a wheelchair)?: A Little Help needed standing up from a chair using your arms (e.g., wheelchair or bedside chair)?: A Little Help needed to walk in hospital room?: A Little Help needed climbing 3-5 steps with a railing? : A Little 6 Click Score: 18     End of Session Equipment Utilized During Treatment: Gait belt Activity Tolerance: Patient tolerated treatment well Patient left: in bed;with call bell/phone within reach;Other (comment);with family/visitor present Nurse Communication: Mobility status PT Visit Diagnosis: Unsteadiness on feet (R26.81);Difficulty in walking, not elsewhere classified (R26.2);Muscle weakness (generalized) (M62.81)     Time: 9604-5409 PT Time Calculation (min) (ACUTE ONLY): 10 min  Charges:  $Therapeutic Activity: 8-22 mins                     Netta Corrigan, PT, DPT, CSRS Acute Rehab Office [place] 09/09/2019, 11:20 AM

## 2019-09-09 NOTE — Progress Notes (Signed)
STROKE TEAM PROGRESS NOTE   INTERVAL HISTORY Patient is sitting up comfortably in bed.  He states he is feeling fine and has no complaints.  A repeat CT scan of the head was done yesterday evening and shows no acute abnormality.  EEG was also done and was normal without epileptiform activity.  Patient wants to go home.  There are no neurological changes. Vitals:   09/08/19 1222 09/08/19 1807 09/09/19 0002 09/09/19 0415  BP:  105/66 (!) 106/59 104/62  Pulse: (!) 59 70 69 60  Resp:  16 18 16   Temp:  98.2 F (36.8 C)    TempSrc:  Oral    SpO2:  96% 97% 94%  Weight:      Height:        CBC:  Recent Labs  Lab 09/07/19 1624 09/07/19 1655 09/08/19 0642  WBC 5.6  --  5.0  NEUTROABS 4.3  --  3.7  HGB 13.6 14.6 12.9*  HCT 41.4 43.0 38.0*  MCV 94.3  --  92.5  PLT 286  --  341    Basic Metabolic Panel:  Recent Labs  Lab 09/08/19 0642 09/09/19 0411  NA 133* 133*  K 3.5 3.6  CL 94* 96*  CO2 27 27  GLUCOSE 115* 119*  BUN 29* 31*  CREATININE 1.74* 1.77*  CALCIUM 8.7* 8.7*  MG 2.1  --    Lipid Panel:     Component Value Date/Time   CHOL 182 09/08/2019 0642   TRIG 120 09/08/2019 0642   HDL 60 09/08/2019 0642   CHOLHDL 3.0 09/08/2019 0642   VLDL 24 09/08/2019 0642   LDLCALC 98 09/08/2019 0642   HgbA1c:  Lab Results  Component Value Date   HGBA1C 5.9 (H) 09/08/2019   Urine Drug Screen: No results found for: LABOPIA, COCAINSCRNUR, LABBENZ, AMPHETMU, THCU, LABBARB  Alcohol Level No results found for: ETH  IMAGING Ct Head Wo Contrast  Result Date: 09/08/2019 CLINICAL DATA:  Stroke follow-up EXAM: CT HEAD WITHOUT CONTRAST TECHNIQUE: Contiguous axial images were obtained from the base of the skull through the vertex without intravenous contrast. COMPARISON:  09/07/2019 FINDINGS: Brain: No evidence of acute infarction, hemorrhage, hydrocephalus, extra-axial collection or mass lesion/mass effect. Periventricular white matter hypodensity and global volume loss. Vascular: No  hyperdense vessel or unexpected calcification. Skull: Normal. Negative for fracture or focal lesion. Sinuses/Orbits: No acute finding. Status post bilateral maxillary antrostomy. Other: None. IMPRESSION: No acute intracranial pathology. No non-contrast CT evidence of acute stroke or hemorrhage. Small-vessel white matter disease. Electronically Signed   By: Eddie Candle M.D.   On: 09/08/2019 12:11   Ct Head Wo Contrast  Result Date: 09/07/2019 CLINICAL DATA:  Loss of feeling. EXAM: CT HEAD WITHOUT CONTRAST TECHNIQUE: Contiguous axial images were obtained from the base of the skull through the vertex without intravenous contrast. COMPARISON:  None. FINDINGS: Brain: No evidence of acute infarction, hemorrhage, hydrocephalus, extra-axial collection or mass lesion/mass effect. Age related atrophy is noted. Vascular: No hyperdense vessel or unexpected calcification. Skull: Normal. Negative for fracture or focal lesion. Sinuses/Orbits: No acute finding. Other: None. IMPRESSION: No acute intracranial abnormality. Electronically Signed   By: Constance Holster M.D.   On: 09/07/2019 17:03   Vas US Carotid  Result Date: 09/09/2019 Carotid Arterial Duplex Study Indications:       Possibile stroke vs. TIA. Other Factors:     History of chronic A-Fib. Comparison Study:  No priors. Performing Technologist: Oda Cogan RDMS, RVT  Examination Guidelines: A complete evaluation includes B-mode  imaging, spectral Doppler, color Doppler, and power Doppler as needed of all accessible portions of each vessel. Bilateral testing is considered an integral part of a complete examination. Limited examinations for reoccurring indications may be performed as noted.  Right Carotid Findings: +----------+--------+--------+--------+------------------+------------------+           PSV cm/sEDV cm/sStenosisPlaque DescriptionComments           +----------+--------+--------+--------+------------------+------------------+ CCA Prox   71      12                                                   +----------+--------+--------+--------+------------------+------------------+ CCA Distal45      17                                intimal thickening +----------+--------+--------+--------+------------------+------------------+ ICA Prox  29      11      1-39%                     intimal thickening +----------+--------+--------+--------+------------------+------------------+ ICA Distal55      17                                                   +----------+--------+--------+--------+------------------+------------------+ ECA       42      8                                                    +----------+--------+--------+--------+------------------+------------------+ +----------+--------+-------+----------------+-------------------+           PSV cm/sEDV cmsDescribe        Arm Pressure (mmHG) +----------+--------+-------+----------------+-------------------+ [id number]            Multiphasic, WNL                    +----------+--------+-------+----------------+-------------------+ +---------+--------+--+--------+--+---------+ VertebralPSV cm/s29EDV cm/s10Antegrade +---------+--------+--+--------+--+---------+  Left Carotid Findings: +----------+--------+--------+--------+------------------+------------------+           PSV cm/sEDV cm/sStenosisPlaque DescriptionComments           +----------+--------+--------+--------+------------------+------------------+ CCA Prox  64      11                                                   +----------+--------+--------+--------+------------------+------------------+ CCA Mid   45      11                                                   +----------+--------+--------+--------+------------------+------------------+ CCA Distal62      17                                intimal thickening  +----------+--------+--------+--------+------------------+------------------+ ICA Prox  36  10                                intimal thickening +----------+--------+--------+--------+------------------+------------------+ ICA Distal44      15                                                   +----------+--------+--------+--------+------------------+------------------+ ECA       53      11                                                   +----------+--------+--------+--------+------------------+------------------+ +----------+--------+--------+----------------+-------------------+           PSV cm/sEDV cm/sDescribe        Arm Pressure (mmHG) +----------+--------+--------+----------------+-------------------+ [id number]              Multiphasic, WNL                    +----------+--------+--------+----------------+-------------------+ +---------+--------+--+--------+--+---------+ VertebralPSV cm/s34EDV cm/s12Antegrade +---------+--------+--+--------+--+---------+  Summary: Right Carotid: The extracranial vessels were near-normal with only minimal wall                thickening or plaque. Left Carotid: The extracranial vessels were near-normal with only minimal wall               thickening or plaque. Vertebrals: Bilateral vertebral arteries demonstrate antegrade flow. *See table(s) above for measurements and observations.  Electronically signed by Antony Contras MD on 09/09/2019 at 10:44:21 AM.    Final     PHYSICAL EXAM Pleasant elderly Caucasian male not in distress. . Afebrile. Head is nontraumatic. Neck is supple without bruit.    Cardiac exam no murmur or gallop. Lungs are clear to auscultation. Distal pulses are well felt. Neurological Exam :  Awake alert oriented to place and person.  Speech is clear without dysarthria aphasia or apraxia.  Diminished attention, registration and recall.  Poor 3 word recall 0/3. ..  Follows simple midline and one-step and  occasional two-step commands.  Extraocular movements are full range without nystagmus.  Face is symmetric without weakness.  Blinks to threat bilaterally.  Motor system exam reveals symmetric upper and lower extremity strength without focal weakness.  Deep tendon flexes symmetric.  Plantars are downgoing.  ASSESSMENT/PLAN [name] is a 73 y.o. male with history of chronic atrial fibrillation on Coumadin, possible sleep apnea, restless leg, history of UTIs in the past, CKD, coronary artery disease, pacemaker placement for heart failure, presenting with changes in mentation and generalized weakness.   Possible Stroke vs TIA.  Also suspect underlying mild cognitive impairment  CT head No acute abnormality.   Repeat CT head at 4p to confirm/refute infarct   CTA head / neck not done d/t CKD  MRI  / MRA  Not done d/t pacer  Carotid Doppler normal bilaterally  2D Echo diminished left ventricular ejection fraction 20 to 25% with left ventricular hypertrophy with appearance of myocardium concerning for infiltrative process like amyloidosis  EEG normal  LDL 98  HgbA1c 5.9  Urinalysis rare bacteria, 30 protein.    Warfarin for VTE prophylaxis  warfarin daily prior to admission, now on warfarin daily.  Continue at d/c. Not a DOAC candidate given CKD.  Therapy recommendations:  pending   Disposition:  pending   Atrial Fibrillation  Home anticoagulation:  warfarin daily continued in the hospital  Last INR 2.0 . Continue warfarin daily at discharge. Not a DOAC candidate given CKD.   Blood Pressure  Stable, on the low side . BP goal normotensive  Hyperlipidemia  Home meds:  No statin  LDL 98, goal < 70  Add statin if imaging positive for stroke  Continue statin at discharge  Other Stroke Risk Factors  Advanced age  Former Cigarette smoker  ETOH use, advised to drink no more than 2 drink(s) a day  Coronary artery disease  Nonischemic cardiomyopathy on lasix     CHB s/p pacer  Other Active Problems  CKD stage IV   Hypokalemia    Severe protein calorie malnutrition  Elevated alkaline phosphatase  Hospital day # 0     He presented with transient episodes of confusion and inability to figure out and do things and he feels is back to normal but bedside neurological exam does show some some mild cognitive impairment not sure if this is baseline or part of some resolving encephalopathy.   Check dementia panel labs.  Follow-up as an outpatient stroke clinic in 6 weeks.  Discussed with Dr. Eliseo Squires.  Greater than 50% time during this 25-minute visit was spent on counseling and coordination of care about his altered mental status and discussion about TIA versus seizure and answering questions  Antony Contras, MD Medical Director [place] Pager: [phone] 09/09/2019 11:26 AM   To contact Stroke Continuity provider, please refer to [email or web address] After hours, contact General Neurology

## 2019-09-09 NOTE — Discharge Summary (Signed)
Physician Discharge Summary  Charles Mcmahon UDJ:[id number] DOB: 1946-01-29 DOA: 09/07/2019  PCP: [name], [name], MD  Admit date: 09/07/2019 Discharge date: 09/09/2019  Admitted From: home Discharge disposition: home   Recommendations for Outpatient Follow-Up:   1. Referral to ortho for ? Joint injection for gouty tophi 2. Cardiology follow up for echo results 3. No driving until cleared by neurology 4. Dementia panel labs ordered 5. Cbc/bmp 1 week 6. 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  Diet recommendation: Low sodium, heart healthy  Wound care: None.  Code status: Full.   History of Present Illness:   Charles Mcmahon is a 73 y.o. male with history of nonischemic cardiomyopathy with history of complete heart block status post pacemaker placement, chronic kidney disease stage IV, atrial fibrillation on Coumadin who has just recently moved from [place] to [place] 8 months ago to live near his daughter has been found to be increasingly confused and not functioning as usual by his daughter.  Patient was last seen normal about 3 days ago.  Had come to his daughter's house yesterday and was found to be confused not as sharp as he usually is.  Denies losing function of his upper extremity or lower extremity or any visual symptoms.  Given these ongoing symptoms patient was brought to the ER concerning for stroke.   Hospital Course by Problem:    TIA  Vs underlying mild cognitive impairment  CT head No  acute abnormality.   Repeat CT head at 4p to confirm/refute infarct   CTA head / neck not done d/t CKD  MRI  / MRA  Not done d/t pacer  Carotid Doppler normal b/l  2D Echo diminished left ventricular ejection fraction 20 to 25% with left ventricular hypertrophy with appearance of myocardium concerning for infiltrative process like amyloidosis-- follows with cardiology (patient says this is his baseline....)  EEG normal  LDL 98  HgbA1c 5.9  Urinalysis rare bacteria, 30 protein.    Home health   Atrial Fibrillation  Continue warfarin daily at discharge. Not a DOAC candidate given CKD.   Blood Pressure  Stable, on the low side  Hyperlipidemia  LDL 98, goal < 70  Per neuro: Add statin if imaging positive for stroke  gout -on allopurinol -has a painful tophi on right hand hesitant to give steroids PO-- will refer to ortho to see if ? Injection would help.    Medical Consultants:   neurology   Discharge Exam:   Vitals:   09/09/19 0002 09/09/19 0415  BP: (!) 106/59 104/62  Pulse: 69 60  Resp: 18 16  Temp:    SpO2: 97% 94%   Vitals:   09/08/19 1222 09/08/19 1807 09/09/19 0002 09/09/19 0415  BP:  105/66 (!) 106/59 104/62  Pulse: (!) 59 70 69 60  Resp:  16 18 16   Temp:  98.2 F (36.8 C)    TempSrc:  Oral    SpO2:  96% 97% 94%  Weight:      Height:  General exam: Appears calm and comfortable.   The results of significant diagnostics from this hospitalization (including imaging, microbiology, ancillary and laboratory) are listed below for reference.     Procedures and Diagnostic Studies:   Ct Head Wo Contrast  Result Date: 09/08/2019 CLINICAL DATA:  Stroke follow-up EXAM: CT HEAD WITHOUT CONTRAST TECHNIQUE: Contiguous axial images were obtained from the base of the skull through the vertex without intravenous contrast. COMPARISON:  09/07/2019 FINDINGS: Brain: No evidence of acute infarction, hemorrhage, hydrocephalus, extra-axial  collection or mass lesion/mass effect. Periventricular white matter hypodensity and global volume loss. Vascular: No hyperdense vessel or unexpected calcification. Skull: Normal. Negative for fracture or focal lesion. Sinuses/Orbits: No acute finding. Status post bilateral maxillary antrostomy. Other: None. IMPRESSION: No acute intracranial pathology. No non-contrast CT evidence of acute stroke or hemorrhage. Small-vessel white matter disease. Electronically Signed   By: Eddie Candle M.D.   On: 09/08/2019 12:11   Ct Head Wo Contrast  Result Date: 09/07/2019 CLINICAL DATA:  Loss of feeling. EXAM: CT HEAD WITHOUT CONTRAST TECHNIQUE: Contiguous axial images were obtained from the base of the skull through the vertex without intravenous contrast. COMPARISON:  None. FINDINGS: Brain: No evidence of acute infarction, hemorrhage, hydrocephalus, extra-axial collection or mass lesion/mass effect. Age related atrophy is noted. Vascular: No hyperdense vessel or unexpected calcification. Skull: Normal. Negative for fracture or focal lesion. Sinuses/Orbits: No acute finding. Other: None. IMPRESSION: No acute intracranial abnormality. Electronically Signed   By: Constance Holster M.D.   On: 09/07/2019 17:03   Vas US Carotid  Result Date: 09/09/2019 Carotid Arterial Duplex Study Indications:       Possibile stroke vs. TIA. Other Factors:     History of chronic A-Fib. Comparison Study:  No priors. Performing Technologist: Oda Cogan RDMS, RVT  Examination Guidelines: A complete evaluation includes B-mode imaging, spectral Doppler, color Doppler, and power Doppler as needed of all accessible portions of each vessel. Bilateral testing is considered an integral part of a complete examination. Limited examinations for reoccurring indications may be performed as noted.  Right Carotid Findings: +----------+--------+--------+--------+------------------+------------------+           PSV cm/sEDV cm/sStenosisPlaque  DescriptionComments           +----------+--------+--------+--------+------------------+------------------+ CCA Prox  71      12                                                   +----------+--------+--------+--------+------------------+------------------+ CCA Distal45      17                                intimal thickening +----------+--------+--------+--------+------------------+------------------+ ICA Prox  29      11      1-39%                     intimal thickening +----------+--------+--------+--------+------------------+------------------+ ICA Distal55      17                                                   +----------+--------+--------+--------+------------------+------------------+ ECA       42      8                                                    +----------+--------+--------+--------+------------------+------------------+ +----------+--------+-------+----------------+-------------------+  PSV cm/sEDV cmsDescribe        Arm Pressure (mmHG) +----------+--------+-------+----------------+-------------------+ [id number]            Multiphasic, WNL                    +----------+--------+-------+----------------+-------------------+ +---------+--------+--+--------+--+---------+ VertebralPSV cm/s29EDV cm/s10Antegrade +---------+--------+--+--------+--+---------+  Left Carotid Findings: +----------+--------+--------+--------+------------------+------------------+           PSV cm/sEDV cm/sStenosisPlaque DescriptionComments           +----------+--------+--------+--------+------------------+------------------+ CCA Prox  64      11                                                   +----------+--------+--------+--------+------------------+------------------+ CCA Mid   45      11                                                   +----------+--------+--------+--------+------------------+------------------+ CCA Distal62       17                                intimal thickening +----------+--------+--------+--------+------------------+------------------+ ICA Prox  36      10                                intimal thickening +----------+--------+--------+--------+------------------+------------------+ ICA Distal44      15                                                   +----------+--------+--------+--------+------------------+------------------+ ECA       53      11                                                   +----------+--------+--------+--------+------------------+------------------+ +----------+--------+--------+----------------+-------------------+           PSV cm/sEDV cm/sDescribe        Arm Pressure (mmHG) +----------+--------+--------+----------------+-------------------+ [id number]              Multiphasic, WNL                    +----------+--------+--------+----------------+-------------------+ +---------+--------+--+--------+--+---------+ VertebralPSV cm/s34EDV cm/s12Antegrade +---------+--------+--+--------+--+---------+  Summary: Right Carotid: The extracranial vessels were near-normal with only minimal wall                thickening or plaque. Left Carotid: The extracranial vessels were near-normal with only minimal wall               thickening or plaque. Vertebrals: Bilateral vertebral arteries demonstrate antegrade flow. *See table(s) above for measurements and observations.  Electronically signed by Antony Contras MD on 09/09/2019 at 10:44:21 AM.    Final      Labs:   Basic Metabolic Panel: Recent Labs  Lab 09/07/19 1624 09/07/19 1655 09/08/19 0642 09/09/19 0411  NA 133* 131* 133*  133*  K 2.9* 2.9* 3.5 3.6  CL 93* 92* 94* 96*  CO2 27  --  27 27  GLUCOSE 125* 121* 115* 119*  BUN 30* 31* 29* 31*  CREATININE 1.80* 1.70* 1.74* 1.77*  CALCIUM 8.8*  --  8.7* 8.7*  MG  --   --  2.1  --    GFR Estimated Creatinine Clearance: 36.6 mL/min (A) (by C-G  formula based on SCr of 1.77 mg/dL (H)). Liver Function Tests: Recent Labs  Lab 09/07/19 1624 09/08/19 0642  AST 43* 47*  ALT 27 23  ALKPHOS 446* 369*  BILITOT 1.2 1.7*  PROT 5.4* 4.8*  ALBUMIN 2.6* 2.3*   No results for input(s): LIPASE, AMYLASE in the last 168 hours. No results for input(s): AMMONIA in the last 168 hours. Coagulation profile Recent Labs  Lab 09/05/19 09/07/19 1624 09/08/19 0510 09/09/19 0411  INR 4.1* 2.2* 2.0* 2.0*    CBC: Recent Labs  Lab 09/07/19 1624 09/07/19 1655 09/08/19 0642  WBC 5.6  --  5.0  NEUTROABS 4.3  --  3.7  HGB 13.6 14.6 12.9*  HCT 41.4 43.0 38.0*  MCV 94.3  --  92.5  PLT 286  --  253   Cardiac Enzymes: No results for input(s): CKTOTAL, CKMB, CKMBINDEX, TROPONINI in the last 168 hours. BNP: Invalid input(s): POCBNP CBG: Recent Labs  Lab 09/07/19 1927  GLUCAP 130*   D-Dimer No results for input(s): DDIMER in the last 72 hours. Hgb A1c Recent Labs    09/08/19 0647  HGBA1C 5.9*   Lipid Profile Recent Labs    09/08/19 0642  CHOL 182  HDL 60  LDLCALC 98  TRIG 120  CHOLHDL 3.0   Thyroid function studies No results for input(s): TSH, T4TOTAL, T3FREE, THYROIDAB in the last 72 hours.  Invalid input(s): FREET3 Anemia work up Recent Labs    09/07/19 2200  [id number]   Microbiology Recent Results (from the past 240 hour(s))  SARS CORONAVIRUS 2 (TAT 6-24 HRS) Nasopharyngeal Nasopharyngeal Swab     Status: None   Collection Time: 09/08/19  1:11 AM   Specimen: Nasopharyngeal Swab  Result Value Ref Range Status   SARS Coronavirus 2 NEGATIVE NEGATIVE Final    Comment: (NOTE) SARS-CoV-2 target nucleic acids are NOT DETECTED. The SARS-CoV-2 RNA is generally detectable in upper and lower respiratory specimens during the acute phase of infection. Negative results do not preclude SARS-CoV-2 infection, do not rule out co-infections with other pathogens, and should not be used as the sole basis for treatment or  other patient management decisions. Negative results must be combined with clinical observations, patient history, and epidemiological information. The expected result is Negative. Fact Sheet for Patients: SugarRoll.be Fact Sheet for Healthcare Providers: https://www.woods-mathews.com/ This test is not yet approved or cleared by the [place] FDA and  has been authorized for detection and/or diagnosis of SARS-CoV-2 by FDA under an Emergency Use Authorization (EUA). This EUA will remain  in effect (meaning this test can be used) for the duration of the COVID-19 declaration under Section 56 4(b)(1) of the Act, 21 U.S.C. section 360bbb-3(b)(1), unless the authorization is terminated or revoked sooner. Performed at [facility], [place] [address], [place], [place] [address]      Discharge Instructions:   Discharge Instructions    (HEART FAILURE PATIENTS) Call MD:  Anytime you have any of the following symptoms: 1) 3 pound weight gain in 24 hours or 5 pounds in 1 week 2) shortness of breath, with  or without a dry hacking cough 3) swelling in the hands, feet or stomach 4) if you have to sleep on extra pillows at night in order to breathe.   Complete by: As directed    Ambulatory referral to Neurology   Complete by: As directed    An appointment is requested in approximately: 4 weeks   Ambulatory referral to Orthopedic Surgery   Complete by: As directed    Painful gout tophi   Call MD for:  persistant dizziness or light-headedness   Complete by: As directed    Driving Restrictions   Complete by: As directed    Until follow up with neurology   Increase activity slowly   Complete by: As directed      Allergies as of 09/09/2019      Reactions   Gadolinium Derivatives Nausea And Vomiting   Reported by [facility] 04/23/2009      Medication List    STOP taking these medications   ondansetron 4 MG tablet Commonly known as:  Zofran     TAKE these medications   acetaminophen 500 MG tablet Commonly known as: TYLENOL Take 1,000 mg by mouth every 8 (eight) hours.   albuterol 108 (90 Base) MCG/ACT inhaler Commonly known as: VENTOLIN HFA Inhale 2 puffs into the lungs every 6 (six) hours as needed for wheezing or shortness of breath.   allopurinol 100 MG tablet Commonly known as: ZYLOPRIM Take 100 mg by mouth 2 (two) times daily.   finasteride 5 MG tablet Commonly known as: PROSCAR Take 5 mg by mouth every morning.   furosemide 80 MG tablet Commonly known as: LASIX Take 1 tablet (80 mg total) by mouth 2 (two) times daily. What changed: Another medication with the same name was removed. Continue taking this medication, and follow the directions you see here.   metolazone 5 MG tablet Commonly known as: ZAROXOLYN Take 1 tablet (5 mg total) by mouth daily as needed. What changed: reasons to take this   OVER THE COUNTER MEDICATION Apply 1 application topically 3 (three) times daily as needed (pain). CBD Cream   OVER THE COUNTER MEDICATION Place 1 drop into both eyes 2 (two) times daily as needed (itching/irritation). Similasin allergy eye relief   warfarin 2.5 MG tablet Commonly known as: COUMADIN Take as directed. If you are unsure how to take this medication, talk to your nurse or doctor. Original instructions: Take 1.25-2.5 mg by mouth See admin instructions. Take one tablet (2.5 mg) by mouth on Sunday, Tuesday, Thursday at 7pm, take 1/2 tablet (1.25 mg) on Monday, Wednesday, Friday, Saturday at 7pm            Durable Medical Equipment  (From admission, onward)         Start     Ordered   09/08/19 1626  For home use only DME 3 n 1  Once     11 /23/20 1626         Follow-up Information    [facility] Follow up.   Specialty: Home Health Services Contact information: [place] [address] [phone]        [name], [name], MD Follow up in 1 week(s).    Specialty: Family Medicine Contact information: [place] [address] [phone]        Buford Dresser, MD .   Specialty: Cardiology Contact information: [address] [place] [address] [phone]  Time coordinating discharge: 25 min  Signed:  Geradine Girt DO  [facility] 09/09/2019, 10:49 AM

## 2019-09-10 ENCOUNTER — Telehealth: Payer: Self-pay | Admitting: Family Medicine

## 2019-09-10 LAB — RPR: RPR Ser Ql: NONREACTIVE

## 2019-09-10 LAB — POCT INR: INR: 2.3 (ref 2.0–3.0)

## 2019-09-10 NOTE — Telephone Encounter (Signed)
Verbal orders can be given for requested services. Thanks, BJ

## 2019-09-10 NOTE — Telephone Encounter (Signed)
Home Health Verbal Orders - Caller/Agency: [facility] Callback Number: [phone] secure line  Requesting OT/PT/Skilled Nursing/Social Work/Speech Therapy: PT Frequency: 3x for 2wks, 2x for 1wk

## 2019-09-10 NOTE — Telephone Encounter (Signed)
Ok for orders? 

## 2019-09-15 NOTE — Telephone Encounter (Signed)
Orders have been given.  

## 2019-09-16 ENCOUNTER — Encounter: Payer: Self-pay | Admitting: Family Medicine

## 2019-09-16 NOTE — Telephone Encounter (Signed)
Copied from [place] [id number]. Topic: General - Other >> Sep 12, 2019 10:52 AM Carolyn Stare wrote: Charles Mcmahon with [facility] will move pt Eval to nex week per pt req

## 2019-09-17 ENCOUNTER — Ambulatory Visit (INDEPENDENT_AMBULATORY_CARE_PROVIDER_SITE_OTHER): Payer: Medicare Other | Admitting: Orthopaedic Surgery

## 2019-09-17 ENCOUNTER — Ambulatory Visit (INDEPENDENT_AMBULATORY_CARE_PROVIDER_SITE_OTHER): Payer: Medicare Other | Admitting: General Practice

## 2019-09-17 ENCOUNTER — Other Ambulatory Visit: Payer: Self-pay

## 2019-09-17 ENCOUNTER — Ambulatory Visit: Payer: Self-pay

## 2019-09-17 ENCOUNTER — Other Ambulatory Visit: Payer: Self-pay | Admitting: Family Medicine

## 2019-09-17 DIAGNOSIS — M79641 Pain in right hand: Secondary | ICD-10-CM | POA: Diagnosis not present

## 2019-09-17 DIAGNOSIS — Z7901 Long term (current) use of anticoagulants: Secondary | ICD-10-CM | POA: Diagnosis not present

## 2019-09-17 DIAGNOSIS — H9190 Unspecified hearing loss, unspecified ear: Secondary | ICD-10-CM

## 2019-09-17 NOTE — Patient Instructions (Addendum)
Pre visit review using our clinic review tool, if applicable. No additional management support is needed unless otherwise documented below in the visit note.  Continue to take  2.5 mg daily except 1.25 mg on Mon Wed and Friday and Saturday.   Re-check in 2 weeks.     Patient is using [employer] me monitoring.  Patient verbalized dosing instructions.

## 2019-09-17 NOTE — Progress Notes (Signed)
Office Visit Note   Patient: Charles Mcmahon           Date of Birth: [birth year]           MRN: [id number] Visit Date: 09/17/2019              Requested by: Geradine Girt, DO [place],  [place] [address] PCP: [name], [name], MD   Assessment & Plan: Visit Diagnoses:  1. Pain in right hand     Plan: Impression is right hand pain and swelling.  We will obtain nerve conduction study as well as arthritis panel today to look for evidence of poorly controlled gout or autoimmune disorders.  We will see the patient back after the nerve conduction studies.  Questions encouraged and answered.  I recommended that he take his diuretic as prescribed and to keep the hand elevated at all times.  Follow-Up Instructions: Return if symptoms worsen or fail to improve.   Orders:  Orders Placed This Encounter  Procedures  . XR Hand Complete Right  . Uric acid  . Sed Rate (ESR)  . Antinuclear Antib (ANA)  . Rheumatoid Factor  . Ambulatory referral to Physical Medicine Rehab   No orders of the defined types were placed in this encounter.     Procedures: No procedures performed   Clinical Data: No additional findings.   Subjective: Chief Complaint  Patient presents with  . Right Hand - Pain    Charles Mcmahon is a 73 year old gentleman comes in for evaluation of right hand pain for approximately 1 year.  He states that he has numbness in his right thumb which has gotten worse.  He does endorse significant swelling but he is on a diuretic which she has not taken today.  He does have a history of gout for which she is on allopurinol.  He denies any constitutional symptoms.  He is left-hand-dominant.  He takes Tylenol which helps with the pain.  He is on Coumadin for TIA in the past.  He states that he has pain and difficulty with flexion of the ring and small fingers.  Denies any injuries.   Review of Systems  Constitutional: Negative.   All other systems reviewed and are  negative.    Objective: Vital Signs: There were no vitals taken for this visit.  Physical Exam Vitals signs and nursing note reviewed.  Constitutional:      Appearance: He is well-developed.  HENT:     Head: Normocephalic and atraumatic.  Eyes:     Pupils: Pupils are equal, round, and reactive to light.  Neck:     Musculoskeletal: Neck supple.  Pulmonary:     Effort: Pulmonary effort is normal.  Abdominal:     Palpations: Abdomen is soft.  Musculoskeletal: Normal range of motion.  Skin:    General: Skin is warm.  Neurological:     Mental Status: He is alert and oriented to person, place, and time.  Psychiatric:        Behavior: Behavior normal.        Thought Content: Thought content normal.        Judgment: Judgment normal.     Ortho Exam Right hand and upper extremity exam shows moderately severe swelling up to the elbow.  He has good distal pulses.  No evidence of cellulitis or infection.  He has decreased sensation in the thumb.  He states he has burning in his hand.  Is unable to make  a full fist secondary to the swelling.  No motor or sensory deficits. Specialty Comments:  No specialty comments available.  Imaging: Xr Hand Complete Right  Result Date: 09/17/2019 No acute abnormalities.  Mild arthritis of the fingers.    PMFS History: Patient Active Problem List   Diagnosis Date Noted  . Acute encephalopathy 09/07/2019  . Chronic heart failure ([place]) 08/07/2019  . 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 Heart block AV complete ([place]) 08/07/2019  . 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 Nonischemic cardiomyopathy ([place]) 08/07/2019  . CKD (chronic kidney disease) stage 4, GFR 15-29 ml/min (H[facility]) 08/07/2019  . Hypertension with heart disease 06/17/2019  . Elevated alkaline phosphatase level 06/17/2019  . Kidney disease 06/17/2019  . Chronic anticoagulation 06/17/2019  . Long term (current) use of anticoagulants  06/17/2019  . Permanent atrial fibrillation ([place]) 06/17/2019   Past Medical History:  Diagnosis Date  . Allergy   . Arthritis   . Asthma   . Cancer ([place])   . Cardiac arrhythmia due to congenital heart disease   . Coronary artery disease   . Heart disease   . Heart disease   . Heart murmur   . Hx: UTI (urinary tract infection)     Family History  Problem Relation Age of Onset  . Arthritis Mother     Past Surgical History:  Procedure Laterality Date  . CHOLECYSTECTOMY  2008  . HERNIA REPAIR  2019   right side anguler  . MELANOMA EXCISION Right 1993   right arm  . PACEMAKER IMPLANT    . REPLACEMENT TOTAL KNEE Right    Social History   Occupational History  . Not on file  Tobacco Use  . Smoking status: Former Research scientist (life sciences)  . Smokeless tobacco: Never Used  Substance and Sexual Activity  . Alcohol use: Yes  . Drug use: Never  . Sexual activity: Not Currently

## 2019-09-19 ENCOUNTER — Encounter: Payer: Self-pay | Admitting: Family Medicine

## 2019-09-19 ENCOUNTER — Ambulatory Visit (INDEPENDENT_AMBULATORY_CARE_PROVIDER_SITE_OTHER): Payer: Medicare Other | Admitting: General Practice

## 2019-09-19 DIAGNOSIS — Z7901 Long term (current) use of anticoagulants: Secondary | ICD-10-CM

## 2019-09-19 LAB — POCT INR: INR: 3.5 — AB (ref 2.0–3.0)

## 2019-09-19 NOTE — Patient Instructions (Signed)
Pre visit review using our clinic review tool, if applicable. No additional management support is needed unless otherwise documented below in the visit note.  Hold coumadin today (12/4) and then change dosage and take  1.25 mg daily except take 1 tablet (2.5 mg) on Sun and Thursdays.  Re-check in 1 week. Patient is using [employer] me monitoring.  Patient verbalized dosing instructions.

## 2019-09-19 NOTE — Progress Notes (Signed)
Medical screening examination/treatment/procedure(s) were performed by non-physician practitioner and as supervising physician I was immediately available for consultation/collaboration. I agree with above. James John, MD   

## 2019-09-23 ENCOUNTER — Ambulatory Visit (INDEPENDENT_AMBULATORY_CARE_PROVIDER_SITE_OTHER): Payer: Medicare Other | Admitting: General Practice

## 2019-09-23 ENCOUNTER — Encounter: Payer: Self-pay | Admitting: Family Medicine

## 2019-09-23 DIAGNOSIS — Z7901 Long term (current) use of anticoagulants: Secondary | ICD-10-CM

## 2019-09-23 LAB — POCT INR: INR: 2.2 (ref 2.0–3.0)

## 2019-09-23 NOTE — Patient Instructions (Signed)
Pre visit review using our clinic review tool, if applicable. No additional management support is needed unless otherwise documented below in the visit note.  Continue to take  1.25 mg daily except take 1 tablet (2.5 mg) on Sun and Thursdays.  Re-check in 1 week. Patient is using [employer] me monitoring.  Patient verbalized dosing instructions.

## 2019-09-23 NOTE — Progress Notes (Signed)
Medical screening examination/treatment/procedure(s) were performed by non-physician practitioner and as supervising physician I was immediately available for consultation/collaboration. I agree with above. Cavon Nicolls, MD   

## 2019-09-24 ENCOUNTER — Encounter: Payer: Self-pay | Admitting: Family Medicine

## 2019-09-24 ENCOUNTER — Telehealth (INDEPENDENT_AMBULATORY_CARE_PROVIDER_SITE_OTHER): Payer: Medicare Other | Admitting: Family Medicine

## 2019-09-24 ENCOUNTER — Other Ambulatory Visit: Payer: Self-pay

## 2019-09-24 DIAGNOSIS — M79644 Pain in right finger(s): Secondary | ICD-10-CM

## 2019-09-24 MED ORDER — ALBUTEROL SULFATE HFA 108 (90 BASE) MCG/ACT IN AERS: 2.0000 | INHALATION_SPRAY | Freq: Four times a day (QID) | RESPIRATORY_TRACT | 3 refills | Status: DC | PRN

## 2019-09-24 MED ORDER — TRAMADOL HCL 50 MG PO TABS: 50.0000 mg | ORAL_TABLET | Freq: Two times a day (BID) | ORAL | 0 refills | Status: DC | PRN

## 2019-09-24 NOTE — Progress Notes (Signed)
Virtual Visit via Telephone Note  I connected with Preston A Amorin on 09/24/19 at  3:00 PM EST by telephone and verified that I am speaking with the correct person using two identifiers.   I discussed the limitations, risks, security and privacy concerns of performing an evaluation and management service by telephone and the availability of in person appointments. I also discussed with the patient that there may be a patient responsible charge related to this service. The patient expressed understanding and agreed to proceed.  Location patient: home Location provider: work office Participants present for the call: patient, provider Patient did not have a visit in the prior 7 days to address this/these issue(s).   History of Present Illness: Mr Nephew is a 73 yo male with Hx of CKD IV, HTN,CHF, and atrial fib on anticoagulation who is c/o at least 3-4 months of right thumb pain. Pain is severe, constant, exacerbated by palpation. He has been evaluated with orthopedist, blood work done, results are pending. Right hand x-ray on 09/17/2019: Mild arthritis of the fingers. He states that etiology is unknown, as treatment is gout. Denies prior history of gout. He has not noted relation with certain food intake. No history of trauma.  He states that "they assumed it was gout", so started on Allopurinol 100 mg daily, which has been prescribed by nephrologist for about 3-4 months.  He has not noted any change since his been taking allopurinol. He denies fever, chills, unusual fatigue. He denies joint edema or erythema. He is taking Tylenol, which is not helping much.  Right thumb, following with ortho. Tip swollen , like a "bump" in fingertip.   CBD cream helps some.   Observations/Objective: Patient sounds cheerful and well on the phone. I do not appreciate any SOB. Speech and thought processing are grossly intact. Patient reported vitals:N/A  Assessment and Plan: 1. Thumb pain, right He  describes a "lump" in fingertip,tender, ? Tophi. Chronic problem, reviewing records he has reported 1 year hx. Differential dx's discussed. Allopurinol has npt helped. We discussed some side effects of allopurinol, given the fact it has not helped, we agreed on not changing dose for now. NASID's are contraindicated and Tylenol not helping, so recommend trying Tramadol 50 mg. 1/2-1 tab bid. We discussed side effects Pending labs done at his orthopedist office.  If Tramadol helps and he decides to continue, we will need to meet and discuss current recommendations in regard to chronic pain management and opioids/opioid like meds. He agrees with plan.  - traMADol (ULTRAM) 50 MG tablet; Take 1 tablet (50 mg total) by mouth every 12 (twelve) hours as needed for up to 10 days.  Dispense: 20 tablet; Refill: 0   Follow Up Instructions:  Return if symptoms worsen or fail to improve.  I did not refer this patient for an OV in the next 24 hours for this/these issue(s).  I discussed the assessment and treatment plan with the patient. He was provided an opportunity to ask questions and all were answered. He agreed with the plan and demonstrated an understanding of the instructions.   The patient was advised to call back or seek an in-person evaluation if the symptoms worsen or if the condition fails to improve as anticipated.  I provided 17 minutes of non-face-to-face time during this encounter.   Kostantinos Tallman [name], MD

## 2019-09-25 ENCOUNTER — Encounter: Payer: Self-pay | Admitting: Internal Medicine

## 2019-09-25 ENCOUNTER — Ambulatory Visit (INDEPENDENT_AMBULATORY_CARE_PROVIDER_SITE_OTHER): Payer: Medicare Other | Admitting: Internal Medicine

## 2019-09-25 ENCOUNTER — Other Ambulatory Visit: Payer: Self-pay

## 2019-09-25 VITALS — BP 116/68 | HR 70 | Ht 69.0 in | Wt 180.0 lb

## 2019-09-25 DIAGNOSIS — I442 Atrioventricular block, complete: Secondary | ICD-10-CM | POA: Diagnosis not present

## 2019-09-25 DIAGNOSIS — I517 Cardiomegaly: Secondary | ICD-10-CM

## 2019-09-25 DIAGNOSIS — I428 Other cardiomyopathies: Secondary | ICD-10-CM

## 2019-09-25 DIAGNOSIS — I5042 Chronic combined systolic (congestive) and diastolic (congestive) heart failure: Secondary | ICD-10-CM

## 2019-09-25 MED ORDER — TORSEMIDE 20 MG PO TABS: 40.0000 mg | ORAL_TABLET | Freq: Two times a day (BID) | ORAL | 3 refills | Status: DC

## 2019-09-25 MED ORDER — CARVEDILOL 3.125 MG PO TABS: 3.1250 mg | ORAL_TABLET | Freq: Two times a day (BID) | ORAL | 3 refills | Status: DC

## 2019-09-25 NOTE — Progress Notes (Signed)
ELECTROPHYSIOLOGY CONSULT NOTE  Patient ID: Charles Mcmahon, MRN: [id number], DOB/AGE: Apr 29, 1946 73 y.o. Admit date: (Not on file) Date of Consult: 09/25/2019  Primary Physician: [name], [name], MD Primary Cardiologist: *      Charles Mcmahon is a 73 y.o. male who is being seen today for the evaluation of ICD at the request of BC[facility].    HPI Charles Mcmahon is a 73 y.o. male seen to establish care for cardiomyopathy in the context of complete heart block.  He underwent pacing (right-sided) in 2010.  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  He carries a diagnosis of atrial fibrillation-chronic, and his device was programmed to VVIR.  Significant dyspnea on exertion.  Significant edema.  There have been issues with we will insufficiency.  No palpitations.  No syncope.  About 6 or 8 months ago he developed swelling of his right arm.  It seems to go down at night.  He is also had carpal tunnel surgery on that right hand.   DATE TEST EF        6/19 LHC    %  Normal CA PPCWP 41  12/19 Echo  27% LVH 17/63mm  3/20 Echo  33% LVH12./16  11/20 Echo   20-25 % LVH  17/23 BAE           Date Cr K Hgb  9+/20 2.38 3.4   11/20 1.77 3.6 13.6         Thromboembolic risk factors ( age -68 , CHF-1) for a CHADSVASc Score of 2   Past Medical History:  Diagnosis Date  . Allergy   . Arthritis   . Asthma   . Cancer ([place])   . Cardiac arrhythmia due to congenital heart disease   . Coronary artery disease   . Heart disease   . Heart disease   . Heart murmur   . Hx: UTI (urinary tract infection)       Surgical History:  Past Surgical History:  Procedure Laterality Date  . CHOLECYSTECTOMY  2008  . HERNIA REPAIR  2019   right side anguler  . MELANOMA EXCISION Right 1993   right arm  . PACEMAKER IMPLANT    . REPLACEMENT TOTAL KNEE Right      Home Meds: Current Meds  Medication Sig  .  acetaminophen (TYLENOL) 500 MG tablet Take 1,000 mg by mouth every 8 (eight) hours.  Marland Kitchen albuterol (VENTOLIN HFA) 108 (90 Base) MCG/ACT inhaler Inhale 2 puffs into the lungs every 6 (six) hours as needed for wheezing or shortness of breath.  . allopurinol (ZYLOPRIM) 100 MG tablet Take 100 mg by mouth 2 (two) times daily.  . finasteride (PROSCAR) 5 MG tablet Take 5 mg by mouth every morning.   . furosemide (LASIX) 80 MG tablet Take 1 tablet (80 mg total) by mouth 2 (two) times daily.  Marland Kitchen OVER THE COUNTER MEDICATION Apply 1 application topically 3 (three) times daily as needed (pain). CBD Cream  . OVER THE COUNTER MEDICATION Place 1 drop into both eyes 2 (two) times daily as needed (itching/irritation). Similasin allergy eye relief  . traMADol (ULTRAM) 50 MG tablet Take 1 tablet (50 mg total) by mouth every 12 (twelve) hours as needed for up to 10 days.  Marland Kitchen warfarin (COUMADIN) 2.5 MG tablet Take 1.25-2.5 mg by mouth See admin instructions. Take one tablet (2.5 mg) by mouth on Sunday, Tuesday, Thursday  at 7pm, take 1/2 tablet (1.25 mg) on Monday, Wednesday, Friday, Saturday at 7pm    Allergies: No Known Allergies  Social History   Socioeconomic History  . Marital status: Married    Spouse name: Not on file  . Number of children: 2  . Years of education: Not on file  . Highest education level: Not on file  Occupational History  . Not on file  Tobacco Use  . Smoking status: Former Research scientist (life sciences)  . Smokeless tobacco: Never Used  Substance and Sexual Activity  . Alcohol use: Yes  . Drug use: Never  . Sexual activity: Not Currently  Other Topics Concern  . Not on file  Social History Narrative  . Not on file   Social Determinants of Health   Financial Resource Strain:   . Difficulty of Paying Living Expenses: Not on file  Food Insecurity:   . Worried About Charity fundraiser in the Last Year: Not on file  . Ran Out of Food in the Last Year: Not on file  Transportation Needs:   . Lack of  Transportation (Medical): Not on file  . Lack of Transportation (Non-Medical): Not on file  Physical Activity:   . Days of Exercise per Week: Not on file  . Minutes of Exercise per Session: Not on file  Stress:   . Feeling of Stress : Not on file  Social Connections:   . Frequency of Communication with Friends and Family: Not on file  . Frequency of Social Gatherings with Friends and Family: Not on file  . Attends Religious Services: Not on file  . Active Member of Clubs or Organizations: Not on file  . Attends Archivist Meetings: Not on file  . Marital Status: Not on file  Intimate Partner Violence:   . Fear of Current or Ex-Partner: Not on file  . Emotionally Abused: Not on file  . Physically Abused: Not on file  . Sexually Abused: Not on file     Family History  Problem Relation Age of Onset  . Arthritis Mother      ROS:  Please see the history of present illness.     All other systems reviewed and negative.    Physical Exam:  Blood pressure 116/68, pulse 70, height 5\' 9"  (1.753 m), weight 180 lb (81.6 kg), SpO2 99 %. General: Well developed, well nourished male in no acute distress. Head: Normocephalic, atraumatic, sclera non-icteric, no xanthomas, nares are without discharge. EENT: normal  Lymph Nodes:  none Neck: Negative for carotid bruits. JVD 8-10+ HJR Back:without scoliosis kyphosis  Lungs: Clear bilaterally to auscultation without wheezes, rales, or rhonchi. Breathing is unlabored. Heart: RRR with S1 S2.  2/6 systolic murmur . No rubs, or gallops appreciated. Abdomen: Soft, non-tender, non-distended with normoactive bowel sounds. No hepatomegaly. No rebound/guarding. No obvious abdominal masses. Msk:  Strength and tone appear normal for age. Extremities: No clubbing or cyanosis.  3+ edema.  Distal pedal pulses are 2+ and equal bilaterally. RUE edema  Skin: Warm and Dry Neuro: Alert and oriented X 3. CN III-XII intact Grossly normal sensory and motor  function . Psych:  Responds to questions appropriately with a normal affect.      Labs: Cardiac Enzymes No results for input(s): CKTOTAL, CKMB, TROPONINI in the last 72 hours. CBC Lab Results  Component Value Date   WBC 7.5 09/09/2019   HGB 13.6 09/09/2019   HCT 41.9 09/09/2019   MCV 95.0 09/09/2019   PLT 314 09/09/2019  PROTIME: Recent Labs    09/23/19 0000  INR 2.2   Chemistry No results for input(s): NA, K, CL, CO2, BUN, CREATININE, CALCIUM, PROT, BILITOT, ALKPHOS, ALT, AST, GLUCOSE in the last 168 hours.  Invalid input(s): LABALBU Lipids Lab Results  Component Value Date   CHOL 182 09/08/2019   HDL 60 09/08/2019   LDLCALC 98 09/08/2019   TRIG 120 09/08/2019   BNP Pro B Natriuretic peptide (BNP)  Date/Time Value Ref Range Status  06/17/2019 09:38 AM 661.0 (H) 0.0 - 100.0 pg/mL Final  05/27/2019 01:18 PM 755.0 (H) 0.0 - 100.0 pg/mL Final   Thyroid Function Tests: No results for input(s): TSH, T4TOTAL, T3FREE, THYROIDAB in the last 72 hours.  Invalid input(s): FREET3 Miscellaneous No results found for: DDIMER  Radiology/Studies:  CT HEAD WO CONTRAST  Result Date: 09/08/2019 CLINICAL DATA:  Stroke follow-up EXAM: CT HEAD WITHOUT CONTRAST TECHNIQUE: Contiguous axial images were obtained from the base of the skull through the vertex without intravenous contrast. COMPARISON:  09/07/2019 FINDINGS: Brain: No evidence of acute infarction, hemorrhage, hydrocephalus, extra-axial collection or mass lesion/mass effect. Periventricular white matter hypodensity and global volume loss. Vascular: No hyperdense vessel or unexpected calcification. Skull: Normal. Negative for fracture or focal lesion. Sinuses/Orbits: No acute finding. Status post bilateral maxillary antrostomy. Other: None. IMPRESSION: No acute intracranial pathology. No non-contrast CT evidence of acute stroke or hemorrhage. Small-vessel white matter disease. Electronically Signed   By: Eddie Candle M.D.   On:  09/08/2019 12:11       EKG: sinus  with complete Heart Block QRS upright V1 and lead 1   Assessment and Plan:   Complete heart block  Cardiomyopathy  Nonischemic  Left ventricular hypertrophy  CHF acute/chronic systolic  RU edema  R sided pacemaker  Atrial fibrillation-persistent  Renal insufficiency    Patient has complete heart block in sinus rhythm.  Atrial fibrillation is described in the old chart is permanent; clearly not.  We have reprogrammed his device DDD.  He is significantly volume overloaded.  We will change his furosemide--torsemide.  We will be relatively ginger as we diurese him as not to aggravate his renal dysfunction.  Right upper extremity swelling is noted all the way up to his upper bicep.  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  He is already on anticoagulation; I have instructed him to raise his arm above his heart as much as possible.  He takes warfarin for anticoagulation for his atrial fibrillation we discussed alternatives including Eliquis.  We will transition from Coumadin--apixaban.  With his cardiomyopathy, he has a little bit of lightheadedness in the morning which may impact afterload reduction but will begin him on low-dose carvedilol at 3.125 mg twice daily.    We will check his metabolic profile in 3 weeks.  At that time, if his blood pressure is adequate, we will begin him on low-dose hydralazine nitrates.  He has significant left ventricular hypertrophy; he does not have a history of hypertension.  He has heard the term "hypertrophic cardiomyopathy "not sure if it was related to him but cannot imagine why else he might of hurt it.  No family history of heart disease of which he is aware.  Has some lightheadedness.  Question is could this also be amyloid if not HCM.  We will arrange for technetium pyrophosphate scan.  Would anticipate genetic clinic  referral       Virl Axe

## 2019-09-25 NOTE — Patient Instructions (Signed)
Medication Instructions:  Stop Lasix and start Torsemide 40mg  by mouth twice daily. Carvedilol 3125mg  by mouth every 12 hours We will have the pharmacist contact you re: coming off of your Warfarin and starting Apixaban.  Labwork: None ordered.   Testing/Procedures:  Dr Caryl Comes would like for you to have an Amyloid Scan  Follow-Up:   3 weeks with an EP APP 6-8 weeks with Dr Caryl Comes    Any Other Special Instructions Will Be Listed Below (If Applicable).     If you need a refill on your cardiac medications before your next appointment, please call your pharmacy.

## 2019-09-26 ENCOUNTER — Other Ambulatory Visit: Payer: Self-pay

## 2019-09-26 MED ORDER — ALBUTEROL SULFATE HFA 108 (90 BASE) MCG/ACT IN AERS: 2.0000 | INHALATION_SPRAY | Freq: Four times a day (QID) | RESPIRATORY_TRACT | 3 refills | Status: DC | PRN

## 2019-09-30 ENCOUNTER — Telehealth (HOSPITAL_COMMUNITY): Payer: Self-pay

## 2019-09-30 ENCOUNTER — Ambulatory Visit: Payer: Self-pay | Admitting: General Practice

## 2019-09-30 DIAGNOSIS — Z7901 Long term (current) use of anticoagulants: Secondary | ICD-10-CM

## 2019-09-30 NOTE — Telephone Encounter (Signed)
COVID-19 pre-appointment screening questions:   Do you have a history of COVID-19 or a positive test result in the past 7-10 days? NO  To the best of your knowledge, have you been in close contact with anyone with a confirmed diagnosis of COVID 19? NO  Have you had any one or more of the following: Fever, chills, cough, shortness of breath (out of the normal for you) or any flu-like symptoms? NO  Are you experiencing any of the following symptoms that is new or out of usual for you: NO  . Ear, nose or throat discomfort . Sore throat . Headache . Muscle Pain . Diarrhea . Loss of taste or smell   Reviewed all the following with patient: REVIEWED WITH PATIENT  . Use of hand sanitizer when entering the building . Everyone is required to wear a mask in the building, if you do not have a mask we are happy to provide you with one when you arrive . NO Visitor guidelines   If patient answers YES to any of questions they must change to a virtual visit and place note in comments about symptoms 

## 2019-10-01 ENCOUNTER — Other Ambulatory Visit: Payer: Self-pay

## 2019-10-01 ENCOUNTER — Telehealth (HOSPITAL_COMMUNITY): Payer: Self-pay | Admitting: *Deleted

## 2019-10-01 ENCOUNTER — Encounter (HOSPITAL_COMMUNITY): Payer: Medicare Other

## 2019-10-01 ENCOUNTER — Other Ambulatory Visit (HOSPITAL_COMMUNITY): Payer: Self-pay | Admitting: *Deleted

## 2019-10-01 ENCOUNTER — Ambulatory Visit (HOSPITAL_COMMUNITY)
Admission: RE | Admit: 2019-10-01 | Discharge: 2019-10-01 | Disposition: A | Discharge status: Home / Self Care | Payer: Medicare Other | Source: Ambulatory Visit | Attending: Cardiology | Admitting: Cardiology

## 2019-10-01 ENCOUNTER — Telehealth (HOSPITAL_COMMUNITY): Payer: Self-pay | Admitting: Cardiology

## 2019-10-01 ENCOUNTER — Encounter (HOSPITAL_COMMUNITY): Payer: Self-pay | Admitting: Cardiology

## 2019-10-01 ENCOUNTER — Ambulatory Visit (HOSPITAL_BASED_OUTPATIENT_CLINIC_OR_DEPARTMENT_OTHER)
Admission: RE | Admit: 2019-10-01 | Discharge: 2019-10-01 | Disposition: A | Discharge status: Home / Self Care | Payer: Medicare Other | Source: Ambulatory Visit | Attending: Cardiology | Admitting: Cardiology

## 2019-10-01 VITALS — BP 105/58 | HR 70 | Wt 176.6 lb

## 2019-10-01 DIAGNOSIS — Z87891 Personal history of nicotine dependence: Secondary | ICD-10-CM | POA: Insufficient documentation

## 2019-10-01 DIAGNOSIS — I959 Hypotension, unspecified: Secondary | ICD-10-CM | POA: Diagnosis not present

## 2019-10-01 DIAGNOSIS — M7989 Other specified soft tissue disorders: Secondary | ICD-10-CM | POA: Diagnosis not present

## 2019-10-01 DIAGNOSIS — Z79899 Other long term (current) drug therapy: Secondary | ICD-10-CM | POA: Diagnosis not present

## 2019-10-01 DIAGNOSIS — R600 Localized edema: Secondary | ICD-10-CM | POA: Diagnosis not present

## 2019-10-01 DIAGNOSIS — N183 Chronic kidney disease, stage 3 unspecified: Secondary | ICD-10-CM | POA: Insufficient documentation

## 2019-10-01 DIAGNOSIS — I428 Other cardiomyopathies: Secondary | ICD-10-CM | POA: Diagnosis not present

## 2019-10-01 DIAGNOSIS — I5042 Chronic combined systolic (congestive) and diastolic (congestive) heart failure: Secondary | ICD-10-CM | POA: Diagnosis not present

## 2019-10-01 DIAGNOSIS — Z950 Presence of cardiac pacemaker: Secondary | ICD-10-CM | POA: Diagnosis not present

## 2019-10-01 DIAGNOSIS — I4821 Permanent atrial fibrillation: Secondary | ICD-10-CM

## 2019-10-01 DIAGNOSIS — I4819 Other persistent atrial fibrillation: Secondary | ICD-10-CM | POA: Diagnosis not present

## 2019-10-01 DIAGNOSIS — M109 Gout, unspecified: Secondary | ICD-10-CM | POA: Insufficient documentation

## 2019-10-01 DIAGNOSIS — I5022 Chronic systolic (congestive) heart failure: Secondary | ICD-10-CM | POA: Diagnosis present

## 2019-10-01 DIAGNOSIS — Z85820 Personal history of malignant melanoma of skin: Secondary | ICD-10-CM | POA: Insufficient documentation

## 2019-10-01 DIAGNOSIS — Z7901 Long term (current) use of anticoagulants: Secondary | ICD-10-CM | POA: Diagnosis not present

## 2019-10-01 DIAGNOSIS — I442 Atrioventricular block, complete: Secondary | ICD-10-CM | POA: Diagnosis not present

## 2019-10-01 LAB — CBC
HCT: 39 % (ref 39.0–52.0)
Hemoglobin: 12.7 g/dL — ABNORMAL LOW (ref 13.0–17.0)
MCH: 31 pg (ref 26.0–34.0)
MCHC: 32.6 g/dL (ref 30.0–36.0)
MCV: 95.1 fL (ref 80.0–100.0)
Platelets: 242 10*3/uL (ref 150–400)
RBC: 4.1 MIL/uL — ABNORMAL LOW (ref 4.22–5.81)
RDW: 18.2 % — ABNORMAL HIGH (ref 11.5–15.5)
WBC: 4.5 10*3/uL (ref 4.0–10.5)
nRBC: 0 % (ref 0.0–0.2)

## 2019-10-01 LAB — BASIC METABOLIC PANEL
Anion gap: 14 (ref 5–15)
BUN: 51 mg/dL — ABNORMAL HIGH (ref 8–23)
CO2: 29 mmol/L (ref 22–32)
Calcium: 8.6 mg/dL — ABNORMAL LOW (ref 8.9–10.3)
Chloride: 96 mmol/L — ABNORMAL LOW (ref 98–111)
Creatinine, Ser: 1.87 mg/dL — ABNORMAL HIGH (ref 0.61–1.24)
GFR calc Af Amer: 40 mL/min — ABNORMAL LOW (ref >60)
GFR calc non Af Amer: 35 mL/min — ABNORMAL LOW (ref >60)
Glucose, Bld: 106 mg/dL — ABNORMAL HIGH (ref 70–99)
Potassium: 2.5 mmol/L — CL (ref 3.5–5.1)
Sodium: 139 mmol/L (ref 135–145)

## 2019-10-01 LAB — PROTIME-INR
INR: 1.4 — ABNORMAL HIGH (ref 0.8–1.2)
Prothrombin Time: 17.4 seconds — ABNORMAL HIGH (ref 11.4–15.2)

## 2019-10-01 MED ORDER — APIXABAN 5 MG PO TABS: 5.0000 mg | ORAL_TABLET | Freq: Two times a day (BID) | ORAL | 6 refills | Status: DC

## 2019-10-01 MED ORDER — TORSEMIDE 20 MG PO TABS: ORAL_TABLET | ORAL | 3 refills | Status: DC

## 2019-10-01 MED ORDER — POTASSIUM CHLORIDE CRYS ER 20 MEQ PO TBCR: EXTENDED_RELEASE_TABLET | ORAL | 6 refills | Status: DC

## 2019-10-01 MED ORDER — SODIUM CHLORIDE 0.9% FLUSH: 3.0000 mL | Freq: Two times a day (BID) | INTRAVENOUS | Status: DC

## 2019-10-01 NOTE — Telephone Encounter (Signed)
Left message per DPR to remind patient of cardiac amyloid test on 10/06/19.

## 2019-10-01 NOTE — Telephone Encounter (Signed)
CRITICAL LAB RESULTS K 2.5  PER VO DR MCLEAN  START POTASSIUM 80 MEQ TOTAL TODAY AND 40 MEQ DAILY THEREAFTER  PATIENT AWARE AND VOICED UNDERSTANDING

## 2019-10-01 NOTE — Patient Instructions (Signed)
STOP take Furosemide  Take Torsemide 80 mg (4 tabs) in AM and 40 mg (2 tabs) in PM  Continue taking your Coumadin for now, we have sent in a prescription for Eliquis 5 mg Twice daily DO NOT START THIS UNTIL WE TELL YOU TO DO SO.  Labs done today, we will notify you for abnormal results  Ultrasound of Right arm today at 4 pm  You have been ordered a PYP Scan.  This is done in the Radiology Department of [facility].  When you come for this test please plan to be there 2-3 hours.  Heart Catheterization on Wed 12/30, see instructions below  Your physician recommends that you schedule a follow-up appointment in: 3 weeks  If you have any questions or concerns before your next appointment please send [place] a message through [place] or call our office at [phone].  At the [facility], you and your health needs are our priority. As part of our continuing mission to provide you with exceptional heart care, we have created designated Provider Care Teams. These Care Teams include your primary Cardiologist (physician) and Advanced Practice Providers (APPs- Physician Assistants and Nurse Practitioners) who all work together to provide you with the care you need, when you need it.   You may see any of the following providers on your designated Care Team at your next follow up: Marland Kitchen Dr Glori Bickers . Dr Loralie Champagne . Darrick Grinder, NP . Lyda Jester, PA . Audry Riles, PharmD   Please be sure to bring in all your medications bottles to every appointment.    HEART CATHETERIZATION INSTRUCTIONS:  You are scheduled for a Cardiac Catheterization on Wednesday, December 30 with Dr. Loralie Champagne.  1. Please arrive at the [facility] (Main Entrance A) at [facility]: [address] [place], [place] [address] at 6:30 AM (This time is two hours before your procedure to ensure your preparation). Free valet parking service is available.   Special note: Every effort is  made to have your procedure done on time. Please understand that emergencies sometimes delay scheduled procedures.  2. Diet: Do not eat solid foods after midnight.  The patient may have clear liquids until 5am upon the day of the procedure.  3. COVID TEST:  PLEASE GO TO [facility] FOR THIS ON Monday 12/28 AT 11 AM:   [employer]   4. Medication instructions in preparation for your procedure:  WE WILL CALL YOU ABOUT WHEN TO STOP YOUR COUMADIN  WED 12/30 DO NOT TAKE TORSEMIDE   5. Plan for one night stay--bring personal belongings. 6. Bring a current list of your medications and current insurance cards. 7. You MUST have a responsible person to drive you home. 8. Someone MUST be with you the first 24 hours after you arrive home or your discharge will be delayed. 9. Please wear clothes that are easy to get on and off and wear slip-on shoes.  Thank you for allowing [place] to care for you!   -- [facility]

## 2019-10-01 NOTE — H&P (View-Only) (Signed)
PCP: [name], [name], MD HF Cardiology: Dr. Aundra Dubin EP: Dr. Caryl Comes  73 y.o. with history of complete heart block s/p pacemaker, persistent atrial fibrillation, and chronic systolic CHF was referred by Dr. Caryl Comes for evaluation of HF.  Patient is originally from [place].  In 2010, he developed complete heart block (given geographic location, would question whether this could be Lyme carditis-related) and had PPM placed.  LV EF was noted to fall over time, thought to be due to RV pacing.  Cath in 4/14 in setting of EF 35% showed no significant coronary disease.  In 12/18, his device was upgraded to [facility] CRT-P (declined ICD), but EF remained low.  Last echo in 11/20 showed EF 20-25%, severe LVH (speckled myocardium), severely decreased RV systolic function. He also has persistent atrial fibrillation (most of the time in atrial fibrillation though NSR noted on at least 1 occasion).    He has been struggling recently with volume overload and low blood pressure.  He had to stop losartan due to hypotension.  He is short of breath walking 50-100 feet.  He cannot carry 5 lbs without becoming short of breath.  He has orthopnea.  No lightheaded, no chest pain. Legs are very swollen.   He saw Dr. Caryl Comes and was supposed to start torsemide 40 mg bid and stop Lasix 80 mg bid.  However, he is taking both.  He says that he has been urinating "a lot."  K is low today though creatinine is fairly stable at 1.87. Finally right arm (pacemaker side) has been swollen for months. I had him get a venous US today of the RUE, this was negative for DVT.   St Jude device interrogation: >99% BiV pacing.  Thoracic impedance not measured.   ECG: v-paced, probably underlying atrial fibrillation.   Labs (11/20): K 3.6, creatinine 1.77 Labs (12/20): K 2.5, creatinine 1.87  PMH: 1. Complete heart block: 2010, ??Lyme carditis. Received PPM.  2. Gout  3. Atrial fibrillation: Paroxysmal.  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  5. Chronic  systolic CHF: Nonischemic cardiomyopathy.  Initially, this was thought to be due to chronic RV pacing.  - Cath in 4/14: EF 35%, no significant CAD.  - St Jude CRT-P upgrade in 6/18 (he declined ICD).  -  Echo (11/20): EF 20-25%, severe LVH with speckled myocardium, mildly dilated RV with severely decreased systolic function, PASP 41 mmHg.  6. H/o melanoma  Social History   Socioeconomic History  . Marital status: Married    Spouse name: Not on file  . Number of children: 2  . Years of education: Not on file  . Highest education level: Not on file  Occupational History  . Not on file  Tobacco Use  . Smoking status: Former Research scientist (life sciences)  . Smokeless tobacco: Never Used  Substance and Sexual Activity  . Alcohol use: Yes  . Drug use: Never  . Sexual activity: Not Currently  Other Topics Concern  . Not on file  Social History Narrative  . Not on file   Social Determinants of Health   Financial Resource Strain:   . Difficulty of Paying Living Expenses: Not on file  Food Insecurity:   . Worried About Charity fundraiser in the Last Year: Not on file  . Ran Out of Food in the Last Year: Not on file  Transportation Needs:   . Lack of Transportation (Medical): Not on file  . Lack of Transportation (Non-Medical): Not on file  Physical Activity:   .  Days of Exercise per Week: Not on file  . Minutes of Exercise per Session: Not on file  Stress:   . Feeling of Stress : Not on file  Social Connections:   . Frequency of Communication with Friends and Family: Not on file  . Frequency of Social Gatherings with Friends and Family: Not on file  . Attends Religious Services: Not on file  . Active Member of Clubs or Organizations: Not on file  . Attends Archivist Meetings: Not on file  . Marital Status: Not on file  Intimate Partner Violence:   . Fear of Current or Ex-Partner: Not on file  . Emotionally Abused: Not on file  . Physically Abused: Not on file  . Sexually Abused: Not  on file   Family history: Grandfather with MI.  No other cardiac disease that he is aware of.   ROS: All systems reviewed and negative except as per HPI.   Current Outpatient Medications  Medication Sig Dispense Refill  . acetaminophen (TYLENOL) 500 MG tablet Take 1,000 mg by mouth every 8 (eight) hours.    Marland Kitchen albuterol (VENTOLIN HFA) 108 (90 Base) MCG/ACT inhaler Inhale 2 puffs into the lungs every 6 (six) hours as needed for wheezing or shortness of breath. 54 g 3  . allopurinol (ZYLOPRIM) 100 MG tablet Take 100 mg by mouth 2 (two) times daily.    . carvedilol (COREG) 3.125 MG tablet Take 1 tablet (3.125 mg total) by mouth 2 (two) times daily. 180 tablet 3  . finasteride (PROSCAR) 5 MG tablet Take 5 mg by mouth every morning.     Marland Kitchen OVER THE COUNTER MEDICATION Apply 1 application topically 3 (three) times daily as needed (pain). CBD Cream    . OVER THE COUNTER MEDICATION Place 1 drop into both eyes 2 (two) times daily as needed (itching/irritation). Similasin allergy eye relief    . torsemide (DEMADEX) 20 MG tablet Take 4 tablets (80 mg total) by mouth every morning AND 2 tablets (40 mg total) every evening. 180 tablet 3  . traMADol (ULTRAM) 50 MG tablet Take 1 tablet (50 mg total) by mouth every 12 (twelve) hours as needed for up to 10 days. 20 tablet 0  . warfarin (COUMADIN) 2.5 MG tablet Take 1.25-2.5 mg by mouth See admin instructions. Take one tablet (2.5 mg) by mouth on Sunday, Tuesday, Thursday at 7pm, take 1/2 tablet (1.25 mg) on Monday, Wednesday, Friday, Saturday at 7pm    . [START ON 10/15/2019] apixaban (ELIQUIS) 5 MG TABS tablet Take 1 tablet (5 mg total) by mouth 2 (two) times daily. 60 tablet 6  . potassium chloride SA (KLOR-CON) 20 MEQ tablet Take 4 tablets (80 mEq total) by mouth daily for 1 day, THEN 2 tablets (40 mEq total) daily. 64 tablet 6   No current facility-administered medications for this encounter.   BP (!) 105/58   Pulse 70   Wt 80.1 kg (176 lb 9.6 oz)   SpO2  99%   BMI 26.08 kg/m  General: NAD Neck: JVP 10-12 cm, no thyromegaly or thyroid nodule.  Lungs: Clear to auscultation bilaterally with normal respiratory effort. CV: Nondisplaced PMI.  Heart regular S1/S2, no S3/S4, 2/6 HSM LLSB.  2+ edema to thighs.  No carotid bruit.  Difficult to palpate pedal pulses with swelling.  Abdomen: Soft, nontender, no hepatosplenomegaly, no distention.  Skin: Intact without lesions or rashes.  Neurologic: Alert and oriented x 3.  Psych: Normal affect. Extremities: No clubbing or cyanosis.  HEENT:  Normal.   Assessment/Plan: 1. Chronic systolic CHF: Nonischemic based on LHC from 2014 in [place].  EF has been low since that time.  Initially thought to be due to RV pacing, but EF did not improve significantly after St Jude CRT-P upgrade in 6/18 (declined ICD).  Echo in 11/20 with EF 20-25%, severe LVH, severely decreased RV systolic function.  He has struggled with volume overload.  Today, he is significantly volume overloaded on exam despite taking BOTH Lasix 80 mg bid and torsemide 40 mg bid.  NYHA class III symptoms.  - Stop Lasix, increase torsemide to 80 mg qam/40 mg qpm.  - Take 80 mEq total KCl today and start KCl 40 mEq daily tomorrow. BMET Friday.  - Continue Coreg 3.125 mg bid.  - Losartan recently stopped due to elevated creatinine and low BP.  Would avoid ARB/ACEI/ARNI for now with CKD.  - If creatinine remains stable, may be able to start spironolactone in the future.  - Marked volume overload so no beta blocker at this point.  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  I will arrange for PYP scan and will send myeloma panel and UFE.   - I think that he needs RHC, will set this up.  He will make transition from warfarin to Eliquis at this time (stop warfarin prior to cath). We discussed risks/benefits and he agrees to procedure. He may need admission for aggressive diuresis afterwards.  - needs to follow low Na and low fluid  diet.  2. Persistent atrial fibrillation: Most of the time, it appears he is in atrial fibrillation.  Paced today with suspected underlying atrial fibrillation.   - Will plan to transition from warfarin to Eliquis 5 mg bid at time of cath.  3. Complete heart block: Occurred in [place], not related to MI.  ?Lyme carditis (no record of whether this was tested or not, he is not sure).  Lyme carditis could also explain the persistently low EF.  4. CKD: Stage 3.  Follow creatinine closely with diuresis.  5. Right arm swelling: Checked today, negative for DVT by doppler US.   Loralie Champagne 10/01/2019

## 2019-10-01 NOTE — Progress Notes (Signed)
PCP: [name], [name], MD HF Cardiology: Dr. Aundra Dubin EP: Dr. Caryl Comes  73 y.o. with history of complete heart block s/p pacemaker, persistent atrial fibrillation, and chronic systolic CHF was referred by Dr. Caryl Comes for evaluation of HF.  Patient is originally from [place].  In 2010, he developed complete heart block (given geographic location, would question whether this could be Lyme carditis-related) and had PPM placed.  LV EF was noted to fall over time, thought to be due to RV pacing.  Cath in 4/14 in setting of EF 35% showed no significant coronary disease.  In 12/18, his device was upgraded to [facility] CRT-P (declined ICD), but EF remained low.  Last echo in 11/20 showed EF 20-25%, severe LVH (speckled myocardium), severely decreased RV systolic function. He also has persistent atrial fibrillation (most of the time in atrial fibrillation though NSR noted on at least 1 occasion).    He has been struggling recently with volume overload and low blood pressure.  He had to stop losartan due to hypotension.  He is short of breath walking 50-100 feet.  He cannot carry 5 lbs without becoming short of breath.  He has orthopnea.  No lightheaded, no chest pain. Legs are very swollen.   He saw Dr. Caryl Comes and was supposed to start torsemide 40 mg bid and stop Lasix 80 mg bid.  However, he is taking both.  He says that he has been urinating "a lot."  K is low today though creatinine is fairly stable at 1.87. Finally right arm (pacemaker side) has been swollen for months. I had him get a venous US today of the RUE, this was negative for DVT.   St Jude device interrogation: >99% BiV pacing.  Thoracic impedance not measured.   ECG: v-paced, probably underlying atrial fibrillation.   Labs (11/20): K 3.6, creatinine 1.77 Labs (12/20): K 2.5, creatinine 1.87  PMH: 1. Complete heart block: 2010, ??Lyme carditis. Received PPM.  2. Gout  3. Atrial fibrillation: Paroxysmal.  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  5. Chronic  systolic CHF: Nonischemic cardiomyopathy.  Initially, this was thought to be due to chronic RV pacing.  - Cath in 4/14: EF 35%, no significant CAD.  - St Jude CRT-P upgrade in 6/18 (he declined ICD).  -  Echo (11/20): EF 20-25%, severe LVH with speckled myocardium, mildly dilated RV with severely decreased systolic function, PASP 41 mmHg.  6. H/o melanoma  Social History   Socioeconomic History  . Marital status: Married    Spouse name: Not on file  . Number of children: 2  . Years of education: Not on file  . Highest education level: Not on file  Occupational History  . Not on file  Tobacco Use  . Smoking status: Former Research scientist (life sciences)  . Smokeless tobacco: Never Used  Substance and Sexual Activity  . Alcohol use: Yes  . Drug use: Never  . Sexual activity: Not Currently  Other Topics Concern  . Not on file  Social History Narrative  . Not on file   Social Determinants of Health   Financial Resource Strain:   . Difficulty of Paying Living Expenses: Not on file  Food Insecurity:   . Worried About Charity fundraiser in the Last Year: Not on file  . Ran Out of Food in the Last Year: Not on file  Transportation Needs:   . Lack of Transportation (Medical): Not on file  . Lack of Transportation (Non-Medical): Not on file  Physical Activity:   .  Days of Exercise per Week: Not on file  . Minutes of Exercise per Session: Not on file  Stress:   . Feeling of Stress : Not on file  Social Connections:   . Frequency of Communication with Friends and Family: Not on file  . Frequency of Social Gatherings with Friends and Family: Not on file  . Attends Religious Services: Not on file  . Active Member of Clubs or Organizations: Not on file  . Attends Archivist Meetings: Not on file  . Marital Status: Not on file  Intimate Partner Violence:   . Fear of Current or Ex-Partner: Not on file  . Emotionally Abused: Not on file  . Physically Abused: Not on file  . Sexually Abused: Not  on file   Family history: Grandfather with MI.  No other cardiac disease that he is aware of.   ROS: All systems reviewed and negative except as per HPI.   Current Outpatient Medications  Medication Sig Dispense Refill  . acetaminophen (TYLENOL) 500 MG tablet Take 1,000 mg by mouth every 8 (eight) hours.    Marland Kitchen albuterol (VENTOLIN HFA) 108 (90 Base) MCG/ACT inhaler Inhale 2 puffs into the lungs every 6 (six) hours as needed for wheezing or shortness of breath. 54 g 3  . allopurinol (ZYLOPRIM) 100 MG tablet Take 100 mg by mouth 2 (two) times daily.    . carvedilol (COREG) 3.125 MG tablet Take 1 tablet (3.125 mg total) by mouth 2 (two) times daily. 180 tablet 3  . finasteride (PROSCAR) 5 MG tablet Take 5 mg by mouth every morning.     Marland Kitchen OVER THE COUNTER MEDICATION Apply 1 application topically 3 (three) times daily as needed (pain). CBD Cream    . OVER THE COUNTER MEDICATION Place 1 drop into both eyes 2 (two) times daily as needed (itching/irritation). Similasin allergy eye relief    . torsemide (DEMADEX) 20 MG tablet Take 4 tablets (80 mg total) by mouth every morning AND 2 tablets (40 mg total) every evening. 180 tablet 3  . traMADol (ULTRAM) 50 MG tablet Take 1 tablet (50 mg total) by mouth every 12 (twelve) hours as needed for up to 10 days. 20 tablet 0  . warfarin (COUMADIN) 2.5 MG tablet Take 1.25-2.5 mg by mouth See admin instructions. Take one tablet (2.5 mg) by mouth on Sunday, Tuesday, Thursday at 7pm, take 1/2 tablet (1.25 mg) on Monday, Wednesday, Friday, Saturday at 7pm    . [START ON 10/15/2019] apixaban (ELIQUIS) 5 MG TABS tablet Take 1 tablet (5 mg total) by mouth 2 (two) times daily. 60 tablet 6  . potassium chloride SA (KLOR-CON) 20 MEQ tablet Take 4 tablets (80 mEq total) by mouth daily for 1 day, THEN 2 tablets (40 mEq total) daily. 64 tablet 6   No current facility-administered medications for this encounter.   BP (!) 105/58   Pulse 70   Wt 80.1 kg (176 lb 9.6 oz)   SpO2  99%   BMI 26.08 kg/m  General: NAD Neck: JVP 10-12 cm, no thyromegaly or thyroid nodule.  Lungs: Clear to auscultation bilaterally with normal respiratory effort. CV: Nondisplaced PMI.  Heart regular S1/S2, no S3/S4, 2/6 HSM LLSB.  2+ edema to thighs.  No carotid bruit.  Difficult to palpate pedal pulses with swelling.  Abdomen: Soft, nontender, no hepatosplenomegaly, no distention.  Skin: Intact without lesions or rashes.  Neurologic: Alert and oriented x 3.  Psych: Normal affect. Extremities: No clubbing or cyanosis.  HEENT:  Normal.   Assessment/Plan: 1. Chronic systolic CHF: Nonischemic based on LHC from 2014 in [place].  EF has been low since that time.  Initially thought to be due to RV pacing, but EF did not improve significantly after St Jude CRT-P upgrade in 6/18 (declined ICD).  Echo in 11/20 with EF 20-25%, severe LVH, severely decreased RV systolic function.  He has struggled with volume overload.  Today, he is significantly volume overloaded on exam despite taking BOTH Lasix 80 mg bid and torsemide 40 mg bid.  NYHA class III symptoms.  - Stop Lasix, increase torsemide to 80 mg qam/40 mg qpm.  - Take 80 mEq total KCl today and start KCl 40 mEq daily tomorrow. BMET Friday.  - Continue Coreg 3.125 mg bid.  - Losartan recently stopped due to elevated creatinine and low BP.  Would avoid ARB/ACEI/ARNI for now with CKD.  - If creatinine remains stable, may be able to start spironolactone in the future.  - Marked volume overload so no beta blocker at this point.  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  I will arrange for PYP scan and will send myeloma panel and UFE.   - I think that he needs RHC, will set this up.  He will make transition from warfarin to Eliquis at this time (stop warfarin prior to cath). We discussed risks/benefits and he agrees to procedure. He may need admission for aggressive diuresis afterwards.  - needs to follow low Na and low fluid  diet.  2. Persistent atrial fibrillation: Most of the time, it appears he is in atrial fibrillation.  Paced today with suspected underlying atrial fibrillation.   - Will plan to transition from warfarin to Eliquis 5 mg bid at time of cath.  3. Complete heart block: Occurred in [place], not related to MI.  ?Lyme carditis (no record of whether this was tested or not, he is not sure).  Lyme carditis could also explain the persistently low EF.  4. CKD: Stage 3.  Follow creatinine closely with diuresis.  5. Right arm swelling: Checked today, negative for DVT by doppler US.   Loralie Champagne 10/01/2019

## 2019-10-02 ENCOUNTER — Telehealth: Payer: Self-pay | Admitting: Family Medicine

## 2019-10-02 ENCOUNTER — Telehealth: Payer: Self-pay | Admitting: Pharmacist

## 2019-10-02 NOTE — Telephone Encounter (Signed)
Message Routed to PCP CMA 

## 2019-10-02 NOTE — Telephone Encounter (Signed)
Home Health Verbal Orders - Caller/Agency:Pete with [facility]  Callback Number: [phone] Requesting OT/PT/Skilled Nursing/Social Work/Speech Therapy: pt is requesting to be discharged , pt feels overwhelmed with all the appt he has and does not what this service at this time

## 2019-10-02 NOTE — Telephone Encounter (Signed)
Patient is scheduled to see pharmD on 12/21 to discuss DOAC, however patient was seen by Dr. Aundra Dubin yesterday and given instructions on how to change to Eliquis after heart cath. Called patient to see if he had any questions and see if he would like to keep or cancel appointment for Monday.

## 2019-10-03 ENCOUNTER — Telehealth: Payer: Self-pay | Admitting: Pharmacist

## 2019-10-03 ENCOUNTER — Other Ambulatory Visit: Payer: Self-pay

## 2019-10-03 ENCOUNTER — Ambulatory Visit (INDEPENDENT_AMBULATORY_CARE_PROVIDER_SITE_OTHER): Payer: Medicare Other | Admitting: Family Medicine

## 2019-10-03 ENCOUNTER — Encounter: Payer: Self-pay | Admitting: Family Medicine

## 2019-10-03 VITALS — BP 110/70 | HR 81 | Temp 95.5°F | Resp 16 | Ht 69.0 in | Wt 172.0 lb

## 2019-10-03 DIAGNOSIS — G4700 Insomnia, unspecified: Secondary | ICD-10-CM | POA: Diagnosis not present

## 2019-10-03 DIAGNOSIS — M1A9XX1 Chronic gout, unspecified, with tophus (tophi): Secondary | ICD-10-CM

## 2019-10-03 DIAGNOSIS — N289 Disorder of kidney and ureter, unspecified: Secondary | ICD-10-CM

## 2019-10-03 DIAGNOSIS — I119 Hypertensive heart disease without heart failure: Secondary | ICD-10-CM

## 2019-10-03 DIAGNOSIS — R748 Abnormal levels of other serum enzymes: Secondary | ICD-10-CM

## 2019-10-03 DIAGNOSIS — E876 Hypokalemia: Secondary | ICD-10-CM

## 2019-10-03 LAB — IMMUNOFIXATION, URINE

## 2019-10-03 MED ORDER — TRAZODONE HCL 50 MG PO TABS: 25.0000 mg | ORAL_TABLET | Freq: Every evening | ORAL | 3 refills | Status: DC | PRN

## 2019-10-03 NOTE — Telephone Encounter (Signed)
Please advise 

## 2019-10-03 NOTE — Patient Instructions (Signed)
A few things to remember from today's visit:   Hypertension with heart disease  Elevated alkaline phosphatase level  Tophi gouty  Insomnia, unspecified type - Plan: traZODone (DESYREL) 50 MG tablet  Trazodone started today for sleep. Tramadol discontinue. Keep wound clean with soap and water. If bleeding apply pressure on wound for 15 min.   Please be sure medication list is accurate. If a new problem present, please set up appointment sooner than planned today.

## 2019-10-03 NOTE — Telephone Encounter (Signed)
Pt returned call to clinic - see 12/17 note for further details.

## 2019-10-03 NOTE — Progress Notes (Signed)
HPI:   [name] is a 73 y.o. male, who is here today for chronic disease management.   Last visit was virtual,telemedicine on 09/24/19 when he was c/o right tender "bump." He is taking Tramadol 50 mg bid as needed and it has helped with pain,able to sleep now. Severe constant pain, exacerbated by palpation. Denies fever,chills,unusual fatigue,or decreased appetite. Gout on Allopurinol 200 mg daily. No joint involvement at this time.  Abnormal LFT's. Negative for abdominal pain,N/V, jaundice,or changes in stool/urine color. Abdominal US in 05/2019 finding consistent with cirrhosis,mild ascites,posible hemangioma.  HFrEF, he is on Coreg 3.125 mg bid and Torsemide 80 mg am and 40 mg pm.  Echo on 09/08/19: LVEF 20-25%.  + Orthopnea, intermittent and stable. S/P pacemaker placement.  States that edema improves with diuretic treatment but gets worse when diuretic doses are decreased. In general he feels better. 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 He states that he has lab appt to re-check K+ level.  Lab Results  Component Value Date   CREATININE 1.87 (H) 10/01/2019   BUN 51 (H) 10/01/2019   NA 139 10/01/2019   K 2.5 (LL) 10/01/2019   CL 96 (L) 10/01/2019   CO2 29 10/01/2019     Lab Results  Component Value Date   ALT 23 09/08/2019   AST 47 (H) 09/08/2019   ALKPHOS 369 (H) 09/08/2019   BILITOT 1.7 (H) 09/08/2019   Lab Results  Component Value Date   PSA 0.25 05/30/2019   He states that he has not seen nephrologist and his renal function is being monitored at his cardiologist's office. Atrial fib,he is not longer on Coumadin. He is on Eliquis 5 mg bid. Negative for CP,palpitation,or SOB.   Today he is requesting medication that helps with sleep. Trouble falling and staying asleep. He has not tried OTC medications.   Review of Systems  Constitutional: Negative for activity change and chills.  HENT: Negative for mouth sores, nosebleeds and sore  throat.   Eyes: Negative for redness and visual disturbance.  Respiratory: Negative for cough and wheezing.   Cardiovascular: Positive for leg swelling.  Genitourinary: Negative for decreased urine volume, dysuria and hematuria.  Musculoskeletal: Positive for arthralgias. Negative for myalgias.  Skin: Negative for pallor and wound.  Neurological: Positive for numbness (upper extremities,pending EMG). Negative for speech difficulty, weakness and headaches.  Psychiatric/Behavioral: Negative for confusion and hallucinations.  Rest of ROS, see pertinent positives sand negatives in HPI   Current Outpatient Medications on File Prior to Visit  Medication Sig Dispense Refill  . acetaminophen (TYLENOL) 650 MG CR tablet Take 650 mg by mouth every 8 (eight) hours.    Marland Kitchen albuterol (VENTOLIN HFA) 108 (90 Base) MCG/ACT inhaler Inhale 2 puffs into the lungs every 6 (six) hours as needed for wheezing or shortness of breath. 54 g 3  . allopurinol (ZYLOPRIM) 100 MG tablet Take 100 mg by mouth 2 (two) times daily.    Derrill Memo ON 10/15/2019] apixaban (ELIQUIS) 5 MG TABS tablet Take 1 tablet (5 mg total) by mouth 2 (two) times daily. 60 tablet 6  . carvedilol (COREG) 3.125 MG tablet Take 1 tablet (3.125 mg total) by mouth 2 (two) times daily. 180 tablet 3  . finasteride (PROSCAR) 5 MG tablet Take 5 mg by mouth every morning.     Marland Kitchen OVER THE COUNTER MEDICATION Apply 1 application topically 3 (three) times daily as needed (pain). CBD Cream    . OVER THE  COUNTER MEDICATION Place 1 drop into both eyes 2 (two) times daily as needed (itching/irritation). Similasin allergy eye relief    . potassium chloride SA (KLOR-CON) 20 MEQ tablet Take 4 tablets (80 mEq total) by mouth daily for 1 day, THEN 2 tablets (40 mEq total) daily. 64 tablet 6  . torsemide (DEMADEX) 20 MG tablet Take 4 tablets (80 mg total) by mouth every morning AND 2 tablets (40 mg total) every evening. 180 tablet 3   No current facility-administered  medications on file prior to visit.     Past Medical History:  Diagnosis Date  . Allergy   . Arthritis   . Asthma   . Cancer ([place])   . Cardiac arrhythmia due to congenital heart disease   . Coronary artery disease   . Heart disease   . Heart disease   . Heart murmur   . Hx: UTI (urinary tract infection)    No Known Allergies  Social History   Socioeconomic History  . Marital status: Married    Spouse name: Not on file  . Number of children: 2  . Years of education: Not on file  . Highest education level: Not on file  Occupational History  . Not on file  Tobacco Use  . Smoking status: Former Research scientist (life sciences)  . Smokeless tobacco: Never Used  Substance and Sexual Activity  . Alcohol use: Yes  . Drug use: Never  . Sexual activity: Not Currently  Other Topics Concern  . Not on file  Social History Narrative  . Not on file   Social Determinants of Health   Financial Resource Strain:   . Difficulty of Paying Living Expenses: Not on file  Food Insecurity:   . Worried About Charity fundraiser in the Last Year: Not on file  . Ran Out of Food in the Last Year: Not on file  Transportation Needs:   . Lack of Transportation (Medical): Not on file  . Lack of Transportation (Non-Medical): Not on file  Physical Activity:   . Days of Exercise per Week: Not on file  . Minutes of Exercise per Session: Not on file  Stress:   . Feeling of Stress : Not on file  Social Connections:   . Frequency of Communication with Friends and Family: Not on file  . Frequency of Social Gatherings with Friends and Family: Not on file  . Attends Religious Services: Not on file  . Active Member of Clubs or Organizations: Not on file  . Attends Archivist Meetings: Not on file  . Marital Status: Not on file    Vitals:   10/03/19 1145  BP: 110/70  Pulse: 81  Resp: 16  Temp: (!) 95.5 F (35.3 C)  SpO2: 99%   Body mass index is 25.4 kg/m.   Physical Exam  Nursing note  reviewed. Constitutional: He is oriented to person, place, and time. He appears well-developed. No distress.  HENT:  Head: Normocephalic and atraumatic.  Mouth/Throat: Oropharynx is clear and moist and mucous membranes are normal.  Eyes: Pupils are equal, round, and reactive to light. Conjunctivae are normal.  Cardiovascular: Normal rate. An irregular rhythm present.  Murmur (RUSB SEM I/VI) heard. Pulses:      Dorsalis pedis pulses are 2+ on the right side and 2+ on the left side.  Respiratory: Effort normal and breath sounds normal. No respiratory distress.  GI: Soft. He exhibits no mass. There is no hepatomegaly. There is no abdominal tenderness.  Musculoskeletal:  General: Edema (2+ pitting edema,bilateral) present.     Comments: No signs of synovitis. Right thumb with erythematous and tender nodular lesion right under fingernail. No active drainage.  Lymphadenopathy:    He has no cervical adenopathy.  Neurological: He is alert and oriented to person, place, and time. He has normal strength. No cranial nerve deficit.  Gait assisted by cane.  Skin: Skin is warm. No rash noted. No erythema.  Psychiatric: He has a normal mood and affect.  Well groomed, good eye contact.    ASSESSMENT AND PLAN:   Mr. BEECHER FURIO was seen today for chronic disease management.  Diagnoses and all orders for this visit:  Tophi gouty Continue Allopurinol 200 mg daily for now. Purine diet to continue. Currently on high diuretic dose. I&D may help with pain. After discussing possible risks and benefits of procedure he would like for me to proceed with I&D of lesion.  After nerve block of right thumb with 1.5 ml of 2 % Lidocaine 4-5 cm a linear incision parallel to finger nail was make. White matter was drain with applying gentile pressure. He tolerated treatment well. Minimal blood loss and hemostasis achieve with pressure on wound for a few minutes. Soaking right thumb in warm water a  couple times per day recommended. Post procedure instructions given.  Insomnia, unspecified type Good sleep hygiene. Sleep can also be affected by diuretic effects.  He agrees with trying Trazodone 25-50 mg 30-45 min before bedtime. F/U in 4 weeks.  -     traZODone (DESYREL) 50 MG tablet; Take 0.5-1 tablets (25-50 mg total) by mouth at bedtime as needed for sleep.  Hypertension with heart disease BP adequately controlled. No changes in current management. Continue low salt diet.  Elevated alkaline phosphatase level Stable otherwise.. ? Hepatic congestion. Liver US to be repeated in 05/2020.  Kidney disease CKD III, e GFR has been stable in the high 30's. Avoid NSAID's. Low salt diet. He is not on ACEI or ARB, because BP on lower normal range I do not recommend adding med for now.  Hypokalemia On K+ supplementation. BMP to be done during next visit with cardiologist, per pt report.   Return in about 4 weeks (around 10/31/2019).    Deloris Moger G. [name], MD  [facility]. [place] office.

## 2019-10-03 NOTE — Telephone Encounter (Signed)
-----   Message from Scarlette Calico, RN sent at 10/01/2019  4:14 PM EST ----- Marykay Lex, Dr Aundra Dubin saw this guy as a new pt today and wanted to change his Coumadin to Eliquis 5 mg BID after his heart cath sch on 12/30.  He is already sch to see you guys on Mon 12/21 to discuss the change per Dr Caryl Comes.  I went ahead and sent in rx for Eliquis 5 mg, if you guys just want to discuss w/him when to stop the Coumadin and he can start the Eliquis 12/30 PM after his cath.  Let me know if you have any questions, thanks so much.

## 2019-10-03 NOTE — Telephone Encounter (Signed)
If pt is requesting to be discharged after understanding benefits of PT/OT,please do so. Thanks, BJ

## 2019-10-03 NOTE — Telephone Encounter (Signed)
I called and spoke with [facility]. He is aware of message below, they have discharged pt but he is aware to call the office if he would like to restart the pt/ot services.

## 2019-10-03 NOTE — Telephone Encounter (Signed)
Will discuss at upcoming appt with pt.

## 2019-10-03 NOTE — Telephone Encounter (Signed)
Pt states he does not have questions about changing to Eliquis. Reviewed twice daily dosing with pt and that he is not to start Eliquis until after his cath on 12/30.   Pt was admitted to hospital on 11/22 for confusion - head CT negative for stroke. Pt takes warfarin for afib with CHADS2VASc score of 3 (age, CHF, CAD). He was advised to hold his Coumadin for 5 days prior to heart cath (last dose on 12/24). He will start Eliquis 5mg  BID after his heart cath on 12/30.   ----- Message from Scarlette Calico, RN sent at 10/01/2019 4:14 PM EST -----  Marykay Lex, Dr Aundra Dubin saw this guy as a new pt today and wanted to change his Coumadin to Eliquis 5 mg BID after his heart cath sch on 12/30. He is already sch to see you guys on Mon 12/21 to discuss the change per Dr Caryl Comes. I went ahead and sent in rx for Eliquis 5 mg, if you guys just want to discuss w/him when to stop the Coumadin and he can start the Eliquis 12/30 PM after his cath. Let me know if you have any questions, thanks so much.

## 2019-10-04 ENCOUNTER — Encounter: Payer: Self-pay | Admitting: Family Medicine

## 2019-10-06 ENCOUNTER — Ambulatory Visit: Payer: Medicare Other

## 2019-10-06 ENCOUNTER — Encounter (HOSPITAL_COMMUNITY): Payer: Medicare Other

## 2019-10-06 ENCOUNTER — Encounter (HOSPITAL_COMMUNITY): Payer: Self-pay

## 2019-10-06 LAB — MULTIPLE MYELOMA PANEL, SERUM
Albumin SerPl Elph-Mcnc: 2.8 g/dL — ABNORMAL LOW (ref 2.9–4.4)
Albumin/Glob SerPl: 1.1 (ref 0.7–1.7)
Alpha 1: 0.4 g/dL (ref 0.0–0.4)
Alpha2 Glob SerPl Elph-Mcnc: 0.8 g/dL (ref 0.4–1.0)
B-Globulin SerPl Elph-Mcnc: 1 g/dL (ref 0.7–1.3)
Gamma Glob SerPl Elph-Mcnc: 0.6 g/dL (ref 0.4–1.8)
Globulin, Total: 2.7 g/dL (ref 2.2–3.9)
IgA: 192 mg/dL (ref 61–437)
IgG (Immunoglobin G), Serum: 569 mg/dL — ABNORMAL LOW (ref 603–1613)
IgM (Immunoglobulin M), Srm: 93 mg/dL (ref 15–143)
Total Protein ELP: 5.5 g/dL — ABNORMAL LOW (ref 6.0–8.5)

## 2019-10-07 ENCOUNTER — Other Ambulatory Visit: Payer: Self-pay

## 2019-10-07 ENCOUNTER — Telehealth (HOSPITAL_COMMUNITY): Payer: Self-pay

## 2019-10-07 DIAGNOSIS — G4700 Insomnia, unspecified: Secondary | ICD-10-CM

## 2019-10-07 MED ORDER — TRAZODONE HCL 50 MG PO TABS: 25.0000 mg | ORAL_TABLET | Freq: Every evening | ORAL | 1 refills | Status: DC | PRN

## 2019-10-07 NOTE — Telephone Encounter (Signed)
See why he cancelled it.

## 2019-10-07 NOTE — Telephone Encounter (Signed)
Received message from office staff stating that patient cancelled PYP test.

## 2019-10-08 ENCOUNTER — Other Ambulatory Visit (HOSPITAL_COMMUNITY): Payer: Self-pay

## 2019-10-08 ENCOUNTER — Telehealth (HOSPITAL_COMMUNITY): Payer: Self-pay | Admitting: Pharmacist

## 2019-10-08 MED ORDER — APIXABAN 5 MG PO TABS: 5.0000 mg | ORAL_TABLET | Freq: Two times a day (BID) | ORAL | 0 refills | Status: DC

## 2019-10-08 MED ORDER — POTASSIUM CHLORIDE CRYS ER 20 MEQ PO TBCR: EXTENDED_RELEASE_TABLET | ORAL | 0 refills | Status: DC

## 2019-10-08 NOTE — Telephone Encounter (Signed)
Patient Advocate Encounter   Received notification from [employer] that prior authorization for Apixaban is required.   PA submitted by fax Status is pending   Will continue to follow.  Audry Riles, PharmD, BCPS, BCCP, CPP Heart Failure Clinic Pharmacist [phone]

## 2019-10-08 NOTE — Telephone Encounter (Signed)
Spoke with patient, pt states he did not cancel test. Pt is ordered for PYP scan.  Cancelled test was amyloid scan from Dr Aquilla Hacker office cancelled by our office staff.   Our scheduler Arbutus Leas to schedule patient for PYP and contact him with date and time. Pt aware of same and appreciative.

## 2019-10-13 ENCOUNTER — Telehealth: Payer: Self-pay

## 2019-10-13 ENCOUNTER — Other Ambulatory Visit (HOSPITAL_COMMUNITY)
Admission: RE | Admit: 2019-10-13 | Discharge: 2019-10-13 | Disposition: A | Discharge status: Home / Self Care | Payer: Medicare Other | Source: Ambulatory Visit | Attending: Cardiology | Admitting: Cardiology

## 2019-10-13 DIAGNOSIS — Z20828 Contact with and (suspected) exposure to other viral communicable diseases: Secondary | ICD-10-CM | POA: Diagnosis not present

## 2019-10-13 DIAGNOSIS — Z01812 Encounter for preprocedural laboratory examination: Secondary | ICD-10-CM | POA: Insufficient documentation

## 2019-10-13 NOTE — Telephone Encounter (Signed)
Advanced Heart Failure Patient Advocate Encounter  Prior Authorization for Eliquis has been approved.    Effective dates: 09/08/19 through 10/07/2020  Audry Riles, PharmD, BCPS, BCCP, CPP Heart Failure Clinic Pharmacist [phone]

## 2019-10-14 ENCOUNTER — Telehealth: Payer: Self-pay

## 2019-10-14 LAB — SARS CORONAVIRUS 2 (TAT 6-24 HRS): SARS Coronavirus 2: NEGATIVE

## 2019-10-14 NOTE — Telephone Encounter (Signed)
Reviewed the following pre-cath instructions with the patient and he had no questions at this time.  Pt contacted pre-catheterization scheduled at [facility] for: 8:30 am Verified arrival time and place: [facility]) at: 6:30 am  No solid food after midnight prior to cath, clear liquids until 5 AM day of procedure. Contrast allergy: No Verified no diabetes medications. None  AM meds can be  taken pre-cath with sip of water including: ASA 81 mg  Patient instructed to hold Demadex and Potassium Chloride medications the morning of the procedure.  Patient is instructed to hold Eliquis for two days before the procedure.  Confirmed patient has responsible adult to drive home post procedure and observe 24 hours after arriving home: Patient has a responsible adult to drive them home after the procedure and observe them for 24 hours after arriving home.  Currently, due to Covid-19 pandemic, only one support person will be allowed with patient. Must be the same support person for that patient's entire stay, will be screened and required to wear a mask. They will be asked to wait in the waiting room for the duration of the patient's stay.  Patients are required to wear a mask when they enter the hospital.      COVID-19 Pre-Screening Questions:  . In the past 7 to 10 days have you had a cough,  shortness of breath, headache, congestion, fever (100 or greater) body aches, chills, sore throat, or sudden loss of taste or sense of smell? No . Have you been around anyone with known Covid 19? No . Have you been around anyone who is awaiting Covid 19 test results in the past 7 to 10 days? No . Have you been around anyone who has been exposed to Covid 19, or has mentioned symptoms of Covid 19 within the past 7 to 10 days? No  If you have any concerns/questions about symptoms patients report during screening (either on the phone or at threshold). Contact the provider  seeing the patient or DOD for further guidance.  If neither are available contact a member of the leadership team.

## 2019-10-14 NOTE — Progress Notes (Deleted)
Cardiology Office Note Date:  10/14/2019  Patient ID:  Charles Mcmahon 07/22/1946, MRN [id number] PCP:  [name], [name], MD  Cardiologist:  Dr. Harrell Gave Electrophysiologist: Dr. Caryl Comes AHF: Dr. Aundra Dubin  ***refresh   Chief Complaint: *** ??  History of Present Illness: Charles Mcmahon is a 73 y.o. 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  He comes in today to be seen for Dr. Caryl Comes.  Last and first seen by him 09/25/2019.  At that time a number of concerns.   --He had RUE swelling, suspect to be 2/2 his device, he was already on warfarin for a/c and imaging not pursued, recommended to elevate his R arm as much as possible when seated. -- markedly volume OL and diuretics adjusted CM hx, ? 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 Aundra Dubin.  He details his past cardiac w/u  --Cath in 4/14 in setting of EF 35% showed no significant coronary disease --12/18, his device was upgraded to [facility] CRT-P (declined ICD), but EF remained low --echo in 11/20 showed EF 20-25%, severe LVH (speckled myocardium), severely decreased RV systolic function --has persistent atrial fibrillation (most of the time in atrial fibrillation though NSR noted on at least 1 occasion).   -- ? 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 *** volume w/AHF *** meds w/AHF   Device information ***  Past Medical History:  Diagnosis Date  . Allergy   . Arthritis   . Asthma   . Cancer ([place])   . Cardiac  arrhythmia due to congenital heart disease   . Coronary artery disease   . Heart disease   . Heart disease   . Heart murmur   . Hx: UTI (urinary tract infection)     Past Surgical History:  Procedure Laterality Date  . CHOLECYSTECTOMY  2008  . HERNIA REPAIR  2019   right side anguler  . MELANOMA EXCISION Right 1993   right arm  . PACEMAKER IMPLANT    . REPLACEMENT TOTAL KNEE Right     Current Outpatient Medications  Medication Sig Dispense Refill  . acetaminophen (TYLENOL) 650 MG CR tablet Take 650 mg by mouth every 8 (eight) hours.    Marland Kitchen albuterol (VENTOLIN HFA) 108 (90 Base) MCG/ACT inhaler Inhale 2 puffs into the lungs every 6 (six) hours as needed for wheezing or shortness of breath. 54 Mcmahon 3  . allopurinol (ZYLOPRIM) 100 MG tablet Take 100 mg by mouth 2 (two) times daily.    Derrill Memo ON 10/15/2019] apixaban (ELIQUIS) 5 MG TABS tablet Take 1 tablet (5 mg total) by mouth 2 (two) times daily. 180 tablet 0  . carvedilol (COREG) 3.125 MG tablet Take 1 tablet (3.125 mg total) by mouth 2 (two) times daily. 180 tablet 3  . finasteride (PROSCAR) 5 MG tablet Take 5 mg by mouth every morning.     Marland Kitchen OVER THE COUNTER MEDICATION Apply 1 application topically 3 (three) times daily as needed (pain). CBD Cream    .  OVER THE COUNTER MEDICATION Place 1 drop into both eyes 2 (two) times daily as needed (itching/irritation). Similasin allergy eye relief    . potassium chloride SA (KLOR-CON) 20 MEQ tablet Take 4 tablets (80 mEq total) by mouth daily for 1 day, THEN 2 tablets (40 mEq total) daily. 181 tablet 0  . torsemide (DEMADEX) 20 MG tablet Take 4 tablets (80 mg total) by mouth every morning AND 2 tablets (40 mg total) every evening. 180 tablet 3  . traZODone (DESYREL) 50 MG tablet Take 0.5-1 tablets (25-50 mg total) by mouth at bedtime as needed for sleep. 90 tablet 1   No current facility-administered medications for this visit.    Allergies:   Patient has no known allergies.   Social  History:  The patient  reports that he has quit smoking. He has never used smokeless tobacco. He reports current alcohol use. He reports that he does not use drugs.   Family History:  The patient's family history includes Arthritis in his mother.  ROS:  Please see the history of present illness.    All other systems are reviewed and otherwise negative.   PHYSICAL EXAM: *** VS:  There were no vitals taken for this visit. BMI: There is no height or weight on file to calculate BMI. Well nourished, well developed, in no acute distress  HEENT: normocephalic, atraumatic  Neck: no JVD, carotid bruits or masses Cardiac:  *** RRR; no significant murmurs, no rubs, or gallops Lungs:  *** CTA b/l, no wheezing, rhonchi or rales  Abd: soft, nontender MS: no deformity or *** atrophy Ext: ***  no edema *** RUE *** Skin: warm and dry, no rash Neuro:  No gross deficits appreciated Psych: euthymic mood, full affect  *** PPM (R side) site is stable, no tethering or discomfort   EKG:  Not done today  PPM interrogation done today and reviewed by myself: ***  10/01/2019: Right UE venous doppler: No evidence of deep vein thrombosis in the upper extremity. No evidence of superficial vein thrombosis in the upper extremity  09/08/2019: TTE IMPRESSIONS 1. LV hypertrophy with appearance of myocardium concerning for infiltrative process such as amyloidosis. Consider PYP if clinical suspicion for amyloid is high.  2. Biventricular severe dysfunction.  3. Left ventricular ejection fraction, by visual estimation, is 20 to 25%. The left ventricle has severely decreased function. There is severely increased left ventricular hypertrophy.  4. Elevated left ventricular end-diastolic pressure.  5. Left ventricular diastolic parameters are indeterminate.  6. The left ventricle demonstrates global hypokinesis.  7. Global right ventricle has severely reduced systolic function.The right ventricular size is mildly  enlarged. Moderately increased right ventricular wall thickness.  8. Left atrial size was mildly dilated.  9. Right atrial size was moderately dilated. 10. Small pericardial effusion. 11. The mitral valve is abnormal. Mild mitral valve regurgitation. 12. The tricuspid valve is grossly normal. Tricuspid valve regurgitation moderate. 13. The aortic valve is tricuspid. Aortic valve regurgitation is not visualized. No evidence of aortic valve sclerosis or stenosis. 14. The pulmonic valve was grossly normal. Pulmonic valve regurgitation is not visualized. 15. Moderately elevated pulmonary artery systolic pressure. 16. A pacer wire is visualized in the RA and RV. 17. The inferior vena cava is dilated in size with <50% respiratory variability, suggesting right atrial pressure of 15 mmHg. 18. The tricuspid regurgitant velocity is 2.53 m/s, and with an assumed right atrial pressure of 15 mmHg, the estimated right ventricular systolic pressure is moderately elevated at 40.5 mmHg.  Recent Labs: 06/17/2019: Pro B Natriuretic peptide (BNP) 661.0 09/08/2019: ALT 23; Magnesium 2.1 09/09/2019: TSH 7.194 10/01/2019: BUN 51; Creatinine, Ser 1.87; Hemoglobin 12.7; Platelets 242; Potassium 2.5; Sodium 139  09/08/2019: Cholesterol 182; HDL 60; LDL Cholesterol 98; Total CHOL/HDL Ratio 3.0; Triglycerides 120; VLDL 24   Estimated Creatinine Clearance: 34.7 mL/min (A) (by C-Mcmahon formula based on SCr of 1.87 mg/dL (H)).   Wt Readings from Last 3 Encounters:  10/03/19 172 lb (78 kg)  10/01/19 176 lb 9.6 oz (80.1 kg)  09/25/19 180 lb (81.6 kg)     Other studies reviewed: Additional studies/records reviewed today include: summarized above  ASSESSMENT AND PLAN:  1. CRT-P     ***  2. NICM 3. Chronic CHF (systolic)     ***  Disposition: F/u with ***  Current medicines are reviewed at length with the patient today.  The patient did not have any concerns regarding medicines.***  Signed, Tommye Standard,  PA-C 10/14/2019 2:06 PM     [facility] [address] [place] [address] [phone] (office)  [phone] (fax)

## 2019-10-15 ENCOUNTER — Other Ambulatory Visit (HOSPITAL_COMMUNITY): Payer: Medicare Other

## 2019-10-15 ENCOUNTER — Ambulatory Visit (HOSPITAL_COMMUNITY)
Admission: RE | Admit: 2019-10-15 | Discharge: 2019-10-15 | Disposition: A | Discharge status: Home / Self Care | Payer: Medicare Other | Attending: Cardiology | Admitting: Cardiology

## 2019-10-15 ENCOUNTER — Encounter (HOSPITAL_COMMUNITY)
Admission: RE | Disposition: A | Discharge status: Home / Self Care | Payer: Medicare Other | Source: Home / Self Care | Attending: Cardiology

## 2019-10-15 ENCOUNTER — Telehealth (HOSPITAL_COMMUNITY): Payer: Self-pay

## 2019-10-15 ENCOUNTER — Other Ambulatory Visit: Payer: Self-pay

## 2019-10-15 DIAGNOSIS — I4819 Other persistent atrial fibrillation: Secondary | ICD-10-CM | POA: Insufficient documentation

## 2019-10-15 DIAGNOSIS — I442 Atrioventricular block, complete: Secondary | ICD-10-CM | POA: Diagnosis not present

## 2019-10-15 DIAGNOSIS — Z85820 Personal history of malignant melanoma of skin: Secondary | ICD-10-CM | POA: Insufficient documentation

## 2019-10-15 DIAGNOSIS — Z87891 Personal history of nicotine dependence: Secondary | ICD-10-CM | POA: Diagnosis not present

## 2019-10-15 DIAGNOSIS — N183 Chronic kidney disease, stage 3 unspecified: Secondary | ICD-10-CM | POA: Insufficient documentation

## 2019-10-15 DIAGNOSIS — I509 Heart failure, unspecified: Secondary | ICD-10-CM

## 2019-10-15 DIAGNOSIS — Z7901 Long term (current) use of anticoagulants: Secondary | ICD-10-CM | POA: Insufficient documentation

## 2019-10-15 DIAGNOSIS — Z950 Presence of cardiac pacemaker: Secondary | ICD-10-CM | POA: Insufficient documentation

## 2019-10-15 DIAGNOSIS — I428 Other cardiomyopathies: Secondary | ICD-10-CM | POA: Diagnosis not present

## 2019-10-15 DIAGNOSIS — I5022 Chronic systolic (congestive) heart failure: Secondary | ICD-10-CM | POA: Insufficient documentation

## 2019-10-15 DIAGNOSIS — M109 Gout, unspecified: Secondary | ICD-10-CM | POA: Insufficient documentation

## 2019-10-15 DIAGNOSIS — I272 Pulmonary hypertension, unspecified: Secondary | ICD-10-CM | POA: Diagnosis not present

## 2019-10-15 HISTORY — PX: RIGHT HEART CATH: CATH118263

## 2019-10-15 LAB — POCT I-STAT EG7
Acid-Base Excess: 3 mmol/L — ABNORMAL HIGH (ref 0.0–2.0)
Acid-Base Excess: 4 mmol/L — ABNORMAL HIGH (ref 0.0–2.0)
Bicarbonate: 26.9 mmol/L (ref 20.0–28.0)
Bicarbonate: 27.2 mmol/L (ref 20.0–28.0)
Calcium, Ion: 1.17 mmol/L (ref 1.15–1.40)
Calcium, Ion: 1.18 mmol/L (ref 1.15–1.40)
HCT: 33 % — ABNORMAL LOW (ref 39.0–52.0)
HCT: 33 % — ABNORMAL LOW (ref 39.0–52.0)
Hemoglobin: 11.2 g/dL — ABNORMAL LOW (ref 13.0–17.0)
Hemoglobin: 11.2 g/dL — ABNORMAL LOW (ref 13.0–17.0)
O2 Saturation: 62 %
O2 Saturation: 63 %
Potassium: 3.5 mmol/L (ref 3.5–5.1)
Potassium: 3.5 mmol/L (ref 3.5–5.1)
Sodium: 137 mmol/L (ref 135–145)
Sodium: 137 mmol/L (ref 135–145)
TCO2: 28 mmol/L (ref 22–32)
TCO2: 28 mmol/L (ref 22–32)
pCO2, Ven: 36.4 mmHg — ABNORMAL LOW (ref 44.0–60.0)
pCO2, Ven: 36.9 mmHg — ABNORMAL LOW (ref 44.0–60.0)
pH, Ven: 7.476 — ABNORMAL HIGH (ref 7.250–7.430)
pH, Ven: 7.476 — ABNORMAL HIGH (ref 7.250–7.430)
pO2, Ven: 30 mmHg — CL (ref 32.0–45.0)
pO2, Ven: 30 mmHg — CL (ref 32.0–45.0)

## 2019-10-15 LAB — BASIC METABOLIC PANEL
Anion gap: 13 (ref 5–15)
BUN: 50 mg/dL — ABNORMAL HIGH (ref 8–23)
CO2: 24 mmol/L (ref 22–32)
Calcium: 8.9 mg/dL (ref 8.9–10.3)
Chloride: 100 mmol/L (ref 98–111)
Creatinine, Ser: 1.67 mg/dL — ABNORMAL HIGH (ref 0.61–1.24)
GFR calc Af Amer: 46 mL/min — ABNORMAL LOW (ref >60)
GFR calc non Af Amer: 40 mL/min — ABNORMAL LOW (ref >60)
Glucose, Bld: 111 mg/dL — ABNORMAL HIGH (ref 70–99)
Potassium: 3.5 mmol/L (ref 3.5–5.1)
Sodium: 137 mmol/L (ref 135–145)

## 2019-10-15 SURGERY — RIGHT HEART CATH
Anesthesia: LOCAL

## 2019-10-15 MED ORDER — MIDAZOLAM HCL 2 MG/2ML IJ SOLN
INTRAMUSCULAR | Status: DC | PRN
  Administered 2019-10-15: 1 mg via INTRAVENOUS

## 2019-10-15 MED ORDER — METOLAZONE 2.5 MG PO TABS: 2.5000 mg | ORAL_TABLET | ORAL | 0 refills | Status: DC

## 2019-10-15 MED ORDER — TORSEMIDE 20 MG PO TABS: 80.0000 mg | ORAL_TABLET | Freq: Two times a day (BID) | ORAL | 3 refills | Status: DC

## 2019-10-15 MED ORDER — SODIUM CHLORIDE 0.9 % IV SOLN: INTRAVENOUS | Status: DC

## 2019-10-15 MED ORDER — ASPIRIN 81 MG PO CHEW: 81.0000 mg | CHEWABLE_TABLET | ORAL | Status: DC

## 2019-10-15 MED ORDER — HEPARIN (PORCINE) IN NACL 1000-0.9 UT/500ML-% IV SOLN
INTRAVENOUS | Status: DC | PRN
  Administered 2019-10-15: 500 mL

## 2019-10-15 MED ORDER — LIDOCAINE HCL (PF) 1 % IJ SOLN
INTRAMUSCULAR | Status: DC | PRN
  Administered 2019-10-15: 2 mL via INTRADERMAL

## 2019-10-15 MED ORDER — LIDOCAINE HCL (PF) 1 % IJ SOLN
INTRAMUSCULAR | Status: AC
  Filled 2019-10-15: qty 30

## 2019-10-15 MED ORDER — SODIUM CHLORIDE 0.9 % IV SOLN: 250.0000 mL | INTRAVENOUS | Status: DC | PRN

## 2019-10-15 MED ORDER — POTASSIUM CHLORIDE CRYS ER 20 MEQ PO TBCR: 60.0000 meq | EXTENDED_RELEASE_TABLET | Freq: Every day | ORAL | 0 refills | Status: DC

## 2019-10-15 MED ORDER — HEPARIN (PORCINE) IN NACL 1000-0.9 UT/500ML-% IV SOLN
INTRAVENOUS | Status: AC
  Filled 2019-10-15: qty 500

## 2019-10-15 MED ORDER — MIDAZOLAM HCL 2 MG/2ML IJ SOLN
INTRAMUSCULAR | Status: AC
  Filled 2019-10-15: qty 2

## 2019-10-15 MED ORDER — SODIUM CHLORIDE 0.9% FLUSH: 3.0000 mL | INTRAVENOUS | Status: DC | PRN

## 2019-10-15 SURGICAL SUPPLY — 6 items
CATH BALLN WEDGE 5F 110CM (CATHETERS) ×2 IMPLANT
PACK CARDIAC CATHETERIZATION (CUSTOM PROCEDURE TRAY) ×2 IMPLANT
SHEATH GLIDE SLENDER 4/5FR (SHEATH) ×2 IMPLANT
TRANSDUCER W/STOPCOCK (MISCELLANEOUS) ×2 IMPLANT
TUBING ART PRESS 72  MALE/FEM (TUBING) ×1
TUBING ART PRESS 72 MALE/FEM (TUBING) ×1 IMPLANT

## 2019-10-15 NOTE — Interval H&P Note (Signed)
History and Physical Interval Note:  10/15/2019 9:11 AM  Charles Mcmahon  has presented today for surgery, with the diagnosis of heart failure.  The various methods of treatment have been discussed with the patient and family. After consideration of risks, benefits and other options for treatment, the patient has consented to  Procedure(s): RIGHT HEART CATH (N/A) as a surgical intervention.  The patient's history has been reviewed, patient examined, no change in status, stable for surgery.  I have reviewed the patient's chart and labs.  Questions were answered to the patient's satisfaction.     Adolph Clutter [employer]

## 2019-10-15 NOTE — Discharge Instructions (Signed)
    Right Heart Catheterization, Care After This sheet gives you information about how to care for yourself after your procedure. Your health care provider may also give you more specific instructions. If you have problems or questions, contact your health care provider. What can I expect after the procedure? After the procedure, it is common to have:  Bruising or mild discomfort in the area where the IV was inserted (insertion site). Follow these instructions at home: Eating and drinking   Follow instructions from your health care provider about eating or drinking restrictions.  Drink a lot of fluids for the first several days after the procedure, as directed by your health care provider. This helps to wash (flush) the contrast out of your body. Examples of healthy fluids include water or low-calorie drinks. General instructions  Check your IV insertion area every day for signs of infection. Check for: ? Redness, swelling, or pain. ? Fluid or blood. ? Warmth. ? Pus or a bad smell.  Take over-the-counter and prescription medicines only as told by your health care provider.  Rest and return to your normal activities as told by your health care provider. Ask your health care provider what activities are safe for you.  Do not drive for 24 hours if you were given a medicine to help you relax (sedative), or until your health care provider approves.  Keep all follow-up visits as told by your health care provider. This is important. Contact a health care provider if:  Your skin becomes itchy or you develop a rash or hives.  You have a fever that does not get better with medicine.  You feel nauseous.  You vomit.  You have redness, swelling, or pain around the insertion site.  You have fluid or blood coming from the insertion site.  Your insertion area feels warm to the touch.  You have pus or a bad smell coming from the insertion site. Get help right away if:  You have  difficulty breathing or shortness of breath.  You develop chest pain.  You faint.  You feel very dizzy. These symptoms may represent a serious problem that is an emergency. Do not wait to see if the symptoms will go away. Get medical help right away. Call your local emergency services (911 in the [place] Do not drive yourself to the hospital. Summary  After your procedure, it is common to have bruising or mild discomfort in the area where the IV was inserted.  You should check your IV insertion area every day for signs of infection.  Take over-the-counter and prescription medicines only as told by your health care provider.  You should drink a lot of fluids for the first several days after the procedure to help flush the contrast from your body.  This information is not intended to replace advice given to you by your health care provider. Make sure you discuss any questions you have with your health care provider. Document Released: 07/23/2013 Document Revised: 09/14/2017 Document Reviewed: 08/26/2016 Elsevier Patient Education  [place].   Medication instructions: 1. Increase torsemide to 80 mg bid.  2. Metolazone 2.5 mg once weekly on Thursdays (take with morning torsemide).  3. Increase KCl to 60 mEq daily. 4. Take an extra KCl 20 on metolazone days (total of 80 mEq) 5. Start apixaban 5 mg twice a day this evening.  6. Do NOT restart warfarin.

## 2019-10-15 NOTE — Telephone Encounter (Signed)
Called patient to review medication instructions post cath. Medication changes reviewed. Pt advised per dr Aundra Dubin to take an extra potassium 20 meq on thursdays when he take metolazone.  Pt states that was not on paper work. Pt verbalized understanding. Pt has f/u next week with dr Aundra Dubin. No questions endorsed

## 2019-10-15 NOTE — Progress Notes (Signed)
Discharge instructions reviewed with patient and family. Verbalized understanding. 

## 2019-10-16 ENCOUNTER — Encounter: Payer: Medicare Other | Admitting: Physician Assistant

## 2019-10-20 ENCOUNTER — Other Ambulatory Visit: Payer: Self-pay | Admitting: Internal Medicine

## 2019-10-20 MED ORDER — CARVEDILOL 3.125 MG PO TABS: 3.1250 mg | ORAL_TABLET | Freq: Two times a day (BID) | ORAL | 3 refills | Status: DC

## 2019-10-20 NOTE — Telephone Encounter (Signed)
 *  STAT* If patient is at the pharmacy, call can be transferred to refill team.   1. Which medications need to be refilled? (please list name of each medication and dose if known) carvedilol (COREG) 3.125 MG tablet  2. Which pharmacy/location (including street and city if local pharmacy) is medication to be sent to? [id number] - [place], [place] - [address]  3. Do they need a 30 day or 90 day supply? Patient is requesting an emergency 10 day script. He states his prescription won't be in and he will run out before it comes.

## 2019-10-21 ENCOUNTER — Encounter (HOSPITAL_COMMUNITY): Payer: Medicare Other

## 2019-10-22 ENCOUNTER — Encounter: Payer: Self-pay | Admitting: Adult Health

## 2019-10-22 ENCOUNTER — Ambulatory Visit (INDEPENDENT_AMBULATORY_CARE_PROVIDER_SITE_OTHER): Payer: Medicare Other | Admitting: Adult Health

## 2019-10-22 ENCOUNTER — Other Ambulatory Visit: Payer: Self-pay

## 2019-10-22 VITALS — BP 88/67 | HR 116 | Temp 97.3°F | Ht 69.0 in | Wt 178.9 lb

## 2019-10-22 DIAGNOSIS — E785 Hyperlipidemia, unspecified: Secondary | ICD-10-CM

## 2019-10-22 DIAGNOSIS — I4821 Permanent atrial fibrillation: Secondary | ICD-10-CM | POA: Diagnosis not present

## 2019-10-22 DIAGNOSIS — G459 Transient cerebral ischemic attack, unspecified: Secondary | ICD-10-CM | POA: Diagnosis not present

## 2019-10-22 NOTE — Patient Instructions (Signed)
Continue Eliquis (apixaban) daily for secondary stroke prevention  Continue to follow with cardiology as scheduled  Continue to monitor blood pressure at home  As you have recovered well from a stroke standpoint, I do not see any concerning findings or reasons for you to not return to driving.   Graduated return to driving as recommended.  It is recommended that you first drive with another licensed driver in an empty parking lot. If you do well with this, you can drive on a quiet street with the licensed driver.  If you do well with this, you can drive on a busy street with a licensed driver.  If you continue to do well, you can be cleared to drive independently.  For the first month after resuming driving, it is recommend no nighttime, busy/heavy traffic roads or Interstate driving.   Maintain strict control of hypertension with blood pressure goal below 130/90, diabetes with hemoglobin A1c goal below 6.5% and cholesterol with LDL cholesterol (bad cholesterol) goal below 70 mg/dL. I also advised the patient to eat a healthy diet with plenty of whole grains, cereals, fruits and vegetables, exercise regularly and maintain ideal body weight.  Followup in the future with me in 6 months or call earlier if needed       Thank you for coming to see [place] at [facility]. I hope we have been able to provide you high quality care today.  You may receive a patient satisfaction survey over the next few weeks. We would appreciate your feedback and comments so that we may continue to improve ourselves and the health of our patients.

## 2019-10-22 NOTE — Progress Notes (Signed)
[facility] [address] [place]. [place] [address] [phone]       HOSPITAL FOLLOW UP NOTE  [name] Date of Birth:  [birth year] Medical Record Number:  [id number]   Reason for Referral:  hospital stroke follow up    CHIEF COMPLAINT:  Chief Complaint  Patient presents with  . Follow-up    RM9. alone. Doing well. No concerns.    HPI: Charles Mcmahon being seen today for in office hospital follow-up regarding possible stroke versus TIA on 09/07/2019.  History obtained from patient and chart review. Reviewed all radiology images and labs personally.  [name] is a 74 y.o. male with history of chronic atrial fibrillation on Coumadin,possible sleep apnea, restless leg,history of UTIs in the past,CKD,coronary artery disease,pacemaker placement for heart failure,who presented on 09/07/2019 with changes in mentation and generalized weakness.  Evaluated by Dr. Leonie Man and stroke team with stroke work-up revealing possible stroke versus TIA with possible underlying mild cognitive impairment.  Unable to obtain MRI due to pacer.  CT head did not show any acute abnormality.  Unable to obtain CTA head/neck due to CKD and unable to obtain MRI due to pacer.  Carotid Doppler unremarkable bilaterally.  2D echo showed diminished left ventricular ejection fraction 20 to 25% with left ventricular hypertrophy with appearance of myocardium concerning for infiltrative process like amyloidosis.  EEG normal.  Previously on warfarin for atrial fibrillation and recommended continuation at discharge as he is not a DOAC candidate due to CKD.  LDL 98.  No prior history of evidence of DM with A1c 5.9.  Blood pressure stable on the low side without prior history of HTN.  Also obtain during admission was dementia panel which was pending at discharge.  Other stroke risk factors include advanced age, former tobacco use, EtOH use, CAD, nonischemic cardiomyopathy on Lasix and CHB  s/p pacer.  Other active problems include CKD stage IV, hyperkalemia, severe protein calorie malnutrition and elevated alkaline phosphatase.  He was discharged home in stable condition.  Charles Mcmahon is a 74 year old male who is being seen today for hospital follow-up.  He has been doing very well from a stroke standpoint without residual deficits or reoccurring symptoms.  He has not had any additional confusion or memory concerns.  MMSE today 30/30.  He continues to live with his wife and is questioning return to driving.  Dementia panel did show elevated TSH but normal vitamin B12 and RPR. He did have follow-up with cardiology who recommended initiating Eliquis and discontinuing warfarin.  Also concern for cardiac amyloidosis and currently undergoing evaluation.  He also underwent cardiac cath on 10/15/2019 due to ongoing CHF with torsemide dosage increase.  He has continued on Eliquis without bleeding or bruising.  Blood pressure today 88/67 which has been is normal at home.  He is asymptomatic and does have follow-up with cardiology in 2 days.  Denies new or worsening stroke/TIA symptoms.    ROS:   14 system review of systems performed and negative with exception of swelling  PMH:  Past Medical History:  Diagnosis Date  . Allergy   . Arthritis   . Asthma   . Cancer ([facility])   . Cardiac arrhythmia due to congenital heart disease   . Coronary artery disease   . Heart disease   . Heart disease   . Heart murmur   . Hx: UTI (urinary tract infection)     PSH:  Past Surgical History:  Procedure Laterality Date  .  CHOLECYSTECTOMY  2008  . HERNIA REPAIR  2019   right side anguler  . MELANOMA EXCISION Right 1993   right arm  . PACEMAKER IMPLANT    . REPLACEMENT TOTAL KNEE Right   . RIGHT HEART CATH N/A 10/15/2019   Procedure: RIGHT HEART CATH;  Surgeon: Larey Dresser, MD;  Location: [facility];  Service: Cardiovascular;  Laterality: N/A;    Social History:  Social History    Socioeconomic History  . Marital status: Married    Spouse name: Not on file  . Number of children: 2  . Years of education: Not on file  . Highest education level: Not on file  Occupational History  . Not on file  Tobacco Use  . Smoking status: Former Research scientist (life sciences)  . Smokeless tobacco: Never Used  Substance and Sexual Activity  . Alcohol use: Yes  . Drug use: Never  . Sexual activity: Not Currently  Other Topics Concern  . Not on file  Social History Narrative  . Not on file   Social Determinants of Health   Financial Resource Strain:   . Difficulty of Paying Living Expenses: Not on file  Food Insecurity:   . Worried About Charity fundraiser in the Last Year: Not on file  . Ran Out of Food in the Last Year: Not on file  Transportation Needs:   . Lack of Transportation (Medical): Not on file  . Lack of Transportation (Non-Medical): Not on file  Physical Activity:   . Days of Exercise per Week: Not on file  . Minutes of Exercise per Session: Not on file  Stress:   . Feeling of Stress : Not on file  Social Connections:   . Frequency of Communication with Friends and Family: Not on file  . Frequency of Social Gatherings with Friends and Family: Not on file  . Attends Religious Services: Not on file  . Active Member of Clubs or Organizations: Not on file  . Attends Archivist Meetings: Not on file  . Marital Status: Not on file  Intimate Partner Violence:   . Fear of Current or Ex-Partner: Not on file  . Emotionally Abused: Not on file  . Physically Abused: Not on file  . Sexually Abused: Not on file    Family History:  Family History  Problem Relation Age of Onset  . Arthritis Mother     Medications:   Current Outpatient Medications on File Prior to Visit  Medication Sig Dispense Refill  . acetaminophen (TYLENOL) 650 MG CR tablet Take 650 mg by mouth every 8 (eight) hours.    Marland Kitchen albuterol (VENTOLIN HFA) 108 (90 Base) MCG/ACT inhaler Inhale 2 puffs into  the lungs every 6 (six) hours as needed for wheezing or shortness of breath. 54 g 3  . allopurinol (ZYLOPRIM) 100 MG tablet Take 100 mg by mouth 2 (two) times daily.    Marland Kitchen apixaban (ELIQUIS) 5 MG TABS tablet Take 1 tablet (5 mg total) by mouth 2 (two) times daily. 180 tablet 0  . carvedilol (COREG) 3.125 MG tablet Take 1 tablet (3.125 mg total) by mouth 2 (two) times daily. 180 tablet 3  . finasteride (PROSCAR) 5 MG tablet Take 5 mg by mouth every morning.     . metolazone (ZAROXOLYN) 2.5 MG tablet Take 1 tablet (2.5 mg total) by mouth once a week. 30 tablet 0  . OVER THE COUNTER MEDICATION Apply 1 application topically 3 (three) times daily as needed (pain). CBD Cream    .  potassium chloride SA (KLOR-CON) 20 MEQ tablet Take 3 tablets (60 mEq total) by mouth daily. 181 tablet 0  . torsemide (DEMADEX) 20 MG tablet Take 4 tablets (80 mg total) by mouth 2 (two) times daily. 180 tablet 3  . traZODone (DESYREL) 50 MG tablet Take 0.5-1 tablets (25-50 mg total) by mouth at bedtime as needed for sleep. 90 tablet 1  . OVER THE COUNTER MEDICATION Place 1 drop into both eyes 2 (two) times daily as needed (itching/irritation). Similasin allergy eye relief     No current facility-administered medications on file prior to visit.    Allergies:  No Known Allergies   Physical Exam  Vitals:   10/22/19 1117  BP: (!) 88/67  Pulse: (!) 116  Temp: (!) 97.3 F (36.3 C)  Weight: 178 lb 14.4 oz (81.1 kg)  Height: 5\' 9"  (1.753 m)   Body mass index is 26.42 kg/m. No exam data present  Depression screen [facility] 2/9 10/22/2019  Decreased Interest 0  Down, Depressed, Hopeless 0  PHQ - 2 Score 0     General: well developed, well nourished, pleasant elderly Caucasian male, seated, in no evident distress Head: head normocephalic and atraumatic.   Neck: supple with no carotid or supraclavicular bruits Cardiovascular: irregular rate and rhythm, no murmurs; +3 pitting edema BLE; +2 pitting edema RUE Musculoskeletal:  no deformity Skin:  no rash/petichiae Vascular:  Normal pulses all extremities   Neurologic Exam Mental Status: Awake and fully alert.   Normal speech and language.  Oriented to place and time. Recent and remote memory intact. Attention span, concentration and fund of knowledge appropriate. Mood and affect appropriate.  MMSE - Mini Mental State Exam 10/22/2019  Orientation to time 5  Orientation to Place 5  Registration 3  Attention/ Calculation 5  Recall 3  Language- name 2 objects 2  Language- repeat 1  Language- follow 3 step command 3  Language- read & follow direction 1  Write a sentence 1  Copy design 1  Total score 30   Cranial Nerves: Fundoscopic exam reveals sharp disc margins. Pupils equal, briskly reactive to light. Extraocular movements full without nystagmus. Visual fields full to confrontation. Hearing intact. Facial sensation intact. Face, tongue, palate moves normally and symmetrically.  Motor: Normal bulk and tone. Normal strength in all tested extremity muscles. Sensory.: intact to touch , pinprick , position and vibratory sensation.  Coordination: Rapid alternating movements normal in all extremities. Finger-to-nose and heel-to-shin performed accurately bilaterally. Gait and Station: Arises from chair without difficulty. Stance is normal. Gait demonstrates normal stride length and balance with use of cane Reflexes: 1+ and symmetric. Toes downgoing.     NIHSS  0 Modified Rankin  0 CHA2DS2-VASc 6 HAS-BLED 3   Diagnostic Data (Labs, Imaging, Testing)  CT HEAD WO CONTRAST 09/07/2019 IMPRESSION: No acute intracranial abnormality.  09/08/2019 IMPRESSION: No acute intracranial pathology. No non-contrast CT evidence of acute stroke or hemorrhage. Small-vessel white matter disease.  VAS US CAROTID DUPLEX BILATERAL 09/08/2019 Summary: Right Carotid: The extracranial vessels were near-normal with only minimal wall                thickening or plaque. Left  Carotid: The extracranial vessels were near-normal with only minimal wall               thickening or plaque. Vertebrals: Bilateral vertebral arteries demonstrate antegrade flow.  ECHOCARDIOGRAM 09/08/2019 IMPRESSIONS  1. LV hypertrophy with appearance of myocardium concerning for infiltrative process such as amyloidosis. Consider PYP if  clinical suspicion for amyloid is high.  2. Biventricular severe dysfunction.  3. Left ventricular ejection fraction, by visual estimation, is 20 to 25%. The left ventricle has severely decreased function. There is severely increased left ventricular hypertrophy.  4. Elevated left ventricular end-diastolic pressure.  5. Left ventricular diastolic parameters are indeterminate.  6. The left ventricle demonstrates global hypokinesis.  7. Global right ventricle has severely reduced systolic function.The right ventricular size is mildly enlarged. Moderately increased right ventricular wall thickness.  8. Left atrial size was mildly dilated.  9. Right atrial size was moderately dilated. 10. Small pericardial effusion. 11. The mitral valve is abnormal. Mild mitral valve regurgitation. 12. The tricuspid valve is grossly normal. Tricuspid valve regurgitation moderate. 13. The aortic valve is tricuspid. Aortic valve regurgitation is not visualized. No evidence of aortic valve sclerosis or stenosis. 14. The pulmonic valve was grossly normal. Pulmonic valve regurgitation is not visualized. 15. Moderately elevated pulmonary artery systolic pressure. 16. A pacer wire is visualized in the RA and RV. 17. The inferior vena cava is dilated in size with <50% respiratory variability, suggesting right atrial pressure of 15 mmHg. 18. The tricuspid regurgitant velocity is 2.53 m/s, and with an assumed right atrial pressure of 15 mmHg, the estimated right ventricular systolic pressure is moderately elevated at 40.5 mmHg.  EEG 09/09/2019 IMPRESSION: This study is within normal  limits. No seizures or epileptiform discharges were seen throughout the recording.    ASSESSMENT: Charles Mcmahon is a 74 y.o. year old male presented with changes in mentation and generalized weakness on 09/07/2019 with stroke work-up possible stroke versus TIA also suspecting underlying mild cognitive impairment.  Unable to obtain MRI due to pacer.  Vascular risk factors include chronic atrial fibrillation on warfarin, CAD, HLD, advanced age, former tobacco use, EtOH use, nonischemic cardiomyopathy on Lasix and CHB s/p pacer.  He has recovered well from a stroke standpoint without residual deficits or reoccurring symptoms.  He does not have any residual memory loss or confusion.  He was recently seen by cardiology who discontinued warfarin and initiated Eliquis along with recently undergoing cardiac cath for further evaluation of CHF    PLAN:  1. TIA versus possible stroke: Continue Eliquis (apixaban) daily for secondary stroke prevention. Maintain strict control of hypertension with blood pressure goal below 130/90, diabetes with hemoglobin A1c goal below 6.5% and cholesterol with LDL cholesterol (bad cholesterol) goal below 70 mg/dL.  I also advised the patient to eat a healthy diet with plenty of whole grains, cereals, fruits and vegetables, exercise regularly with at least 30 minutes of continuous activity daily and maintain ideal body weight. 2. HLD: Not currently on statin.  Recommend ongoing monitoring by PCP and initiate statin if needed 3. Cardiac history: Atrial fibrillation, nonischemic cardiomyopathy, CAD and CHF. continue to follow with cardiology as scheduled 4. He requests return to driving.  From today's exam and history, no concern from a neurological standpoint regarding patient return to driving as he recovered well without residual deficits and no indication for memory/cognitive impairment.  Discussion regarding gradual return to driving for which patient verbalized  understanding    Follow up in 6 months or call earlier if needed   Greater than 50% of time during this 30 minute visit was spent on counseling, explanation of diagnosis of TIA versus possible stroke, reviewing risk factor management of HLD and extensive cardiac history, planning of further management along with potential future management, and discussion with patient and family answering all questions.  Frann Rider, AGNP-BC  [facility] [address] [place], [place] [address]  Phone [phone] Fax [phone] Note: This document was prepared with digital dictation and possible smart phrase technology. Any transcriptional errors that result from this process are unintentional.

## 2019-10-23 ENCOUNTER — Encounter (HOSPITAL_COMMUNITY): Payer: Self-pay | Admitting: Cardiology

## 2019-10-23 ENCOUNTER — Ambulatory Visit (HOSPITAL_COMMUNITY)
Admission: RE | Admit: 2019-10-23 | Discharge: 2019-10-23 | Disposition: A | Discharge status: Home / Self Care | Payer: Medicare Other | Source: Ambulatory Visit | Attending: Cardiology | Admitting: Cardiology

## 2019-10-23 ENCOUNTER — Other Ambulatory Visit: Payer: Self-pay

## 2019-10-23 VITALS — BP 101/46 | HR 71 | Wt 180.4 lb

## 2019-10-23 DIAGNOSIS — Z7901 Long term (current) use of anticoagulants: Secondary | ICD-10-CM | POA: Insufficient documentation

## 2019-10-23 DIAGNOSIS — Z8249 Family history of ischemic heart disease and other diseases of the circulatory system: Secondary | ICD-10-CM | POA: Diagnosis not present

## 2019-10-23 DIAGNOSIS — N183 Chronic kidney disease, stage 3 unspecified: Secondary | ICD-10-CM | POA: Diagnosis not present

## 2019-10-23 DIAGNOSIS — Z87891 Personal history of nicotine dependence: Secondary | ICD-10-CM | POA: Diagnosis not present

## 2019-10-23 DIAGNOSIS — I4819 Other persistent atrial fibrillation: Secondary | ICD-10-CM | POA: Diagnosis not present

## 2019-10-23 DIAGNOSIS — Z950 Presence of cardiac pacemaker: Secondary | ICD-10-CM | POA: Diagnosis not present

## 2019-10-23 DIAGNOSIS — Z79899 Other long term (current) drug therapy: Secondary | ICD-10-CM | POA: Insufficient documentation

## 2019-10-23 DIAGNOSIS — M109 Gout, unspecified: Secondary | ICD-10-CM | POA: Diagnosis not present

## 2019-10-23 DIAGNOSIS — I5042 Chronic combined systolic (congestive) and diastolic (congestive) heart failure: Secondary | ICD-10-CM | POA: Diagnosis not present

## 2019-10-23 DIAGNOSIS — I428 Other cardiomyopathies: Secondary | ICD-10-CM | POA: Insufficient documentation

## 2019-10-23 DIAGNOSIS — Z85820 Personal history of malignant melanoma of skin: Secondary | ICD-10-CM | POA: Diagnosis not present

## 2019-10-23 DIAGNOSIS — I5022 Chronic systolic (congestive) heart failure: Secondary | ICD-10-CM | POA: Diagnosis present

## 2019-10-23 DIAGNOSIS — I509 Heart failure, unspecified: Secondary | ICD-10-CM | POA: Diagnosis not present

## 2019-10-23 DIAGNOSIS — I4821 Permanent atrial fibrillation: Secondary | ICD-10-CM

## 2019-10-23 DIAGNOSIS — I442 Atrioventricular block, complete: Secondary | ICD-10-CM | POA: Diagnosis not present

## 2019-10-23 LAB — CUP PACEART INCLINIC DEVICE CHECK
Date Time Interrogation Session: 20201210110943
Implantable Lead Implant Date: 20100628
Implantable Lead Implant Date: 20180605
Implantable Lead Implant Date: 20180605
Implantable Lead Location: 753858
Implantable Lead Location: 753860
Implantable Lead Location: 753862
Implantable Pulse Generator Implant Date: 20180605
Pulse Gen Model: 3262
Pulse Gen Serial Number: 4952436

## 2019-10-23 LAB — BASIC METABOLIC PANEL
Anion gap: 10 (ref 5–15)
BUN: 57 mg/dL — ABNORMAL HIGH (ref 8–23)
CO2: 29 mmol/L (ref 22–32)
Calcium: 8.6 mg/dL — ABNORMAL LOW (ref 8.9–10.3)
Chloride: 96 mmol/L — ABNORMAL LOW (ref 98–111)
Creatinine, Ser: 1.69 mg/dL — ABNORMAL HIGH (ref 0.61–1.24)
GFR calc Af Amer: 45 mL/min — ABNORMAL LOW (ref >60)
GFR calc non Af Amer: 39 mL/min — ABNORMAL LOW (ref >60)
Glucose, Bld: 116 mg/dL — ABNORMAL HIGH (ref 70–99)
Potassium: 4.2 mmol/L (ref 3.5–5.1)
Sodium: 135 mmol/L (ref 135–145)

## 2019-10-23 MED ORDER — POTASSIUM CHLORIDE 20 MEQ/15ML (10%) PO SOLN: 60.0000 meq | Freq: Every day | ORAL | 3 refills | Status: DC

## 2019-10-23 MED ORDER — METOLAZONE 2.5 MG PO TABS: ORAL_TABLET | ORAL | 4 refills | Status: DC

## 2019-10-23 MED ORDER — SPIRONOLACTONE 25 MG PO TABS: 12.5000 mg | ORAL_TABLET | Freq: Every day | ORAL | 3 refills | Status: DC

## 2019-10-23 MED ORDER — METOLAZONE 2.5 MG PO TABS: 2.5000 mg | ORAL_TABLET | ORAL | 4 refills | Status: DC

## 2019-10-23 NOTE — Patient Instructions (Addendum)
PYP scan is on January 13th, 2021 at 9am.  This test is located in the [place] [facility] (Entrance A).  START Spironolactone 12.5mg  (1/2 tab) daily  TAKE Metolazone 2.5mg  today with torsemide and again tomorrow.  THEN take Metolazone 2.5 mg (1 tab) twice a week on Mondays and Thursdays going forward.  Your potassium was changed to liquid form  Labs today and repeat in 2 weeks We will only contact you if something comes back abnormal or we need to make some changes. Otherwise no news is good news!  Your physician recommends that you schedule a follow-up appointment in: 3 weeks with Nurse Practitioner   Please call office at [phone] option 2 if you have any questions or concerns.    At the [facility], you and your health needs are our priority. As part of our continuing mission to provide you with exceptional heart care, we have created designated Provider Care Teams. These Care Teams include your primary Cardiologist (physician) and Advanced Practice Providers (APPs- Physician Assistants and Nurse Practitioners) who all work together to provide you with the care you need, when you need it.   You may see any of the following providers on your designated Care Team at your next follow up: Marland Kitchen Dr Glori Bickers . Dr Loralie Champagne . Darrick Grinder, NP . Lyda Jester, PA . Audry Riles, PharmD   Please be sure to bring in all your medications bottles to every appointment.

## 2019-10-25 NOTE — Progress Notes (Signed)
PCP: [name], [name], MD HF Cardiology: Dr. Aundra Dubin EP: Dr. Caryl Comes  74 y.o. with history of complete heart block s/p pacemaker, persistent atrial fibrillation, and chronic systolic CHF was referred by Dr. Caryl Comes for evaluation of HF.  Patient is originally from [place].  In 2010, he developed complete heart block (given geographic location, would question whether this could be Lyme carditis-related) and had PPM placed.  LV EF was noted to fall over time, thought to be due to RV pacing.  Cath in 4/14 in setting of EF 35% showed no significant coronary disease.  In 12/18, his device was upgraded to [facility] CRT-P (declined ICD), but EF remained low.  Last echo in 11/20 showed EF 20-25%, severe LVH (speckled myocardium), severely decreased RV systolic function. He also has persistent atrial fibrillation (most of the time in atrial fibrillation though NSR noted on at least 1 occasion).    He had RHC in 12/20 showed preserved cardiac output but elevated right and left heart filling pressures.   Patient returns for followup of CHF.  He says that he drank a lot of fluid yesterday and feels swollen today. No dyspnea walking around in his house, but he is short of breath walking farther distances.  He continues to have orthopnea.  Still with significant edema.  No chest pain.  Weight is up about 4 lbs.   Labs (11/20): K 3.6, creatinine 1.77 Labs (12/20): K 2.5 => 3.5, creatinine 1.87 => 1.67, myeloma panel negative  PMH: 1. Complete heart block: 2010, ??Lyme carditis. Received PPM.  2. Gout  3. Atrial fibrillation: Paroxysmal.  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  5. Chronic systolic CHF: Nonischemic cardiomyopathy.  Initially, this was thought to be due to chronic RV pacing.  - Cath in 4/14: EF 35%, no significant CAD.  - [employer] Jude CRT-P upgrade in 6/18 (he declined ICD).  -  Echo (11/20): EF 20-25%, severe LVH with speckled myocardium, mildly dilated RV with severely decreased systolic function, PASP 41 mmHg.   - RHC (12/20): mean RA 14, PA 52/20 mean 31, mean PCWP 25, CI 2.41.  6. H/o melanoma  Social History   Socioeconomic History  . Marital status: Married    Spouse name: Not on file  . Number of children: 2  . Years of education: Not on file  . Highest education level: Not on file  Occupational History  . Not on file  Tobacco Use  . Smoking status: Former Research scientist (life sciences)  . Smokeless tobacco: Never Used  Substance and Sexual Activity  . Alcohol use: Yes  . Drug use: Never  . Sexual activity: Not Currently  Other Topics Concern  . Not on file  Social History Narrative  . Not on file   Social Determinants of Health   Financial Resource Strain:   . Difficulty of Paying Living Expenses: Not on file  Food Insecurity:   . Worried About Charity fundraiser in the Last Year: Not on file  . Ran Out of Food in the Last Year: Not on file  Transportation Needs:   . Lack of Transportation (Medical): Not on file  . Lack of Transportation (Non-Medical): Not on file  Physical Activity:   . Days of Exercise per Week: Not on file  . Minutes of Exercise per Session: Not on file  Stress:   . Feeling of Stress : Not on file  Social Connections:   . Frequency of Communication with Friends and Family: Not on file  . Frequency of  Social Gatherings with Friends and Family: Not on file  . Attends Religious Services: Not on file  . Active Member of Clubs or Organizations: Not on file  . Attends Archivist Meetings: Not on file  . Marital Status: Not on file  Intimate Partner Violence:   . Fear of Current or Ex-Partner: Not on file  . Emotionally Abused: Not on file  . Physically Abused: Not on file  . Sexually Abused: Not on file   Family history: Grandfather with MI.  No other cardiac disease that he is aware of.   ROS: All systems reviewed and negative except as per HPI.   Current Outpatient Medications  Medication Sig Dispense Refill  . acetaminophen (TYLENOL) 650 MG CR tablet  Take 650 mg by mouth every 8 (eight) hours.    Marland Kitchen albuterol (VENTOLIN HFA) 108 (90 Base) MCG/ACT inhaler Inhale 2 puffs into the lungs every 6 (six) hours as needed for wheezing or shortness of breath. 54 g 3  . allopurinol (ZYLOPRIM) 100 MG tablet Take 100 mg by mouth 2 (two) times daily.    Marland Kitchen apixaban (ELIQUIS) 5 MG TABS tablet Take 1 tablet (5 mg total) by mouth 2 (two) times daily. 180 tablet 0  . carvedilol (COREG) 3.125 MG tablet Take 1 tablet (3.125 mg total) by mouth 2 (two) times daily. 180 tablet 3  . finasteride (PROSCAR) 5 MG tablet Take 5 mg by mouth every morning.     . metolazone (ZAROXOLYN) 2.5 MG tablet Take 1 tablet (2.5 mg total) by mouth 2 (two) times a week. Take 1 tab every Monday and Thursday 12 tablet 4  . OVER THE COUNTER MEDICATION Apply 1 application topically 3 (three) times daily as needed (pain). CBD Cream    . OVER THE COUNTER MEDICATION Place 1 drop into both eyes 2 (two) times daily as needed (itching/irritation). Similasin allergy eye relief    . torsemide (DEMADEX) 20 MG tablet Take 4 tablets (80 mg total) by mouth 2 (two) times daily. 180 tablet 3  . traZODone (DESYREL) 50 MG tablet Take 0.5-1 tablets (25-50 mg total) by mouth at bedtime as needed for sleep. 90 tablet 1  . potassium chloride 20 MEQ/15ML (10%) SOLN Take 45 mLs (60 mEq total) by mouth daily. 473 mL 3  . spironolactone (ALDACTONE) 25 MG tablet Take 0.5 tablets (12.5 mg total) by mouth daily. 90 tablet 3   No current facility-administered medications for this encounter.   BP (!) 101/46   Pulse 71   Wt 81.8 kg (180 lb 6.4 oz)   SpO2 100%   BMI 26.64 kg/m  General: NAD Neck: JVP 10-12 cm, no thyromegaly or thyroid nodule.  Lungs: Clear to auscultation bilaterally with normal respiratory effort. CV: Nondisplaced PMI.  Heart regular S1/S2, no S3/S4, no murmur.  2+ edema to knees.  No carotid bruit.  Normal pedal pulses.  Abdomen: Soft, nontender, no hepatosplenomegaly, no distention.  Skin:  Intact without lesions or rashes.  Neurologic: Alert and oriented x 3.  Psych: Normal affect. Extremities: No clubbing or cyanosis.  HEENT: Normal.   Assessment/Plan: 1. Chronic systolic CHF: Nonischemic based on LHC from 2014 in [place].  EF has been low since that time.  Initially thought to be due to RV pacing, but EF did not improve significantly after St Jude CRT-P upgrade in 6/18 (declined ICD).  Echo in 11/20 with EF 20-25%, severe LVH, severely decreased RV systolic function.  He has struggled with volume overload.  Today, he  is still significantly volume overloaded on exam despite taking NYHA class III symptoms.  - Continue torsemide 80 mg bid.   - Metolazone 2.5 mg x 1 today with am torsemide, then again tomorrow, then Monday.  After that, I want him to take metolazone 2.5 mg with am torsemide on Monday and Thursday.  BMET today and again in ieeiie - Continue KCl 60 mEq daily, he would like to change from pills to liquid (will arrange).   - Continue Coreg 3.125 mg bid, no BP room to increase.  - Losartan recently stopped due to elevated creatinine and low BP.  Would avoid ARB/ACEI/ARNI for now with CKD.  - Start spironolactone 12.5 mg daily.  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  Myeloma panel was negative, he is pending PYP scan.  - Needs to follow low Na and low fluid diet. Recent noncompliance with fluid restriction.  2. Persistent atrial fibrillation: Most of the time, it appears he is in atrial fibrillation.   - Continue Eliquis 5 mg bid.  3. Complete heart block: Occurred in [place], not related to MI.  ?Lyme carditis (no record of whether this was tested or not, he is not sure).  Lyme carditis could also explain the persistently low EF.  4. CKD: Stage 3.  Follow creatinine closely with diuresis as above.   Followup with NP/PA in 3 wks.   Loralie Champagne 10/25/2019

## 2019-10-27 NOTE — Progress Notes (Signed)
I agree with the above plan 

## 2019-10-28 ENCOUNTER — Other Ambulatory Visit: Payer: Self-pay | Admitting: Internal Medicine

## 2019-10-29 ENCOUNTER — Encounter (HOSPITAL_COMMUNITY)
Admission: RE | Admit: 2019-10-29 | Discharge: 2019-10-29 | Disposition: A | Discharge status: Home / Self Care | Payer: Medicare Other | Source: Ambulatory Visit | Attending: Cardiology | Admitting: Cardiology

## 2019-10-29 ENCOUNTER — Other Ambulatory Visit: Payer: Self-pay

## 2019-10-29 DIAGNOSIS — I428 Other cardiomyopathies: Secondary | ICD-10-CM | POA: Insufficient documentation

## 2019-10-29 MED ORDER — TECHNETIUM TC 99M PYROPHOSPHATE
21.0000 | Freq: Once | INTRAVENOUS | Status: AC | PRN
  Administered 2019-10-29: 21 via INTRAVENOUS
  Filled 2019-10-29: qty 21

## 2019-10-31 ENCOUNTER — Other Ambulatory Visit: Payer: Self-pay

## 2019-11-03 ENCOUNTER — Other Ambulatory Visit: Payer: Self-pay

## 2019-11-03 ENCOUNTER — Encounter: Payer: Self-pay | Admitting: Family Medicine

## 2019-11-03 ENCOUNTER — Ambulatory Visit (INDEPENDENT_AMBULATORY_CARE_PROVIDER_SITE_OTHER): Payer: Medicare Other | Admitting: Family Medicine

## 2019-11-03 VITALS — BP 100/70 | HR 60 | Temp 95.6°F | Resp 16 | Ht 69.0 in | Wt 177.0 lb

## 2019-11-03 DIAGNOSIS — R7989 Other specified abnormal findings of blood chemistry: Secondary | ICD-10-CM

## 2019-11-03 DIAGNOSIS — N184 Chronic kidney disease, stage 4 (severe): Secondary | ICD-10-CM

## 2019-11-03 DIAGNOSIS — I5020 Unspecified systolic (congestive) heart failure: Secondary | ICD-10-CM

## 2019-11-03 DIAGNOSIS — G459 Transient cerebral ischemic attack, unspecified: Secondary | ICD-10-CM | POA: Diagnosis not present

## 2019-11-03 DIAGNOSIS — I119 Hypertensive heart disease without heart failure: Secondary | ICD-10-CM

## 2019-11-03 DIAGNOSIS — R945 Abnormal results of liver function studies: Secondary | ICD-10-CM | POA: Diagnosis not present

## 2019-11-03 NOTE — Patient Instructions (Signed)
A few things to remember from today's visit:   Hypertension with heart disease  Chronic kidney disease, stage 4 (severe) ([place]), Chronic  No changes today. I will place a referral to kidney doctor.  Please be sure medication list is accurate. If a new problem present, please set up appointment sooner than planned today.

## 2019-11-03 NOTE — Progress Notes (Signed)
HPI:   Charles Mcmahon is a 74 y.o. male, who is here today for chronic disease management.  He was last seen on 10/03/2019, when he was complaining about insomnia and requested medication to help with problem. Trazodone 50 mg was recommended. He did not take medication.  He states that some of his medications were changed and his sleep improved.  Hypertension and CKD 4, he has not seen nephrologist yet. Negative for gross hematuria, foam in urine, or decreased urine output. Edema is stable. Currently he is on Coreg 3.125 mg twice daily and spironolactone 25 mg 1/2 tablet daily. He takes torsemide 80 mg twice daily and metolazone 2.5 mg twice per week. Negative for chest pain, dyspnea, palpitation, and dizziness.  HFrEF, Echo on 09/08/19: LVEF 20 to 25%, left ventricular hypertrophy with appearance of myocardium concerning for infiltrative process like amyloidosis. He denies orthopnea and PND.  He has an appointment with cardiologist later this month.  Lab Results  Component Value Date   CREATININE 1.69 (H) 10/23/2019   BUN 57 (H) 10/23/2019   NA 135 10/23/2019   K 4.2 10/23/2019   CL 96 (L) 10/23/2019   CO2 29 10/23/2019   Abnormal liver function test. Negative for jaundice, nausea, vomiting, or abdominal pain.  Lab Results  Component Value Date   ALT 23 09/08/2019   AST 47 (H) 09/08/2019   ALKPHOS 369 (H) 09/08/2019   BILITOT 1.7 (H) 09/08/2019   Since his last visit he has seen neurologist due to TIA. He has not noted frequent/unusual headache, visual changes, or new focal deficit. Currently he is on Eliquis 5 mg twice daily for atrial fib.  Review of Systems  Constitutional: Negative for activity change, appetite change, fatigue and fever.  HENT: Negative for mouth sores, nosebleeds and sore throat.   Respiratory: Negative for cough and wheezing.   Cardiovascular: Positive for leg swelling.  Gastrointestinal: Negative for abdominal distention.    No changes in bowel habits.  Musculoskeletal: Positive for gait problem.  Neurological: Negative for syncope, facial asymmetry and light-headedness.  Psychiatric/Behavioral: Negative for confusion and sleep disturbance.  Rest of ROS, see pertinent positives sand negatives in HPI  Current Outpatient Medications on File Prior to Visit  Medication Sig Dispense Refill  . acetaminophen (TYLENOL) 650 MG CR tablet Take 650 mg by mouth every 8 (eight) hours.    Marland Kitchen albuterol (VENTOLIN HFA) 108 (90 Base) MCG/ACT inhaler Inhale 2 puffs into the lungs every 6 (six) hours as needed for wheezing or shortness of breath. 54 g 3  . allopurinol (ZYLOPRIM) 100 MG tablet Take 100 mg by mouth 2 (two) times daily.    Marland Kitchen apixaban (ELIQUIS) 5 MG TABS tablet Take 1 tablet (5 mg total) by mouth 2 (two) times daily. 180 tablet 0  . carvedilol (COREG) 3.125 MG tablet Take 1 tablet (3.125 mg total) by mouth 2 (two) times daily. 180 tablet 3  . finasteride (PROSCAR) 5 MG tablet Take 5 mg by mouth every morning.     . metolazone (ZAROXOLYN) 2.5 MG tablet Take 1 tablet (2.5 mg total) by mouth 2 (two) times a week. Take 1 tab every Monday and Thursday 12 tablet 4  . OVER THE COUNTER MEDICATION Apply 1 application topically 3 (three) times daily as needed (pain). CBD Cream    . OVER THE COUNTER MEDICATION Place 1 drop into both eyes 2 (two) times daily as needed (itching/irritation). Similasin allergy eye relief    . potassium  chloride 20 MEQ/15ML (10%) SOLN Take 45 mLs (60 mEq total) by mouth daily. 473 mL 3  . spironolactone (ALDACTONE) 25 MG tablet Take 0.5 tablets (12.5 mg total) by mouth daily. 90 tablet 3  . torsemide (DEMADEX) 20 MG tablet Take 4 tablets (80 mg total) by mouth 2 (two) times daily. 180 tablet 3   No current facility-administered medications on file prior to visit.     Past Medical History:  Diagnosis Date  . Allergy   . Arthritis   . Asthma   . Cancer ([place])   . Cardiac arrhythmia due to congenital  heart disease   . Coronary artery disease   . Heart disease   . Heart disease   . Heart murmur   . Hx: UTI (urinary tract infection)    No Known Allergies  Social History   Socioeconomic History  . Marital status: Married    Spouse name: Not on file  . Number of children: 2  . Years of education: Not on file  . Highest education level: Not on file  Occupational History  . Not on file  Tobacco Use  . Smoking status: Former Research scientist (life sciences)  . Smokeless tobacco: Never Used  Substance and Sexual Activity  . Alcohol use: Yes  . Drug use: Never  . Sexual activity: Not Currently  Other Topics Concern  . Not on file  Social History Narrative  . Not on file   Social Determinants of Health   Financial Resource Strain:   . Difficulty of Paying Living Expenses: Not on file  Food Insecurity:   . Worried About Charity fundraiser in the Last Year: Not on file  . Ran Out of Food in the Last Year: Not on file  Transportation Needs:   . Lack of Transportation (Medical): Not on file  . Lack of Transportation (Non-Medical): Not on file  Physical Activity:   . Days of Exercise per Week: Not on file  . Minutes of Exercise per Session: Not on file  Stress:   . Feeling of Stress : Not on file  Social Connections:   . Frequency of Communication with Friends and Family: Not on file  . Frequency of Social Gatherings with Friends and Family: Not on file  . Attends Religious Services: Not on file  . Active Member of Clubs or Organizations: Not on file  . Attends Archivist Meetings: Not on file  . Marital Status: Not on file    Vitals:   11/03/19 1144  BP: 100/70  Pulse: 60  Resp: 16  Temp: (!) 95.6 F (35.3 C)   Body mass index is 26.14 kg/m.   Physical Exam  Nursing note reviewed. Constitutional: He is oriented to person, place, and time. He appears well-developed. No distress.  HENT:  Head: Normocephalic and atraumatic.  Eyes: Conjunctivae are normal.  Cardiovascular:  Normal rate and regular rhythm.  No murmur heard. Respiratory: Effort normal and breath sounds normal. No respiratory distress.  GI: Soft. There is no abdominal tenderness.  Musculoskeletal:        General: Edema (2+ pitting LE edema,bilateral.) present.  Neurological: He is alert and oriented to person, place, and time. He has normal strength. No cranial nerve deficit.  Unstable gait assisted withy a cane.  Skin: Skin is warm. No rash noted. No erythema.  Psychiatric: His affect is blunt.  Well groomed, good eye contact.   ASSESSMENT AND PLAN:  Charles Mcmahon was seen today for chronic disease management.  Diagnoses and all orders for this visit:  Orders Placed This Encounter  Procedures  . Ambulatory referral to Nephrology    Chronic kidney disease, stage 4 (severe) ([place]) Has not yet established with nephrologist. New referral placed.  Hypertension with heart disease His BP today mildly low. Reviewing records, he has had lower BPs during office visits. 10/22/2019 BP was 88/67. He seems to be asymptomatic. No changes in current management. Instructed about warning signs. He has an appointment with cardiologist later this month.  Abnormal LFTs (liver function tests) We will plan on repeating liver function test next visit.  TIA (transient ischemic attack) Currently he is not taking a statin medication. Because on Eliquis, he is not on Aspirin. Following with neurologist.  HFrEF (heart failure with reduced ejection fraction) (HCC) On Coreg, torsemide, and spironolactone. Because CKD 4 is not a good candidate for ACE inhibitors or ARB's. Follows with cardiologist.   He also wanted to discuss some issues with his wife medications,he is upset. Requesting refills on her benzodiazepine. Apparently, he has been having issues with a new mail order. We addressed this issue separately.  Return in about 5 months (around 04/01/2020).    Sahirah Rudell G. [name], MD  [facility]. [place] office.

## 2019-11-06 ENCOUNTER — Encounter: Payer: Self-pay | Admitting: Family Medicine

## 2019-11-06 ENCOUNTER — Ambulatory Visit (HOSPITAL_COMMUNITY)
Admission: RE | Admit: 2019-11-06 | Discharge: 2019-11-06 | Disposition: A | Discharge status: Home / Self Care | Payer: Medicare Other | Source: Ambulatory Visit | Attending: Cardiology | Admitting: Cardiology

## 2019-11-06 ENCOUNTER — Other Ambulatory Visit: Payer: Self-pay

## 2019-11-06 ENCOUNTER — Other Ambulatory Visit (HOSPITAL_COMMUNITY): Payer: Self-pay

## 2019-11-06 DIAGNOSIS — I509 Heart failure, unspecified: Secondary | ICD-10-CM | POA: Insufficient documentation

## 2019-11-06 LAB — BASIC METABOLIC PANEL
Anion gap: 12 (ref 5–15)
BUN: 67 mg/dL — ABNORMAL HIGH (ref 8–23)
CO2: 29 mmol/L (ref 22–32)
Calcium: 8.4 mg/dL — ABNORMAL LOW (ref 8.9–10.3)
Chloride: 92 mmol/L — ABNORMAL LOW (ref 98–111)
Creatinine, Ser: 1.68 mg/dL — ABNORMAL HIGH (ref 0.61–1.24)
GFR calc Af Amer: 46 mL/min — ABNORMAL LOW (ref >60)
GFR calc non Af Amer: 39 mL/min — ABNORMAL LOW (ref >60)
Glucose, Bld: 113 mg/dL — ABNORMAL HIGH (ref 70–99)
Potassium: 3.4 mmol/L — ABNORMAL LOW (ref 3.5–5.1)
Sodium: 133 mmol/L — ABNORMAL LOW (ref 135–145)

## 2019-11-06 MED ORDER — POTASSIUM CHLORIDE 20 MEQ/15ML (10%) PO SOLN: 80.0000 meq | Freq: Every day | ORAL | 3 refills | Status: DC

## 2019-11-10 ENCOUNTER — Ambulatory Visit (INDEPENDENT_AMBULATORY_CARE_PROVIDER_SITE_OTHER): Payer: Medicare Other | Admitting: Family Medicine

## 2019-11-10 ENCOUNTER — Encounter: Payer: Self-pay | Admitting: Family Medicine

## 2019-11-10 ENCOUNTER — Other Ambulatory Visit: Payer: Self-pay

## 2019-11-10 VITALS — Temp 95.7°F | Ht 69.0 in

## 2019-11-10 DIAGNOSIS — Z636 Dependent relative needing care at home: Secondary | ICD-10-CM | POA: Diagnosis not present

## 2019-11-10 NOTE — Progress Notes (Signed)
HPI:   Charles Mcmahon is a 74 y.o. male, who is here today requesting a letter that give him the power of managing his wife's finances, this is for [employer]  He was last seen on 11/03/19. His wife has Hx if CVA,CAD, and DM II.  He and his wife recently moved to this area to be closer to their daughter to help with her care.  He has managed her finances and needs a letter to continue doing so. 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 CVA with residual aphasia,she is emotional and impulsive,and she cannot read.  He has Hx of atrial fib,HFrEF,TIA,and CKD.   Review of Systems  Musculoskeletal: Positive for gait problem.  Neurological: Negative for headaches.  Psychiatric/Behavioral: Negative for confusion. The patient is not nervous/anxious.   Rest of ROS, see pertinent positives sand negatives in HPI   Current Outpatient Medications on File Prior to Visit  Medication Sig Dispense Refill  . acetaminophen (TYLENOL) 650 MG CR tablet Take 650 mg by mouth every 8 (eight) hours.    Marland Kitchen albuterol (VENTOLIN HFA) 108 (90 Base) MCG/ACT inhaler Inhale 2 puffs into the lungs every 6 (six) hours as needed for wheezing or shortness of breath. 54 g 3  . allopurinol (ZYLOPRIM) 100 MG tablet Take 100 mg by mouth 2 (two) times daily.    Marland Kitchen apixaban (ELIQUIS) 5 MG TABS tablet Take 1 tablet (5 mg total) by mouth 2 (two) times daily. 180 tablet 0  . carvedilol (COREG) 3.125 MG tablet Take 1 tablet (3.125 mg total) by mouth 2 (two) times daily. 180 tablet 3  . finasteride (PROSCAR) 5 MG tablet Take 5 mg by mouth every morning.     . metolazone (ZAROXOLYN) 2.5 MG tablet Take 1 tablet (2.5 mg total) by mouth 2 (two) times a week. Take 1 tab every Monday and Thursday 12 tablet 4  . OVER THE COUNTER MEDICATION Apply 1 application topically 3 (three) times daily as needed (pain). CBD Cream    . OVER  THE COUNTER MEDICATION Place 1 drop into both eyes 2 (two) times daily as needed (itching/irritation). Similasin allergy eye relief    . potassium chloride 20 MEQ/15ML (10%) SOLN Take 60 mLs (80 mEq total) by mouth daily. 473 mL 3  . spironolactone (ALDACTONE) 25 MG tablet Take 0.5 tablets (12.5 mg total) by mouth daily. 90 tablet 3  . torsemide (DEMADEX) 20 MG tablet Take 4 tablets (80 mg total) by mouth 2 (two) times daily. 180 tablet 3   No current facility-administered medications on file prior to visit.     Past Medical History:  Diagnosis Date  . Allergy   . Arthritis   . Asthma   . Cancer ([place])   . Cardiac arrhythmia due to congenital heart disease   . Coronary artery disease   . Heart disease   . Heart disease   . Heart murmur   . Hx: UTI (urinary tract infection)    No Known Allergies  Social History   Socioeconomic History  . Marital status: Married    Spouse name: Not on file  . Number of children: 2  . Years of education: Not on file  . Highest education level: Not on file  Occupational History  . Not on file  Tobacco Use  . Smoking status: Former Research scientist (life sciences)  . Smokeless tobacco: Never Used  Substance and Sexual Activity  . Alcohol use: Yes  . Drug use: Never  . Sexual activity: Not Currently  Other Topics Concern  . Not on file  Social History Narrative  . Not on file   Social Determinants of Health   Financial Resource Strain:   . Difficulty of Paying Living Expenses: Not on file  Food Insecurity:   . Worried About Charity fundraiser in the Last Year: Not on file  . Ran Out of Food in the Last Year: Not on file  Transportation Needs:   . Lack of Transportation (Medical): Not on file  . Lack of Transportation (Non-Medical): Not on file  Physical Activity:   . Days of Exercise per Week: Not on file  . Minutes of Exercise per Session: Not on file  Stress:   . Feeling of Stress : Not on file  Social Connections:   . Frequency of Communication with  Friends and Family: Not on file  . Frequency of Social Gatherings with Friends and Family: Not on file  . Attends Religious Services: Not on file  . Active Member of Clubs or Organizations: Not on file  . Attends Archivist Meetings: Not on file  . Marital Status: Not on file    Vitals:   11/10/19 1551  Temp: (!) 95.7 F (35.4 C)   Body mass index is 26.14 kg/m.   Physical Exam  Nursing note and vitals reviewed. Constitutional: He is oriented to person, place, and time. He appears well-developed.  HENT:  Head: Normocephalic and atraumatic.  Eyes: Conjunctivae are normal.  Respiratory: Effort normal. No respiratory distress.  Neurological: He is alert and oriented to person, place, and time. No cranial nerve deficit.  UNstable gait assisted with a cane.  Psychiatric: He has a normal mood and affect.  Well groomed,good eye contact.     ASSESSMENT AND PLAN:.    Charles Mcmahon was seen today requesting letter to continue managing her wife's finances.  1. Dependent family member needing care at home He has been taking care of his wife for 10 years. His wife is not here today. He states that she cannot used the phone. He will go home and I will call in a few minutes to speak with Charles Mcmahon.  Spoke with him and wife for 6 min.  She can tell me year, does not remmember month.  She feels comfortable for her husband to manage her finances,which he has done for years, and states that he "does a good job."   25 min face to face OV. > 50% was dedicated to discussion of legal implications and the importance of corroborate information with his wofe. Letter will be issued.   Return for Keep next appt.   Charles Orea G. [name], MD  [facility]. [place] office.

## 2019-11-11 ENCOUNTER — Encounter: Payer: Self-pay | Admitting: Family Medicine

## 2019-11-12 ENCOUNTER — Telehealth: Payer: Self-pay | Admitting: Family Medicine

## 2019-11-12 ENCOUNTER — Encounter: Payer: Medicare Other | Admitting: Internal Medicine

## 2019-11-12 NOTE — Progress Notes (Deleted)
      Patient Care Team: [name], [name], MD as PCP - General (Family Medicine) Buford Dresser, MD as PCP - Cardiology (Cardiology)   HPI  Charles Mcmahon is a 74 y.o. male Seen in follow-up for consideration of CRT-ICD upgrade for complete heart block the previously implanted pacemaker.  He was noted on evaluation to have severe left ventricular hypertrophy without history of hypertension, a speckled pattern on echo and I thought assistance from Dr. DM.  He is undergone catheterization demonstrating preserved cardiac output but elevated filling pressures with it without equalization.    DATE TEST EF        6/19 LHC    %  Normal CA PPCWP 41  12/19 Echo  27% LVH 17/67mm  3/20 Echo  33% LVH12./16  11/20 Echo   20-25 % LVH  17/23 BAE   1/21 PYP  Strongly suggestive of TTR amyloid     Date Cr K Hgb  9+/20 2.38 3.4   11/20 1.77 3.6 13.6         Thromboembolic risk factors ( age -35 , CHF-1) for a CHADSVASc Score of 2    Records and Results Reviewed***  Past Medical History:  Diagnosis Date  . Allergy   . Arthritis   . Asthma   . Cancer ([place])   . Cardiac arrhythmia due to congenital heart disease   . Coronary artery disease   . Heart disease   . Heart disease   . Heart murmur   . Hx: UTI (urinary tract infection)     Past Surgical History:  Procedure Laterality Date  . CHOLECYSTECTOMY  2008  . HERNIA REPAIR  2019   right side anguler  . MELANOMA EXCISION Right 1993   right arm  . PACEMAKER IMPLANT    . REPLACEMENT TOTAL KNEE Right   . RIGHT HEART CATH N/A 10/15/2019   Procedure: RIGHT HEART CATH;  Surgeon: Larey Dresser, MD;  Location: [facility];  Service: Cardiovascular;  Laterality: N/A;    No outpatient medications have been marked as taking for the 11/12/19 encounter (Appointment) with Deboraha Sprang, MD.    No Known Allergies    Review of Systems negative except from HPI and PMH  Physical Exam There were no  vitals taken for this visit. Well developed and well nourished in no acute distress HENT normal E scleral and icterus clear Neck Supple JVP flat; carotids brisk and full Clear to ausculation {CARD RHYTHM:10874} ***Regular rate and rhythm, no murmurs gallops or rub Soft with active bowel sounds No clubbing cyanosis {Numbers; edema:17696} Edema Alert and oriented, grossly normal motor and sensory function Skin Warm and Dry  ECG ***  Estimated Creatinine Clearance: 38.6 mL/min (A) (by C-G formula based on SCr of 1.68 mg/dL (H)).   Assessment and  Plan  Complete heart block  Cardiomyopathy  Nonischemic  Left ventricular hypertrophy  CHF acute/chronic systolic  RU edema  R sided pacemaker  Atrial fibrillation-persistent  Renal insufficiency       Current medicines are reviewed at length with the patient today .  The patient does not*** have concerns regarding medicines.

## 2019-11-12 NOTE — Telephone Encounter (Signed)
I sent a message through My chart. It seems like he has an appt tomorrow at his cardiologist's office. Thanks, BJ

## 2019-11-12 NOTE — Telephone Encounter (Signed)
Daughter Charles Mcmahon, is requesting to speak to someone regarding father's medication Metolazone(ZAROXOLYN) 2.5 MG tablet. Per Charles Mcmahon, the medication is making him dizzy and cannot be left alone. He takes this medication 2 times a week on Monday and Thursday.   [name]'s number [phone]

## 2019-11-12 NOTE — Telephone Encounter (Signed)
Spoke with Lattie Haw. She saw mychart message, Rx is actually prescribed by Cardiology. Advised pt has an appointment tomorrow afternoon with them. Lattie Haw verbalized understanding.

## 2019-11-13 ENCOUNTER — Ambulatory Visit (HOSPITAL_COMMUNITY)
Admission: RE | Admit: 2019-11-13 | Discharge: 2019-11-13 | Disposition: A | Discharge status: Home / Self Care | Payer: Medicare Other | Source: Ambulatory Visit | Attending: Cardiology | Admitting: Cardiology

## 2019-11-13 ENCOUNTER — Other Ambulatory Visit: Payer: Self-pay

## 2019-11-13 ENCOUNTER — Encounter (HOSPITAL_COMMUNITY): Payer: Self-pay

## 2019-11-13 VITALS — BP 110/70 | HR 84 | Wt 166.2 lb

## 2019-11-13 DIAGNOSIS — I5022 Chronic systolic (congestive) heart failure: Secondary | ICD-10-CM | POA: Diagnosis present

## 2019-11-13 DIAGNOSIS — Z85820 Personal history of malignant melanoma of skin: Secondary | ICD-10-CM | POA: Insufficient documentation

## 2019-11-13 DIAGNOSIS — Z87891 Personal history of nicotine dependence: Secondary | ICD-10-CM | POA: Insufficient documentation

## 2019-11-13 DIAGNOSIS — E8582 Wild-type transthyretin-related (ATTR) amyloidosis: Secondary | ICD-10-CM | POA: Diagnosis not present

## 2019-11-13 DIAGNOSIS — N183 Chronic kidney disease, stage 3 unspecified: Secondary | ICD-10-CM | POA: Diagnosis not present

## 2019-11-13 DIAGNOSIS — Z950 Presence of cardiac pacemaker: Secondary | ICD-10-CM | POA: Diagnosis not present

## 2019-11-13 DIAGNOSIS — Z79899 Other long term (current) drug therapy: Secondary | ICD-10-CM | POA: Insufficient documentation

## 2019-11-13 DIAGNOSIS — M109 Gout, unspecified: Secondary | ICD-10-CM | POA: Diagnosis not present

## 2019-11-13 DIAGNOSIS — Z7901 Long term (current) use of anticoagulants: Secondary | ICD-10-CM | POA: Diagnosis not present

## 2019-11-13 DIAGNOSIS — I4821 Permanent atrial fibrillation: Secondary | ICD-10-CM

## 2019-11-13 DIAGNOSIS — I428 Other cardiomyopathies: Secondary | ICD-10-CM

## 2019-11-13 DIAGNOSIS — Z8249 Family history of ischemic heart disease and other diseases of the circulatory system: Secondary | ICD-10-CM | POA: Insufficient documentation

## 2019-11-13 DIAGNOSIS — I4819 Other persistent atrial fibrillation: Secondary | ICD-10-CM | POA: Diagnosis not present

## 2019-11-13 DIAGNOSIS — I5042 Chronic combined systolic (congestive) and diastolic (congestive) heart failure: Secondary | ICD-10-CM | POA: Diagnosis not present

## 2019-11-13 LAB — BASIC METABOLIC PANEL
Anion gap: 10 (ref 5–15)
BUN: 57 mg/dL — ABNORMAL HIGH (ref 8–23)
CO2: 30 mmol/L (ref 22–32)
Calcium: 8.4 mg/dL — ABNORMAL LOW (ref 8.9–10.3)
Chloride: 95 mmol/L — ABNORMAL LOW (ref 98–111)
Creatinine, Ser: 2.06 mg/dL — ABNORMAL HIGH (ref 0.61–1.24)
GFR calc Af Amer: 36 mL/min — ABNORMAL LOW (ref >60)
GFR calc non Af Amer: 31 mL/min — ABNORMAL LOW (ref >60)
Glucose, Bld: 90 mg/dL (ref 70–99)
Potassium: 3.4 mmol/L — ABNORMAL LOW (ref 3.5–5.1)
Sodium: 135 mmol/L (ref 135–145)

## 2019-11-13 LAB — BRAIN NATRIURETIC PEPTIDE: B Natriuretic Peptide: 943 pg/mL — ABNORMAL HIGH (ref 0.0–100.0)

## 2019-11-13 NOTE — Patient Instructions (Signed)
STOP Metolazone  Labs today We will only contact you if something comes back abnormal or we need to make some changes. Otherwise no news is good news!  Your physician recommends that you schedule a follow-up appointment in: 3 weeks with Dr Aundra Dubin  Do the following things EVERYDAY: 1) Weigh yourself in the morning before breakfast. Write it down and keep it in a log. 2) Take your medicines as prescribed 3) Eat low salt foods--Limit salt (sodium) to 2000 mg per day.  4) Stay as active as you can everyday 5) Limit all fluids for the day to less than 2 liters  At the [facility], you and your health needs are our priority. As part of our continuing mission to provide you with exceptional heart care, we have created designated Provider Care Teams. These Care Teams include your primary Cardiologist (physician) and Advanced Practice Providers (APPs- Physician Assistants and Nurse Practitioners) who all work together to provide you with the care you need, when you need it.   You may see any of the following providers on your designated Care Team at your next follow up: Marland Kitchen Dr Glori Bickers . Dr Loralie Champagne . Darrick Grinder, NP . Lyda Jester, PA . Audry Riles, PharmD   Please be sure to bring in all your medications bottles to every appointment.

## 2019-11-13 NOTE — Progress Notes (Signed)
[facility] genetic testing complete TTR-test code [phone] Blood specimen sent via fed ex ([id number] number #[id number]) Dr Aundra Dubin

## 2019-11-13 NOTE — Progress Notes (Signed)
PCP: [name], [name], MD HF Cardiology: Dr. Aundra Dubin EP: Dr. Caryl Comes  74 y.o. with history of complete heart block s/p pacemaker, persistent atrial fibrillation, and chronic systolic CHF was referred by Dr. Caryl Comes for evaluation of HF.  Patient is originally from [place].  In 2010, he developed complete heart block (given geographic location, would question whether this could be Lyme carditis-related) and had PPM placed.  LV EF was noted to fall over time, thought to be due to RV pacing.  Cath in 4/14 in setting of EF 35% showed no significant coronary disease.  In 12/18, his device was upgraded to [facility] CRT-P (declined ICD), but EF remained low.  Last echo in 11/20 showed EF 20-25%, severe LVH (speckled myocardium), severely decreased RV systolic function. He also has persistent atrial fibrillation (most of the time in atrial fibrillation though NSR noted on at least 1 occasion).    He had RHC in 12/20 showed preserved cardiac output but elevated right and left heart filling pressures.   Today he returns for HF follow up. Last visit he was instructed to take metolazone twice a week. Since that time he has felt awful on the days he takes metolazone. Says it takes him a few days to recovery.Overall feeling fair. SOB with steps. Uses electric cart in the grocery store. Denies PND/Orthopnea. Denies syncope/presyncope. Says he has chronic leg edema. Appetite fair. No fever or chills. No bleeding issues.  He has not been weighing at home. . Taking all medications.  Labs (11/20): K 3.6, creatinine 1.77 Labs (12/20): K 2.5 => 3.5, creatinine 1.87 => 1.67, myeloma panel negative  PMH: 1. Complete heart block: 2010, ??Lyme carditis. Received PPM.  2. Gout  3. Atrial fibrillation: Paroxysmal.  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  5. Chronic systolic CHF: Nonischemic cardiomyopathy.  Initially, this was thought to be due to chronic RV pacing.  - Cath in 4/14: EF 35%, no significant CAD.  - St Jude CRT-P  upgrade in 6/18 (he declined ICD).  -  Echo (11/20): EF 20-25%, severe LVH with speckled myocardium, mildly dilated RV with severely decreased systolic function, PASP 41 mmHg.  - RHC (12/20): mean RA 14, PA 52/20 mean 31, mean PCWP 25, CI 2.41.  6. H/o melanoma 7. 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   Social History   Socioeconomic History  . Marital status: Married    Spouse name: Not on file  . Number of children: 2  . Years of education: Not on file  . Highest education level: Not on file  Occupational History  . Not on file  Tobacco Use  . Smoking status: Former Research scientist (life sciences)  . Smokeless tobacco: Never Used  Substance and Sexual Activity  . Alcohol use: Yes  . Drug use: Never  . Sexual activity: Not Currently  Other Topics Concern  . Not on file  Social History Narrative  . Not on file   Social Determinants of Health   Financial Resource Strain:   . Difficulty of Paying Living Expenses: Not on file  Food Insecurity:   . Worried About Charity fundraiser in the Last Year: Not on file  . Ran Out of Food in the Last Year: Not on file  Transportation Needs:   . Lack of Transportation (Medical): Not on file  . Lack of Transportation (Non-Medical): Not on file  Physical Activity:   . Days of Exercise per Week: Not on file  .  Minutes of Exercise per Session: Not on file  Stress:   . Feeling of Stress : Not on file  Social Connections:   . Frequency of Communication with Friends and Family: Not on file  . Frequency of Social Gatherings with Friends and Family: Not on file  . Attends Religious Services: Not on file  . Active Member of Clubs or Organizations: Not on file  . Attends Archivist Meetings: Not on file  . Marital Status: Not on file  Intimate Partner Violence:   . Fear of Current or Ex-Partner: Not on file  . Emotionally Abused: Not on file  . Physically Abused:  Not on file  . Sexually Abused: Not on file   Family history: Grandfather with MI.  No other cardiac disease that he is aware of.   ROS: All systems reviewed and negative except as per HPI.   Current Outpatient Medications  Medication Sig Dispense Refill  . acetaminophen (TYLENOL) 650 MG CR tablet Take 650 mg by mouth every 8 (eight) hours.    Marland Kitchen albuterol (VENTOLIN HFA) 108 (90 Base) MCG/ACT inhaler Inhale 2 puffs into the lungs every 6 (six) hours as needed for wheezing or shortness of breath. 54 g 3  . allopurinol (ZYLOPRIM) 100 MG tablet Take 100 mg by mouth 2 (two) times daily.    Marland Kitchen apixaban (ELIQUIS) 5 MG TABS tablet Take 1 tablet (5 mg total) by mouth 2 (two) times daily. 180 tablet 0  . carvedilol (COREG) 3.125 MG tablet Take 1 tablet (3.125 mg total) by mouth 2 (two) times daily. 180 tablet 3  . finasteride (PROSCAR) 5 MG tablet Take 5 mg by mouth every morning.     . metolazone (ZAROXOLYN) 2.5 MG tablet Take 1 tablet (2.5 mg total) by mouth 2 (two) times a week. Take 1 tab every Monday and Thursday 12 tablet 4  . OVER THE COUNTER MEDICATION Apply 1 application topically 3 (three) times daily as needed (pain). CBD Cream    . OVER THE COUNTER MEDICATION Place 1 drop into both eyes 2 (two) times daily as needed (itching/irritation). Similasin allergy eye relief    . potassium chloride 20 MEQ/15ML (10%) SOLN Take 60 mLs (80 mEq total) by mouth daily. 473 mL 3  . spironolactone (ALDACTONE) 25 MG tablet Take 0.5 tablets (12.5 mg total) by mouth daily. 90 tablet 3  . torsemide (DEMADEX) 20 MG tablet Take 4 tablets (80 mg total) by mouth 2 (two) times daily. 180 tablet 3   No current facility-administered medications for this encounter.   BP 110/70   Pulse 84   Wt 75.4 kg (166 lb 3.2 oz)   SpO2 100%   BMI 24.54 kg/m   Wt Readings from Last 3 Encounters:  11/13/19 75.4 kg (166 lb 3.2 oz)  11/03/19 80.3 kg (177 lb)  10/23/19 81.8 kg (180 lb 6.4 oz)   General:  Appears chronically  ill.  No resp difficulty. Ambulated slowly in the clinic with a cane.  HEENT: normal Neck: supple. JVP 7-8. Carotids 2+ bilat; no bruits. No lymphadenopathy or thryomegaly appreciated. Cor: PMI nondisplaced. Irregular rate & rhythm. No rubs, gallops or murmurs. Lungs: clear Abdomen: soft, nontender, nondistended. No hepatosplenomegaly. No bruits or masses. Good bowel sounds. Extremities: no cyanosis, clubbing, rash, edema Neuro: alert & orientedx3, cranial nerves grossly intact. moves all 4 extremities w/o difficulty. Affect pleasant  Assessment/Plan: 1. Chronic systolic CHF: Nonischemic based on LHC from 2014 in [place].  EF has been low since that  time.  Initially thought to be due to RV pacing, but EF did not improve significantly after St Jude CRT-P upgrade in 6/18 (declined ICD).  Echo in 11/20 with EF 20-25%, severe LVH, severely decreased RV systolic function.  - NYHA III. Weight down 14 pounds since last visit.  - Volume status improved. I am going to stop metolazone for now due to confusion reported when he is taking it.  Continue torsemide 80 mg bid. I have asked him to take afternoon dose of torsemide around 4 to help with frequent urination.  - Continue KCl 60 mEq daily - Continue Coreg 3.125 mg bid, no BP room to increase.  - Losartan recently stopped due to elevated creatinine and low BP.  Would avoid ARB/ACEI/ARNI for now with CKD.  - Continue spironolactone 12.5 mg daily.  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  Myeloma panel was negative, PYP scan Visual and quantitative assessment (grade 2, H/CLL equal 1.8) are strongly suggestive of transthyretin amyloidosis. I discussed this result with him and his daughter.  - Check In vitae Genetic Test - Start tafamidis once patient approval completed.  2. Persistent atrial fibrillation: Most of the time, it appears he is in atrial fibrillation.  Rate controlled.  - Continue Eliquis 5 mg bid.  3. Complete  heart block: Occurred in [place], not related to MI.  ?Lyme carditis (no record of whether this was tested or not, he is not sure).  Lyme carditis could also explain the persistently low EF.  4. CKD: Stage 3.  Follow creatinine closely with diuresis as above. Check BMET   Follow up with Dr Aundra Dubin in 3 weeks.  Greater than 50% of the (total minutes 40) visit spent in counseling/coordination of care regarding the above plan/test.   Charles Waight NP-C  2:54 PM       Charles Tourangeau NP-C  11/13/2019

## 2019-11-14 ENCOUNTER — Encounter: Payer: Self-pay | Admitting: Family Medicine

## 2019-11-15 ENCOUNTER — Encounter: Payer: Self-pay | Admitting: Family Medicine

## 2019-11-17 ENCOUNTER — Emergency Department (HOSPITAL_COMMUNITY): Payer: Medicare Other

## 2019-11-17 ENCOUNTER — Inpatient Hospital Stay (HOSPITAL_COMMUNITY)
Admission: EM | Admit: 2019-11-17 | Discharge: 2019-12-15 | DRG: 193 | Disposition: E | Payer: Medicare Other | Attending: Student | Admitting: Student

## 2019-11-17 ENCOUNTER — Encounter (HOSPITAL_COMMUNITY): Payer: Self-pay

## 2019-11-17 DIAGNOSIS — I2510 Atherosclerotic heart disease of native coronary artery without angina pectoris: Secondary | ICD-10-CM | POA: Diagnosis present

## 2019-11-17 DIAGNOSIS — D631 Anemia in chronic kidney disease: Secondary | ICD-10-CM | POA: Diagnosis present

## 2019-11-17 DIAGNOSIS — M25562 Pain in left knee: Secondary | ICD-10-CM | POA: Diagnosis present

## 2019-11-17 DIAGNOSIS — I951 Orthostatic hypotension: Secondary | ICD-10-CM | POA: Diagnosis present

## 2019-11-17 DIAGNOSIS — I428 Other cardiomyopathies: Secondary | ICD-10-CM | POA: Diagnosis present

## 2019-11-17 DIAGNOSIS — Z87440 Personal history of urinary (tract) infections: Secondary | ICD-10-CM

## 2019-11-17 DIAGNOSIS — Z79899 Other long term (current) drug therapy: Secondary | ICD-10-CM

## 2019-11-17 DIAGNOSIS — M25512 Pain in left shoulder: Secondary | ICD-10-CM | POA: Diagnosis present

## 2019-11-17 DIAGNOSIS — N179 Acute kidney failure, unspecified: Secondary | ICD-10-CM

## 2019-11-17 DIAGNOSIS — R17 Unspecified jaundice: Secondary | ICD-10-CM | POA: Diagnosis present

## 2019-11-17 DIAGNOSIS — R748 Abnormal levels of other serum enzymes: Secondary | ICD-10-CM | POA: Diagnosis present

## 2019-11-17 DIAGNOSIS — Z66 Do not resuscitate: Secondary | ICD-10-CM | POA: Diagnosis present

## 2019-11-17 DIAGNOSIS — I5082 Biventricular heart failure: Secondary | ICD-10-CM | POA: Diagnosis present

## 2019-11-17 DIAGNOSIS — J189 Pneumonia, unspecified organism: Principal | ICD-10-CM

## 2019-11-17 DIAGNOSIS — Z85820 Personal history of malignant melanoma of skin: Secondary | ICD-10-CM

## 2019-11-17 DIAGNOSIS — I119 Hypertensive heart disease without heart failure: Secondary | ICD-10-CM | POA: Diagnosis present

## 2019-11-17 DIAGNOSIS — T796XXA Traumatic ischemia of muscle, initial encounter: Secondary | ICD-10-CM

## 2019-11-17 DIAGNOSIS — E875 Hyperkalemia: Secondary | ICD-10-CM | POA: Diagnosis not present

## 2019-11-17 DIAGNOSIS — I130 Hypertensive heart and chronic kidney disease with heart failure and stage 1 through stage 4 chronic kidney disease, or unspecified chronic kidney disease: Secondary | ICD-10-CM | POA: Diagnosis present

## 2019-11-17 DIAGNOSIS — I872 Venous insufficiency (chronic) (peripheral): Secondary | ICD-10-CM | POA: Diagnosis present

## 2019-11-17 DIAGNOSIS — M6282 Rhabdomyolysis: Secondary | ICD-10-CM | POA: Diagnosis present

## 2019-11-17 DIAGNOSIS — Z96651 Presence of right artificial knee joint: Secondary | ICD-10-CM | POA: Diagnosis present

## 2019-11-17 DIAGNOSIS — W19XXXA Unspecified fall, initial encounter: Secondary | ICD-10-CM

## 2019-11-17 DIAGNOSIS — M79671 Pain in right foot: Secondary | ICD-10-CM | POA: Diagnosis not present

## 2019-11-17 DIAGNOSIS — Z8261 Family history of arthritis: Secondary | ICD-10-CM

## 2019-11-17 DIAGNOSIS — R32 Unspecified urinary incontinence: Secondary | ICD-10-CM | POA: Diagnosis present

## 2019-11-17 DIAGNOSIS — E876 Hypokalemia: Secondary | ICD-10-CM | POA: Diagnosis present

## 2019-11-17 DIAGNOSIS — Z87891 Personal history of nicotine dependence: Secondary | ICD-10-CM

## 2019-11-17 DIAGNOSIS — I442 Atrioventricular block, complete: Secondary | ICD-10-CM | POA: Diagnosis present

## 2019-11-17 DIAGNOSIS — I43 Cardiomyopathy in diseases classified elsewhere: Secondary | ICD-10-CM | POA: Diagnosis present

## 2019-11-17 DIAGNOSIS — Z515 Encounter for palliative care: Secondary | ICD-10-CM | POA: Diagnosis not present

## 2019-11-17 DIAGNOSIS — Z9049 Acquired absence of other specified parts of digestive tract: Secondary | ICD-10-CM

## 2019-11-17 DIAGNOSIS — Z7901 Long term (current) use of anticoagulants: Secondary | ICD-10-CM

## 2019-11-17 DIAGNOSIS — Z950 Presence of cardiac pacemaker: Secondary | ICD-10-CM

## 2019-11-17 DIAGNOSIS — I4821 Permanent atrial fibrillation: Secondary | ICD-10-CM | POA: Diagnosis present

## 2019-11-17 DIAGNOSIS — E854 Organ-limited amyloidosis: Secondary | ICD-10-CM

## 2019-11-17 DIAGNOSIS — N184 Chronic kidney disease, stage 4 (severe): Secondary | ICD-10-CM | POA: Diagnosis present

## 2019-11-17 DIAGNOSIS — Y92009 Unspecified place in unspecified non-institutional (private) residence as the place of occurrence of the external cause: Secondary | ICD-10-CM

## 2019-11-17 DIAGNOSIS — Z20822 Contact with and (suspected) exposure to covid-19: Secondary | ICD-10-CM | POA: Diagnosis present

## 2019-11-17 DIAGNOSIS — R54 Age-related physical debility: Secondary | ICD-10-CM | POA: Diagnosis present

## 2019-11-17 DIAGNOSIS — R55 Syncope and collapse: Secondary | ICD-10-CM

## 2019-11-17 DIAGNOSIS — J45909 Unspecified asthma, uncomplicated: Secondary | ICD-10-CM | POA: Diagnosis present

## 2019-11-17 DIAGNOSIS — I5020 Unspecified systolic (congestive) heart failure: Secondary | ICD-10-CM | POA: Diagnosis present

## 2019-11-17 DIAGNOSIS — Z7189 Other specified counseling: Secondary | ICD-10-CM

## 2019-11-17 DIAGNOSIS — I509 Heart failure, unspecified: Secondary | ICD-10-CM

## 2019-11-17 DIAGNOSIS — I5043 Acute on chronic combined systolic (congestive) and diastolic (congestive) heart failure: Secondary | ICD-10-CM | POA: Diagnosis not present

## 2019-11-17 DIAGNOSIS — G9341 Metabolic encephalopathy: Secondary | ICD-10-CM | POA: Diagnosis present

## 2019-11-17 DIAGNOSIS — M109 Gout, unspecified: Secondary | ICD-10-CM | POA: Diagnosis present

## 2019-11-17 DIAGNOSIS — S40812A Abrasion of left upper arm, initial encounter: Secondary | ICD-10-CM | POA: Diagnosis present

## 2019-11-17 DIAGNOSIS — W01119S Fall on same level from slipping, tripping and stumbling with subsequent striking against unspecified sharp object, sequela: Secondary | ICD-10-CM

## 2019-11-17 LAB — CBC WITH DIFFERENTIAL/PLATELET
Abs Immature Granulocytes: 0.04 10*3/uL (ref 0.00–0.07)
Basophils Absolute: 0 10*3/uL (ref 0.0–0.1)
Basophils Relative: 0 %
Eosinophils Absolute: 0 10*3/uL (ref 0.0–0.5)
Eosinophils Relative: 0 %
HCT: 43.3 % (ref 39.0–52.0)
Hemoglobin: 13.6 g/dL (ref 13.0–17.0)
Immature Granulocytes: 0 %
Lymphocytes Relative: 3 %
Lymphs Abs: 0.3 10*3/uL — ABNORMAL LOW (ref 0.7–4.0)
MCH: 30.9 pg (ref 26.0–34.0)
MCHC: 31.4 g/dL (ref 30.0–36.0)
MCV: 98.4 fL (ref 80.0–100.0)
Monocytes Absolute: 0.7 10*3/uL (ref 0.1–1.0)
Monocytes Relative: 7 %
Neutro Abs: 8.9 10*3/uL — ABNORMAL HIGH (ref 1.7–7.7)
Neutrophils Relative %: 90 %
Platelets: 281 10*3/uL (ref 150–400)
RBC: 4.4 MIL/uL (ref 4.22–5.81)
RDW: 16.8 % — ABNORMAL HIGH (ref 11.5–15.5)
WBC: 10 10*3/uL (ref 4.0–10.5)
nRBC: 0 % (ref 0.0–0.2)

## 2019-11-17 LAB — BASIC METABOLIC PANEL
Anion gap: 15 (ref 5–15)
BUN: 60 mg/dL — ABNORMAL HIGH (ref 8–23)
CO2: 26 mmol/L (ref 22–32)
Calcium: 9.3 mg/dL (ref 8.9–10.3)
Chloride: 99 mmol/L (ref 98–111)
Creatinine, Ser: 2.07 mg/dL — ABNORMAL HIGH (ref 0.61–1.24)
GFR calc Af Amer: 35 mL/min — ABNORMAL LOW (ref >60)
GFR calc non Af Amer: 31 mL/min — ABNORMAL LOW (ref >60)
Glucose, Bld: 113 mg/dL — ABNORMAL HIGH (ref 70–99)
Potassium: 3.3 mmol/L — ABNORMAL LOW (ref 3.5–5.1)
Sodium: 140 mmol/L (ref 135–145)

## 2019-11-17 LAB — RESPIRATORY PANEL BY RT PCR (FLU A&B, COVID)
Influenza A by PCR: NEGATIVE
Influenza B by PCR: NEGATIVE
SARS Coronavirus 2 by RT PCR: NEGATIVE

## 2019-11-17 LAB — CK: Total CK: 2260 U/L — ABNORMAL HIGH (ref 49–397)

## 2019-11-17 MED ORDER — ALLOPURINOL 100 MG PO TABS
100.0000 mg | ORAL_TABLET | Freq: Two times a day (BID) | ORAL | Status: DC
  Administered 2019-11-17 – 2019-11-22 (×11): 100 mg via ORAL
  Filled 2019-11-17 (×13): qty 1

## 2019-11-17 MED ORDER — SODIUM CHLORIDE 0.9 % IV SOLN
1.0000 g | Freq: Once | INTRAVENOUS | Status: AC
  Administered 2019-11-17: 1 g via INTRAVENOUS
  Filled 2019-11-17: qty 10

## 2019-11-17 MED ORDER — ONDANSETRON HCL 4 MG/2ML IJ SOLN: 4.0000 mg | Freq: Four times a day (QID) | INTRAMUSCULAR | Status: DC | PRN

## 2019-11-17 MED ORDER — FENTANYL CITRATE (PF) 100 MCG/2ML IJ SOLN
50.0000 ug | Freq: Once | INTRAMUSCULAR | Status: AC
  Administered 2019-11-17: 50 ug via INTRAVENOUS
  Filled 2019-11-17: qty 2

## 2019-11-17 MED ORDER — ALBUTEROL SULFATE HFA 108 (90 BASE) MCG/ACT IN AERS
2.0000 | INHALATION_SPRAY | Freq: Four times a day (QID) | RESPIRATORY_TRACT | Status: DC | PRN
  Filled 2019-11-17: qty 6.7

## 2019-11-17 MED ORDER — POTASSIUM CHLORIDE CRYS ER 10 MEQ PO TBCR
10.0000 meq | EXTENDED_RELEASE_TABLET | Freq: Once | ORAL | Status: AC
  Administered 2019-11-17: 10 meq via ORAL
  Filled 2019-11-17: qty 1

## 2019-11-17 MED ORDER — APIXABAN 5 MG PO TABS
5.0000 mg | ORAL_TABLET | Freq: Two times a day (BID) | ORAL | Status: DC
  Administered 2019-11-17 – 2019-11-22 (×10): 5 mg via ORAL
  Filled 2019-11-17 (×11): qty 1

## 2019-11-17 MED ORDER — HYDROCODONE-ACETAMINOPHEN 5-325 MG PO TABS
1.0000 | ORAL_TABLET | Freq: Once | ORAL | Status: AC
  Administered 2019-11-17: 1 via ORAL
  Filled 2019-11-17: qty 1

## 2019-11-17 MED ORDER — SODIUM CHLORIDE 0.9 % IV SOLN
1.0000 g | INTRAVENOUS | Status: DC
  Administered 2019-11-18 – 2019-11-21 (×4): 1 g via INTRAVENOUS
  Filled 2019-11-17: qty 10
  Filled 2019-11-17: qty 1
  Filled 2019-11-17: qty 10
  Filled 2019-11-17 (×2): qty 1
  Filled 2019-11-17: qty 10

## 2019-11-17 MED ORDER — ONDANSETRON HCL 4 MG PO TABS: 4.0000 mg | ORAL_TABLET | Freq: Four times a day (QID) | ORAL | Status: DC | PRN

## 2019-11-17 MED ORDER — ACETAMINOPHEN 325 MG PO TABS
650.0000 mg | ORAL_TABLET | Freq: Four times a day (QID) | ORAL | Status: DC | PRN
  Administered 2019-11-17 – 2019-11-18 (×2): 650 mg via ORAL
  Filled 2019-11-17 (×2): qty 2

## 2019-11-17 MED ORDER — FINASTERIDE 5 MG PO TABS
5.0000 mg | ORAL_TABLET | Freq: Every morning | ORAL | Status: DC
  Administered 2019-11-18 – 2019-11-22 (×5): 5 mg via ORAL
  Filled 2019-11-17 (×5): qty 1

## 2019-11-17 MED ORDER — SODIUM CHLORIDE 0.9 % IV SOLN
500.0000 mg | INTRAVENOUS | Status: DC
  Administered 2019-11-18 – 2019-11-19 (×2): 500 mg via INTRAVENOUS
  Filled 2019-11-17 (×2): qty 500

## 2019-11-17 MED ORDER — ACETAMINOPHEN 650 MG RE SUPP: 650.0000 mg | Freq: Four times a day (QID) | RECTAL | Status: DC | PRN

## 2019-11-17 MED ORDER — SODIUM CHLORIDE 0.9 % IV BOLUS
1000.0000 mL | Freq: Once | INTRAVENOUS | Status: AC
  Administered 2019-11-17: 1000 mL via INTRAVENOUS

## 2019-11-17 MED ORDER — SODIUM CHLORIDE 0.9 % IV SOLN
500.0000 mg | Freq: Once | INTRAVENOUS | Status: AC
  Administered 2019-11-17: 500 mg via INTRAVENOUS
  Filled 2019-11-17: qty 500

## 2019-11-17 MED ORDER — ALBUTEROL SULFATE (2.5 MG/3ML) 0.083% IN NEBU: 2.5000 mg | INHALATION_SOLUTION | RESPIRATORY_TRACT | Status: DC | PRN

## 2019-11-17 MED ORDER — TRAMADOL HCL 50 MG PO TABS
50.0000 mg | ORAL_TABLET | Freq: Four times a day (QID) | ORAL | Status: DC | PRN
  Administered 2019-11-17 – 2019-11-21 (×8): 50 mg via ORAL
  Filled 2019-11-17 (×8): qty 1

## 2019-11-17 MED ORDER — FENTANYL CITRATE (PF) 100 MCG/2ML IJ SOLN
12.5000 ug | Freq: Once | INTRAMUSCULAR | Status: AC
  Administered 2019-11-17: 12.5 ug via INTRAVENOUS
  Filled 2019-11-17: qty 2

## 2019-11-17 MED ORDER — SENNOSIDES-DOCUSATE SODIUM 8.6-50 MG PO TABS: 1.0000 | ORAL_TABLET | Freq: Every evening | ORAL | Status: DC | PRN

## 2019-11-17 MED ORDER — SODIUM CHLORIDE 0.9 % IV SOLN: INTRAVENOUS | Status: AC

## 2019-11-17 NOTE — ED Notes (Signed)
Pt transported to 2W11C via cart by RN. Pt conscious, breathing, and A&Ox4. No distress noted. All belongings with pt.

## 2019-11-17 NOTE — ED Notes (Signed)
[phone] - daughter lisa call for update

## 2019-11-17 NOTE — H&P (Signed)
History and Physical  Charles Mcmahon BWG:[id number] DOB: 10/14/1946 DOA: 11/29/2019  PCP: [name], [name], MD Patient coming from: Home  I have personally briefly reviewed patient's old medical records in [place]   Chief Complaint: fall  HPI: Charles Mcmahon is a 74 y.o. male past medical history significant for complete heart block status post pacemaker, persistent A. fib, chronic systolic heart failure ejection fraction 25%.,  Chronic kidney disease a stage IV, who presents after a fall and patient was down for 9 hours on the floor.  Patient does not remember how he ended on the floor.  Denies any chest pain shortness of breath currently.  He lie down on his back, he could not get up.  He denies hitting his head.  He denies any significant cough, he report mild cough at times.  He denies fever, runny nose. Complaining of muscle cramps in his right eye after the fall.  Patient's medication has been adjusted recently.  His metolazone was discontinued.  Since he was started on torsemide patient's daughter  noticed that he has been more confused.  He usually get more confused after he takes torsemide and last for 6 to 10 hours.  She is concerned with disposition at the time of discharge, if he is continue on torsemide. He might get confuse and could fall again.   Evaluation in the ED: Sodium 140, potassium 3.3, BUN 60, creatinine 2.0, total CK 2260, BNP 943, white blood cell 10, platelets 281, x-ray:Negative for fracture. Small calcifications at the distal rotator cuff which could reflect calcific tendinitis.  Chest x-ray:New abnormal density in the right middle lobe which could represent an infiltrate or mass.  CT chest:Dense consolidation medial segment right middle lobe, with more patchy consolidation in the bilateral lower lobes. Multifocal bronchopneumonia suspected. Bilateral pleural effusions volume estimated less than 1 L each. Trace pericardial effusion.  Hip x-ray:  Negative.  Review of Systems: All systems reviewed and apart from history of presenting illness, are negative.  Past Medical History:  Diagnosis Date  . Allergy   . Arthritis   . Asthma   . Cancer ([place])   . Cardiac arrhythmia due to congenital heart disease   . Coronary artery disease   . Heart disease   . Heart disease   . Heart murmur   . Hx: UTI (urinary tract infection)    Past Surgical History:  Procedure Laterality Date  . CHOLECYSTECTOMY  2008  . HERNIA REPAIR  2019   right side anguler  . MELANOMA EXCISION Right 1993   right arm  . PACEMAKER IMPLANT    . REPLACEMENT TOTAL KNEE Right   . RIGHT HEART CATH N/A 10/15/2019   Procedure: RIGHT HEART CATH;  Surgeon: Larey Dresser, MD;  Location: [facility];  Service: Cardiovascular;  Laterality: N/A;   Social History:  reports that he has quit smoking. He has never used smokeless tobacco. He reports current alcohol use. He reports that he does not use drugs.   No Known Allergies  Family History  Problem Relation Age of Onset  . Arthritis Mother      Prior to Admission medications   Medication Sig Start Date End Date Taking? Authorizing Provider  acetaminophen (TYLENOL) 650 MG CR tablet Take 1,300 mg by mouth daily.    Yes [provider]  albuterol (VENTOLIN HFA) 108 (90 Base) MCG/ACT inhaler Inhale 2 puffs into the lungs every 6 (six) hours as needed for wheezing or  shortness of breath. 09/26/19  Yes [name], [name], MD  allopurinol (ZYLOPRIM) 100 MG tablet Take 100 mg by mouth 2 (two) times daily.   Yes [provider]  apixaban (ELIQUIS) 5 MG TABS tablet Take 1 tablet (5 mg total) by mouth 2 (two) times daily. 10/15/19  Yes Larey Dresser, MD  carvedilol (COREG) 3.125 MG tablet Take 1 tablet (3.125 mg total) by mouth 2 (two) times daily. 10/20/19  Yes Buford Dresser, MD  finasteride (PROSCAR) 5 MG tablet Take 5 mg by mouth every morning.    Yes [provider]  OVER THE  COUNTER MEDICATION Apply 1 application topically 3 (three) times daily as needed (pain). CBD Cream   Yes [provider]  potassium chloride 20 MEQ/15ML (10%) SOLN Take 60 mLs (80 mEq total) by mouth daily. 11/06/19  Yes Larey Dresser, MD  spironolactone (ALDACTONE) 25 MG tablet Take 0.5 tablets (12.5 mg total) by mouth daily. 10/23/19 01/21/20 Yes Larey Dresser, MD  torsemide (DEMADEX) 20 MG tablet Take 4 tablets (80 mg total) by mouth 2 (two) times daily. 10/15/19  Yes Larey Dresser, MD   Physical Exam: Vitals:   11/27/2019 1415 11/24/2019 1445 11/26/2019 1500 12/11/2019 1615  BP: (!) 100/59 98/67 (!) 106/54 104/70  Pulse: 76 78 68 74  Resp: 18     Temp:      TempSrc:      SpO2: 95% 91% 99% 100%     General exam: Moderately built and nourished patient, lying comfortably supine on the gurney in no obvious distress.  Head, eyes and ENT: Nontraumatic and normocephalic. Pupils equally reacting to light and accommodation. Oral mucosa moist.  Neck: Supple. No JVD, carotid bruit or thyromegaly.  Lymphatics: No lymphadenopathy.  Respiratory system: Clear to auscultation. No increased work of breathing.  Cardiovascular system: S1 and S2 heard, RRR. No JVD. Trace edema  Gastrointestinal system: Abdomen is nondistended, soft and nontender. Normal bowel sounds heard. No organomegaly or masses appreciated.  Central nervous system: Alert and oriented. No focal neurological deficits.  Extremities: Symmetric 5 x 5 power. Peripheral pulses symmetrically felt.   Skin: abrasion skin, knees  Musculoskeletal system: Negative exam.  Psychiatry: Pleasant and cooperative.   Labs on Admission:  Basic Metabolic Panel: Recent Labs  Lab 11/13/19 1445 12/10/2019 1029  NA 135 140  K 3.4* 3.3*  CL 95* 99  CO2 30 26  GLUCOSE 90 113*  BUN 57* 60*  CREATININE 2.06* 2.07*  CALCIUM 8.4* 9.3   Liver Function Tests: No results for input(s): AST, ALT, ALKPHOS, BILITOT, PROT, ALBUMIN in the  last 168 hours. No results for input(s): LIPASE, AMYLASE in the last 168 hours. No results for input(s): AMMONIA in the last 168 hours. CBC: Recent Labs  Lab 12/10/2019 1029  WBC 10.0  NEUTROABS 8.9*  HGB 13.6  HCT 43.3  MCV 98.4  PLT 281   Cardiac Enzymes: Recent Labs  Lab 11/20/2019 1029  CKTOTAL 2,260*    BNP (last 3 results) Recent Labs    05/27/19 1318 06/17/19 0938  PROBNP 755.0* 661.0*   CBG: No results for input(s): GLUCAP in the last 168 hours.  Radiological Exams on Admission: DG Chest 1 View  Result Date: 11/19/2019 CLINICAL DATA:  Pain secondary to a fall due to syncope. EXAM: CHEST  1 VIEW COMPARISON:  04/26/2019 FINDINGS: There is a new abnormal density in the right middle lobe adjacent to the right heart border which could represent an infiltrate or mass. There  is new slight atelectasis at the left base posterior medially. Heart size and pulmonary vascularity are normal. Aortic atherosclerosis. Pacemaker in place. No discrete effusions or acute bone abnormalities. IMPRESSION: New abnormal density in the right middle lobe which could represent an infiltrate or mass. CT scan of the chest with contrast is recommended for further evaluation if the patient does not have findings consistent with pneumonia. Aortic Atherosclerosis (ICD10-I70.0). Electronically Signed   By: Lorriane Shire M.D.   On: 12/04/2019 10:58   CT Head Wo Contrast  Result Date: 12/03/2019 CLINICAL DATA:  Trauma to the head and neck. EXAM: CT HEAD WITHOUT CONTRAST CT CERVICAL SPINE WITHOUT CONTRAST TECHNIQUE: Multidetector CT imaging of the head and cervical spine was performed following the standard protocol without intravenous contrast. Multiplanar CT image reconstructions of the cervical spine were also generated. COMPARISON:  09/08/2019 FINDINGS: CT HEAD FINDINGS Brain: Age related volume loss. No evidence of old or acute focal infarction, mass lesion, hemorrhage, hydrocephalus or extra-axial  collection. Vascular: No abnormal vascular finding. Skull: No skull fracture. Sinuses/Orbits: Previous functional endoscopic sinus surgery. Other: None CT CERVICAL SPINE FINDINGS Alignment: No traumatic malalignment. 2 mm degenerative anterolisthesis C4-5. Skull base and vertebrae: Benign sclerotic focus within the posterior left C5 vertebral body. No significant primary bone lesion. No evidence of regional fracture. Soft tissues and spinal canal: No traumatic finding. Disc levels: Pronounced facet arthropathy on the right at C7-T1. Pronounced facet arthropathy on the left at C6-7. Lesser facet osteoarthritis on the left at C3-4, C4-5, C5-6 and C7-T1. No evidence of compressive canal stenosis. Foraminal narrowing on the left that could be symptomatic. Upper chest: Negative Other: None IMPRESSION: Head CT: No acute or traumatic finding.  Age related atrophy. Cervical spine CT: No acute or traumatic finding. Extensive chronic facet arthropathy left more than right as outlined above. Electronically Signed   By: Nelson Chimes M.D.   On: 12/10/2019 11:12   CT Chest Wo Contrast  Result Date: 11/19/2019 CLINICAL DATA:  History of fall, anticoagulated EXAM: CT CHEST WITHOUT CONTRAST TECHNIQUE: Multidetector CT imaging of the chest was performed following the standard protocol without IV contrast. COMPARISON:  12/03/2019 FINDINGS: Cardiovascular: Evaluation of the heart and great vessels is limited without IV contrast in the setting of trauma. There is a small pericardial effusion. Multi lead pacemaker is identified. Mild atherosclerosis of the LAD distribution of the coronary vessels. No pathologic adenopathy. Mediastinum/Nodes: No pathologic adenopathy. Lungs/Pleura: There are bilateral pleural effusions, volume estimated less than 1 L each. Areas of compressive atelectasis are seen within the bilateral lower lobes. Dense consolidation within the medial segment right middle lobe, as well as patchy consolidation within  the bilateral lower lobes, could reflect multifocal bronchopneumonia. No pneumothorax. Upper Abdomen: Trace ascites within the upper abdomen. Nodularity of the liver capsule suggests cirrhosis. Otherwise no acute process. Musculoskeletal: There are no acute displaced fractures. Reconstructed images demonstrate evidence of a prior healed mid sternal gladiolus fracture. IMPRESSION: 1. Dense consolidation medial segment right middle lobe, with more patchy consolidation in the bilateral lower lobes. Multifocal bronchopneumonia suspected. 2. Bilateral pleural effusions volume estimated less than 1 L each. 3. Trace pericardial effusion. 4. Trace ascites. 5. Capsular liver nodularity suggesting cirrhosis. Electronically Signed   By: Randa Ngo M.D.   On: 11/21/2019 15:38   CT Cervical Spine Wo Contrast  Result Date: 12/07/2019 CLINICAL DATA:  Trauma to the head and neck. EXAM: CT HEAD WITHOUT CONTRAST CT CERVICAL SPINE WITHOUT CONTRAST TECHNIQUE: Multidetector CT imaging of the head  and cervical spine was performed following the standard protocol without intravenous contrast. Multiplanar CT image reconstructions of the cervical spine were also generated. COMPARISON:  09/08/2019 FINDINGS: CT HEAD FINDINGS Brain: Age related volume loss. No evidence of old or acute focal infarction, mass lesion, hemorrhage, hydrocephalus or extra-axial collection. Vascular: No abnormal vascular finding. Skull: No skull fracture. Sinuses/Orbits: Previous functional endoscopic sinus surgery. Other: None CT CERVICAL SPINE FINDINGS Alignment: No traumatic malalignment. 2 mm degenerative anterolisthesis C4-5. Skull base and vertebrae: Benign sclerotic focus within the posterior left C5 vertebral body. No significant primary bone lesion. No evidence of regional fracture. Soft tissues and spinal canal: No traumatic finding. Disc levels: Pronounced facet arthropathy on the right at C7-T1. Pronounced facet arthropathy on the left at C6-7.  Lesser facet osteoarthritis on the left at C3-4, C4-5, C5-6 and C7-T1. No evidence of compressive canal stenosis. Foraminal narrowing on the left that could be symptomatic. Upper chest: Negative Other: None IMPRESSION: Head CT: No acute or traumatic finding.  Age related atrophy. Cervical spine CT: No acute or traumatic finding. Extensive chronic facet arthropathy left more than right as outlined above. Electronically Signed   By: Nelson Chimes M.D.   On: 12/08/2019 11:12   DG Shoulder Left  Result Date: 11/28/2019 CLINICAL DATA:  Syncope with fall.  Left shoulder pain EXAM: LEFT SHOULDER - 2+ VIEW COMPARISON:  None. FINDINGS: No acute fracture or dislocation. Chronic calcification or ossicle about the spurred acromioclavicular joint, favor the latter. Minimal calcification at the greater tuberosity of the humerus, chronic appearing. Prominent degenerative facet spurring in the cervical spine. IMPRESSION: 1. Negative for fracture. 2. Small calcifications at the distal rotator cuff which could reflect calcific tendinitis. Electronically Signed   By: Monte Fantasia M.D.   On: 12/09/2019 10:56   DG Hip Unilat With Pelvis 2-3 Views Right  Result Date: 12/11/2019 CLINICAL DATA:  Right hip pain after fall. EXAM: DG HIP (WITH OR WITHOUT PELVIS) 2-3V RIGHT COMPARISON:  None. FINDINGS: There is no evidence of hip fracture or dislocation. There is no evidence of arthropathy or other focal bone abnormality. IMPRESSION: Negative. Electronically Signed   By: Marijo Conception M.D.   On: 11/26/2019 10:59    EKG: Independently reviewed. A fib pace.   Assessment/Plan Active Problems:   Hypertension with heart disease   Permanent atrial fibrillation (HCC)   Chronic heart failure (HCC)   Heart block AV complete (HCC)   CKD (chronic kidney disease) stage 4, GFR 15-29 ml/min (HCC)   HFrEF (heart failure with reduced ejection fraction) ([place])   Rhabdomyolysis   Fall at home, initial encounter    Pneumonia  1-Multifocal pneumonia: This could be related to aspiration event versus community-acquired pneumonia. Continue with IV ceftriaxone and azithromycin. Follow Covid test.  As needed nebulizer.  2-Fall: Unclear if patient had a syncope episode.  Monitor on telemetry. PT and OT evaluation. -Could have be related to hypotension.  Patient systolic blood pressure running soft.   3-Mild Rhabdomyolysis; secondary to immobilization no recent fall. Gentle hydration. Repeat CK level in the morning.  4-Chronic kidney disease a stage IV: Creatinine at baseline.  Monitor renal function.  5-Chronic systolic heart failure: Appear compensated. Chest x-ray showed bilateral pleural effusion.  Will need to resume diuretics when CK level trends down. Heart failure team will see patient in consultation in the morning. We will hold diuretics, carvedilol.  Systolic blood pressure soft. Will ask cardiology to comment on torsemide causing confusion. -  6-Confusion: This could be  related to infection as well.  Will check TSH, B 12.   DVT Prophylaxis: Eliquis Code Status: DNR, per patient wishes, daughter aware. Family Communication: Care discussed with patient's daughter Lattie Haw who is the primary contact. Disposition Plan: Admit to the hospital for treatment of pneumonia, fall, early rhabdo.  Time spent: 75 minutes  Elmarie Shiley MD [facility]   12/03/2019, 5:06 PM

## 2019-11-17 NOTE — ED Provider Notes (Signed)
[facility] EMERGENCY DEPARTMENT Provider Note   CSN: [id number] Arrival date & time:        History No chief complaint on file. CC Fall  Charles Mcmahon is a 74 y.o. male.  He has a history of A. fib, coronary disease and is on anticoagulation.  He said he fell yesterday and was unable to get off the floor.  Unclear if this was syncope or not.  Today he was found by family on the floor.  He is complaining of pain in the left side of his neck left shoulder and right groin.  Worse with movement.  Has tried nothing for it.  Denies any headache chest pain abdominal pain.  The history is provided by the patient and the EMS personnel.  Fall This is a new problem. The current episode started yesterday. The problem occurs rarely. The problem has not changed since onset.Pertinent negatives include no chest pain, no abdominal pain, no headaches and no shortness of breath. The symptoms are aggravated by bending and twisting. Nothing relieves the symptoms. He has tried nothing for the symptoms. The treatment provided no relief.       Past Medical History:  Diagnosis Date  . Allergy   . Arthritis   . Asthma   . Cancer ([place])   . Cardiac arrhythmia due to congenital heart disease   . Coronary artery disease   . Heart disease   . Heart disease   . Heart murmur   . Hx: UTI (urinary tract infection)     Patient Active Problem List   Diagnosis Date Noted  . Abnormal LFTs (liver function tests) 11/03/2019  . TIA (transient ischemic attack) 11/03/2019  . HFrEF (heart failure with reduced ejection fraction) ([place]) 11/03/2019  . Acute encephalopathy 09/07/2019  . Chronic heart failure ([place]) 08/07/2019  . 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 Heart block AV complete ([place]) 08/07/2019  . 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 Nonischemic cardiomyopathy ([place]) 08/07/2019  . CKD (chronic kidney disease) stage 4, GFR 15-29 ml/min ([facility])  08/07/2019  . Hypertension with heart disease 06/17/2019  . Elevated alkaline phosphatase level 06/17/2019  . Kidney disease 06/17/2019  . Permanent atrial fibrillation ([place]) 06/17/2019    Past Surgical History:  Procedure Laterality Date  . CHOLECYSTECTOMY  2008  . HERNIA REPAIR  2019   right side anguler  . MELANOMA EXCISION Right 1993   right arm  . PACEMAKER IMPLANT    . REPLACEMENT TOTAL KNEE Right   . RIGHT HEART CATH N/A 10/15/2019   Procedure: RIGHT HEART CATH;  Surgeon: Larey Dresser, MD;  Location: [facility];  Service: Cardiovascular;  Laterality: N/A;       Family History  Problem Relation Age of Onset  . Arthritis Mother     Social History   Tobacco Use  . Smoking status: Former Research scientist (life sciences)  . Smokeless tobacco: Never Used  Substance Use Topics  . Alcohol use: Yes  . Drug use: Never    Home Medications Prior to Admission medications   Medication Sig Start Date End Date Taking? Authorizing Provider  acetaminophen (TYLENOL) 650 MG CR tablet Take 650 mg by mouth every 8 (eight) hours.    [provider]  albuterol (VENTOLIN HFA) 108 (90 Base) MCG/ACT inhaler Inhale 2 puffs into the lungs every 6 (six) hours as needed for wheezing or shortness of breath. 09/26/19   [name], [name], MD  allopurinol (ZYLOPRIM) 100 MG  tablet Take 100 mg by mouth 2 (two) times daily.    [provider]  apixaban (ELIQUIS) 5 MG TABS tablet Take 1 tablet (5 mg total) by mouth 2 (two) times daily. 10/15/19   Larey Dresser, MD  carvedilol (COREG) 3.125 MG tablet Take 1 tablet (3.125 mg total) by mouth 2 (two) times daily. 10/20/19   Buford Dresser, MD  finasteride (PROSCAR) 5 MG tablet Take 5 mg by mouth every morning.     [provider]  OVER THE COUNTER MEDICATION Apply 1 application topically 3 (three) times daily as needed (pain). CBD Cream    [provider]  OVER THE COUNTER MEDICATION Place 1 drop into both eyes 2 (two) times  daily as needed (itching/irritation). Similasin allergy eye relief    [provider]  potassium chloride 20 MEQ/15ML (10%) SOLN Take 60 mLs (80 mEq total) by mouth daily. 11/06/19   Larey Dresser, MD  spironolactone (ALDACTONE) 25 MG tablet Take 0.5 tablets (12.5 mg total) by mouth daily. 10/23/19 01/21/20  Larey Dresser, MD  torsemide (DEMADEX) 20 MG tablet Take 4 tablets (80 mg total) by mouth 2 (two) times daily. 10/15/19   Larey Dresser, MD    Allergies    Patient has no known allergies.  Review of Systems   Review of Systems  Constitutional: Negative for fever.  HENT: Negative for sore throat.   Eyes: Negative for visual disturbance.  Respiratory: Negative for shortness of breath.   Cardiovascular: Negative for chest pain.  Gastrointestinal: Negative for abdominal pain.  Genitourinary: Negative for dysuria.  Musculoskeletal: Positive for back pain and neck pain.  Skin: Positive for wound. Negative for rash.  Neurological: Negative for headaches.    Physical Exam Updated Vital Signs BP 104/73   Pulse 74   Temp (!) 97.2 F (36.2 C) (Oral)   Resp 12   SpO2 96%   Physical Exam Vitals and nursing note reviewed.  Constitutional:      Appearance: He is well-developed.  HENT:     Head: Normocephalic and atraumatic.  Eyes:     Conjunctiva/sclera: Conjunctivae normal.  Cardiovascular:     Rate and Rhythm: Normal rate and regular rhythm.     Heart sounds: No murmur.  Pulmonary:     Effort: Pulmonary effort is normal. No respiratory distress.     Breath sounds: Normal breath sounds.  Abdominal:     Palpations: Abdomen is soft.     Tenderness: There is no abdominal tenderness.  Musculoskeletal:        General: Tenderness present.     Cervical back: Neck supple.     Comments: Patient is in a c-collar.  No midline cervical tenderness.  He is tender diffusely around his left shoulder although has normal internal and external rotation.  Also tender right hip  worse with any, movement.  No obvious shortening or deformity.  Bilateral lower extremity edema.  Range of motion of left lower extremity without any pain or limitations.  Range of motion of right upper extremity without any pain or limitation.  He has had some abrasions over the dorsum of his left hand.  Skin:    General: Skin is warm and dry.     Capillary Refill: Capillary refill takes less than 2 seconds.  Neurological:     General: No focal deficit present.     Mental Status: He is alert. Mental status is at baseline.     ED Results / Procedures / Treatments  Labs (all labs ordered are listed, but only abnormal results are displayed) Labs Reviewed  BASIC METABOLIC PANEL - Abnormal; Notable for the following components:      Result Value   Potassium 3.3 (*)    Glucose, Bld 113 (*)    BUN 60 (*)    Creatinine, Ser 2.07 (*)    GFR calc non Af Amer 31 (*)    GFR calc Af Amer 35 (*)    All other components within normal limits  CBC WITH DIFFERENTIAL/PLATELET - Abnormal; Notable for the following components:   RDW 16.8 (*)    Neutro Abs 8.9 (*)    Lymphs Abs 0.3 (*)    All other components within normal limits  CK - Abnormal; Notable for the following components:   Total CK 2,260 (*)    All other components within normal limits  RESPIRATORY PANEL BY RT PCR (FLU A&B, COVID)  CK  VITAMIN B12  TSH    EKG EKG Interpretation  Date/Time:  Monday November 17 2019 16:24:01 EST Ventricular Rate:  77 PR Interval:    QRS Duration: 175 QT Interval:  460 QTC Calculation: 521 R Axis:   -90 Text Interpretation: Afib/flut and V-paced complexes No further rhythm analysis attempted due to paced rhythm IVCD, consider atypical RBBB Inferior infarct, old Abnormal lateral Q waves Probable anterior infarct, age indeterminate Confirmed by Fredia Sorrow [phone]) on 11/29/2019 4:37:03 PM   Radiology DG Chest 1 View  Result Date: 12/02/2019 CLINICAL DATA:  Pain secondary to a fall due to  syncope. EXAM: CHEST  1 VIEW COMPARISON:  04/26/2019 FINDINGS: There is a new abnormal density in the right middle lobe adjacent to the right heart border which could represent an infiltrate or mass. There is new slight atelectasis at the left base posterior medially. Heart size and pulmonary vascularity are normal. Aortic atherosclerosis. Pacemaker in place. No discrete effusions or acute bone abnormalities. IMPRESSION: New abnormal density in the right middle lobe which could represent an infiltrate or mass. CT scan of the chest with contrast is recommended for further evaluation if the patient does not have findings consistent with pneumonia. Aortic Atherosclerosis (ICD10-I70.0). Electronically Signed   By: Lorriane Shire M.D.   On: 11/19/2019 10:58   CT Head Wo Contrast  Result Date: 12/02/2019 CLINICAL DATA:  Trauma to the head and neck. EXAM: CT HEAD WITHOUT CONTRAST CT CERVICAL SPINE WITHOUT CONTRAST TECHNIQUE: Multidetector CT imaging of the head and cervical spine was performed following the standard protocol without intravenous contrast. Multiplanar CT image reconstructions of the cervical spine were also generated. COMPARISON:  09/08/2019 FINDINGS: CT HEAD FINDINGS Brain: Age related volume loss. No evidence of old or acute focal infarction, mass lesion, hemorrhage, hydrocephalus or extra-axial collection. Vascular: No abnormal vascular finding. Skull: No skull fracture. Sinuses/Orbits: Previous functional endoscopic sinus surgery. Other: None CT CERVICAL SPINE FINDINGS Alignment: No traumatic malalignment. 2 mm degenerative anterolisthesis C4-5. Skull base and vertebrae: Benign sclerotic focus within the posterior left C5 vertebral body. No significant primary bone lesion. No evidence of regional fracture. Soft tissues and spinal canal: No traumatic finding. Disc levels: Pronounced facet arthropathy on the right at C7-T1. Pronounced facet arthropathy on the left at C6-7. Lesser facet osteoarthritis on  the left at C3-4, C4-5, C5-6 and C7-T1. No evidence of compressive canal stenosis. Foraminal narrowing on the left that could be symptomatic. Upper chest: Negative Other: None IMPRESSION: Head CT: No acute or traumatic finding.  Age related atrophy. Cervical spine CT: No acute  or traumatic finding. Extensive chronic facet arthropathy left more than right as outlined above. Electronically Signed   By: Nelson Chimes M.D.   On: 11/18/2019 11:12   CT Chest Wo Contrast  Result Date: 11/24/2019 CLINICAL DATA:  History of fall, anticoagulated EXAM: CT CHEST WITHOUT CONTRAST TECHNIQUE: Multidetector CT imaging of the chest was performed following the standard protocol without IV contrast. COMPARISON:  11/18/2019 FINDINGS: Cardiovascular: Evaluation of the heart and great vessels is limited without IV contrast in the setting of trauma. There is a small pericardial effusion. Multi lead pacemaker is identified. Mild atherosclerosis of the LAD distribution of the coronary vessels. No pathologic adenopathy. Mediastinum/Nodes: No pathologic adenopathy. Lungs/Pleura: There are bilateral pleural effusions, volume estimated less than 1 L each. Areas of compressive atelectasis are seen within the bilateral lower lobes. Dense consolidation within the medial segment right middle lobe, as well as patchy consolidation within the bilateral lower lobes, could reflect multifocal bronchopneumonia. No pneumothorax. Upper Abdomen: Trace ascites within the upper abdomen. Nodularity of the liver capsule suggests cirrhosis. Otherwise no acute process. Musculoskeletal: There are no acute displaced fractures. Reconstructed images demonstrate evidence of a prior healed mid sternal gladiolus fracture. IMPRESSION: 1. Dense consolidation medial segment right middle lobe, with more patchy consolidation in the bilateral lower lobes. Multifocal bronchopneumonia suspected. 2. Bilateral pleural effusions volume estimated less than 1 L each. 3. Trace  pericardial effusion. 4. Trace ascites. 5. Capsular liver nodularity suggesting cirrhosis. Electronically Signed   By: Randa Ngo M.D.   On: 12/13/2019 15:38   CT Cervical Spine Wo Contrast  Result Date: 11/19/2019 CLINICAL DATA:  Trauma to the head and neck. EXAM: CT HEAD WITHOUT CONTRAST CT CERVICAL SPINE WITHOUT CONTRAST TECHNIQUE: Multidetector CT imaging of the head and cervical spine was performed following the standard protocol without intravenous contrast. Multiplanar CT image reconstructions of the cervical spine were also generated. COMPARISON:  09/08/2019 FINDINGS: CT HEAD FINDINGS Brain: Age related volume loss. No evidence of old or acute focal infarction, mass lesion, hemorrhage, hydrocephalus or extra-axial collection. Vascular: No abnormal vascular finding. Skull: No skull fracture. Sinuses/Orbits: Previous functional endoscopic sinus surgery. Other: None CT CERVICAL SPINE FINDINGS Alignment: No traumatic malalignment. 2 mm degenerative anterolisthesis C4-5. Skull base and vertebrae: Benign sclerotic focus within the posterior left C5 vertebral body. No significant primary bone lesion. No evidence of regional fracture. Soft tissues and spinal canal: No traumatic finding. Disc levels: Pronounced facet arthropathy on the right at C7-T1. Pronounced facet arthropathy on the left at C6-7. Lesser facet osteoarthritis on the left at C3-4, C4-5, C5-6 and C7-T1. No evidence of compressive canal stenosis. Foraminal narrowing on the left that could be symptomatic. Upper chest: Negative Other: None IMPRESSION: Head CT: No acute or traumatic finding.  Age related atrophy. Cervical spine CT: No acute or traumatic finding. Extensive chronic facet arthropathy left more than right as outlined above. Electronically Signed   By: Nelson Chimes M.D.   On: 12/02/2019 11:12   DG Shoulder Left  Result Date: 12/06/2019 CLINICAL DATA:  Syncope with fall.  Left shoulder pain EXAM: LEFT SHOULDER - 2+ VIEW COMPARISON:   None. FINDINGS: No acute fracture or dislocation. Chronic calcification or ossicle about the spurred acromioclavicular joint, favor the latter. Minimal calcification at the greater tuberosity of the humerus, chronic appearing. Prominent degenerative facet spurring in the cervical spine. IMPRESSION: 1. Negative for fracture. 2. Small calcifications at the distal rotator cuff which could reflect calcific tendinitis. Electronically Signed   By: Neva Seat.D.  On: 11/24/2019 10:56   DG Hip Unilat With Pelvis 2-3 Views Right  Result Date: 12/14/2019 CLINICAL DATA:  Right hip pain after fall. EXAM: DG HIP (WITH OR WITHOUT PELVIS) 2-3V RIGHT COMPARISON:  None. FINDINGS: There is no evidence of hip fracture or dislocation. There is no evidence of arthropathy or other focal bone abnormality. IMPRESSION: Negative. Electronically Signed   By: Marijo Conception M.D.   On: 11/28/2019 10:59    Procedures Procedures (including critical care time)  Medications Ordered in ED Medications  azithromycin (ZITHROMAX) 500 mg in sodium chloride 0.9 % 250 mL IVPB (500 mg Intravenous New Bag/Given (Non-Interop) 11/23/2019 1713)  cefTRIAXone (ROCEPHIN) 1 g in sodium chloride 0.9 % 100 mL IVPB (has no administration in time range)  azithromycin (ZITHROMAX) 500 mg in sodium chloride 0.9 % 250 mL IVPB (has no administration in time range)  0.9 %  sodium chloride infusion ( Intravenous New Bag/Given (Non-Interop) 12/07/2019 1711)  potassium chloride (KLOR-CON) CR tablet 10 mEq (has no administration in time range)  HYDROcodone-acetaminophen (NORCO/VICODIN) 5-325 MG per tablet 1 tablet (1 tablet Oral Given 12/10/2019 1145)  sodium chloride 0.9 % bolus 1,000 mL (0 mLs Intravenous Stopped 11/24/2019 1407)  fentaNYL (SUBLIMAZE) injection 50 mcg (50 mcg Intravenous Given 11/18/2019 1403)  cefTRIAXone (ROCEPHIN) 1 g in sodium chloride 0.9 % 100 mL IVPB (0 g Intravenous Stopped 11/23/2019 1713)    ED Course  I have reviewed the triage vital signs  and the nursing notes.  Pertinent labs & imaging results that were available during my care of the patient were reviewed by me and considered in my medical decision making (see chart for details).  Clinical Course as of Nov 16 1714  Mon Nov 16, 4986  6028 74 year old male on anticoagulation here after a fall.  Differential includes head bleed, skull fracture, cervical fracture, shoulder dislocation, shoulder fracture, hip fracture, rhabdo, metabolic derangement.   [MB]  5277 Plain film imaging interpreted by me as no gross fractures or dislocations.  Chest showing cardiomegaly and pacemaker.  Radiology read his chest x-ray has right middle lobe density and recommend a chest CT.   [MB]  1230 Patient's creatinine elevated at 2.07 although similar to a few days ago.  Baseline sounds more like 1.6.  His CPK is also markedly elevated at 2260.  Getting IV fluids.  Imaging has not shown any gross fractures.  Given a Vicodin for pain and will try to get up and ambulate.   [MB]  8242 Chest CT was done Noncon.  X-rays reading is multifocal pneumonia.  I think with his elevated CK and the pneumonia he should be admitted to the hospital for IV antibiotics and hydration.  [facility] hospitalist paged for admission.   [MB]    Clinical Course User Index [MB] Hayden Rasmussen, MD   MDM Rules/Calculators/A&P                       Final Clinical Impression(s) / ED Diagnoses Final diagnoses:  Fall, initial encounter  Community acquired pneumonia, unspecified laterality  Traumatic rhabdomyolysis, initial encounter [facility])  Syncope, unspecified syncope type  AKI (acute kidney injury) [facility])    Rx / DC Orders ED Discharge Orders    None       Hayden Rasmussen, MD 12/12/2019 1717

## 2019-11-17 NOTE — ED Notes (Signed)
Per daughter- observe patient closely with torsemide as she believes this causes confusion

## 2019-11-17 NOTE — ED Notes (Signed)
Dinner tray ordered 7:06

## 2019-11-17 NOTE — ED Notes (Signed)
Pt care endorsed from [facility]. Pt endorses "My cardiologist slows my pacemaker down in the evening which is why my SBP is lower". Pt resting in bed. Pt denies new or worsening complaints. Chest rise and fall equally with non-labored breathing.

## 2019-11-17 NOTE — ED Notes (Signed)
Pt resting in bed. Denies new or worsening complaints. Will continue to monitor.

## 2019-11-17 NOTE — ED Notes (Signed)
RN to RN report given to [place] on 2W with no questions or concerns.

## 2019-11-17 NOTE — ED Triage Notes (Signed)
Pt arrived to ED from home via [facility] d/t a reported fall that occurred yesterday that his children called EMS to get him to ED. Pt reports waking up on the floor, but no memory of the fall itself. Rt thigh pain, Lt shoulder, Lt neck, he does have a pacemaker & he takes eliquis. 100/66, CBG 137. 97.2T, 70bpm, 16R vss report while in route to ED.

## 2019-11-18 ENCOUNTER — Other Ambulatory Visit: Payer: Self-pay

## 2019-11-18 DIAGNOSIS — I5022 Chronic systolic (congestive) heart failure: Secondary | ICD-10-CM

## 2019-11-18 DIAGNOSIS — I442 Atrioventricular block, complete: Secondary | ICD-10-CM

## 2019-11-18 DIAGNOSIS — J189 Pneumonia, unspecified organism: Secondary | ICD-10-CM | POA: Diagnosis present

## 2019-11-18 DIAGNOSIS — I2510 Atherosclerotic heart disease of native coronary artery without angina pectoris: Secondary | ICD-10-CM | POA: Diagnosis present

## 2019-11-18 DIAGNOSIS — E875 Hyperkalemia: Secondary | ICD-10-CM | POA: Diagnosis not present

## 2019-11-18 DIAGNOSIS — G9341 Metabolic encephalopathy: Secondary | ICD-10-CM | POA: Diagnosis present

## 2019-11-18 DIAGNOSIS — I43 Cardiomyopathy in diseases classified elsewhere: Secondary | ICD-10-CM

## 2019-11-18 DIAGNOSIS — W19XXXA Unspecified fall, initial encounter: Secondary | ICD-10-CM | POA: Diagnosis present

## 2019-11-18 DIAGNOSIS — T796XXA Traumatic ischemia of muscle, initial encounter: Secondary | ICD-10-CM | POA: Diagnosis present

## 2019-11-18 DIAGNOSIS — I5020 Unspecified systolic (congestive) heart failure: Secondary | ICD-10-CM | POA: Diagnosis not present

## 2019-11-18 DIAGNOSIS — N184 Chronic kidney disease, stage 4 (severe): Secondary | ICD-10-CM | POA: Diagnosis present

## 2019-11-18 DIAGNOSIS — W01119S Fall on same level from slipping, tripping and stumbling with subsequent striking against unspecified sharp object, sequela: Secondary | ICD-10-CM | POA: Diagnosis not present

## 2019-11-18 DIAGNOSIS — T796XXD Traumatic ischemia of muscle, subsequent encounter: Secondary | ICD-10-CM

## 2019-11-18 DIAGNOSIS — N179 Acute kidney failure, unspecified: Secondary | ICD-10-CM | POA: Diagnosis present

## 2019-11-18 DIAGNOSIS — M109 Gout, unspecified: Secondary | ICD-10-CM | POA: Diagnosis present

## 2019-11-18 DIAGNOSIS — J45909 Unspecified asthma, uncomplicated: Secondary | ICD-10-CM | POA: Diagnosis present

## 2019-11-18 DIAGNOSIS — E876 Hypokalemia: Secondary | ICD-10-CM | POA: Diagnosis present

## 2019-11-18 DIAGNOSIS — Z7189 Other specified counseling: Secondary | ICD-10-CM | POA: Diagnosis not present

## 2019-11-18 DIAGNOSIS — I130 Hypertensive heart and chronic kidney disease with heart failure and stage 1 through stage 4 chronic kidney disease, or unspecified chronic kidney disease: Secondary | ICD-10-CM | POA: Diagnosis present

## 2019-11-18 DIAGNOSIS — S40812A Abrasion of left upper arm, initial encounter: Secondary | ICD-10-CM | POA: Diagnosis present

## 2019-11-18 DIAGNOSIS — Y92009 Unspecified place in unspecified non-institutional (private) residence as the place of occurrence of the external cause: Secondary | ICD-10-CM | POA: Diagnosis not present

## 2019-11-18 DIAGNOSIS — R54 Age-related physical debility: Secondary | ICD-10-CM | POA: Diagnosis present

## 2019-11-18 DIAGNOSIS — I4821 Permanent atrial fibrillation: Secondary | ICD-10-CM | POA: Diagnosis present

## 2019-11-18 DIAGNOSIS — Z66 Do not resuscitate: Secondary | ICD-10-CM | POA: Diagnosis present

## 2019-11-18 DIAGNOSIS — E854 Organ-limited amyloidosis: Secondary | ICD-10-CM

## 2019-11-18 DIAGNOSIS — I119 Hypertensive heart disease without heart failure: Secondary | ICD-10-CM

## 2019-11-18 DIAGNOSIS — Z20822 Contact with and (suspected) exposure to covid-19: Secondary | ICD-10-CM | POA: Diagnosis present

## 2019-11-18 DIAGNOSIS — I509 Heart failure, unspecified: Secondary | ICD-10-CM | POA: Diagnosis not present

## 2019-11-18 DIAGNOSIS — Z515 Encounter for palliative care: Secondary | ICD-10-CM | POA: Diagnosis not present

## 2019-11-18 DIAGNOSIS — R17 Unspecified jaundice: Secondary | ICD-10-CM | POA: Diagnosis present

## 2019-11-18 DIAGNOSIS — I5043 Acute on chronic combined systolic (congestive) and diastolic (congestive) heart failure: Secondary | ICD-10-CM | POA: Diagnosis not present

## 2019-11-18 DIAGNOSIS — Z96651 Presence of right artificial knee joint: Secondary | ICD-10-CM | POA: Diagnosis present

## 2019-11-18 DIAGNOSIS — I428 Other cardiomyopathies: Secondary | ICD-10-CM | POA: Diagnosis present

## 2019-11-18 LAB — BASIC METABOLIC PANEL
Anion gap: 10 (ref 5–15)
BUN: 59 mg/dL — ABNORMAL HIGH (ref 8–23)
CO2: 29 mmol/L (ref 22–32)
Calcium: 8.9 mg/dL (ref 8.9–10.3)
Chloride: 98 mmol/L (ref 98–111)
Creatinine, Ser: 1.9 mg/dL — ABNORMAL HIGH (ref 0.61–1.24)
GFR calc Af Amer: 39 mL/min — ABNORMAL LOW (ref >60)
GFR calc non Af Amer: 34 mL/min — ABNORMAL LOW (ref >60)
Glucose, Bld: 149 mg/dL — ABNORMAL HIGH (ref 70–99)
Potassium: 3.2 mmol/L — ABNORMAL LOW (ref 3.5–5.1)
Sodium: 137 mmol/L (ref 135–145)

## 2019-11-18 LAB — CBC
HCT: 37.9 % — ABNORMAL LOW (ref 39.0–52.0)
Hemoglobin: 12.1 g/dL — ABNORMAL LOW (ref 13.0–17.0)
MCH: 31 pg (ref 26.0–34.0)
MCHC: 31.9 g/dL (ref 30.0–36.0)
MCV: 97.2 fL (ref 80.0–100.0)
Platelets: 249 10*3/uL (ref 150–400)
RBC: 3.9 MIL/uL — ABNORMAL LOW (ref 4.22–5.81)
RDW: 17.1 % — ABNORMAL HIGH (ref 11.5–15.5)
WBC: 10 10*3/uL (ref 4.0–10.5)
nRBC: 0 % (ref 0.0–0.2)

## 2019-11-18 LAB — TSH: TSH: 2.14 u[IU]/mL (ref 0.350–4.500)

## 2019-11-18 LAB — VITAMIN B12: Vitamin B-12: 714 pg/mL (ref 180–914)

## 2019-11-18 LAB — CK: Total CK: 2485 U/L — ABNORMAL HIGH (ref 49–397)

## 2019-11-18 MED ORDER — POTASSIUM CHLORIDE CRYS ER 20 MEQ PO TBCR
40.0000 meq | EXTENDED_RELEASE_TABLET | Freq: Once | ORAL | Status: AC
  Administered 2019-11-18: 40 meq via ORAL
  Filled 2019-11-18: qty 2

## 2019-11-18 MED ORDER — SENNOSIDES-DOCUSATE SODIUM 8.6-50 MG PO TABS: 2.0000 | ORAL_TABLET | Freq: Every evening | ORAL | Status: DC | PRN

## 2019-11-18 MED ORDER — ACETAMINOPHEN 325 MG PO TABS
650.0000 mg | ORAL_TABLET | Freq: Four times a day (QID) | ORAL | Status: DC | PRN
  Administered 2019-11-19: 650 mg via ORAL
  Filled 2019-11-18: qty 2

## 2019-11-18 MED ORDER — IPRATROPIUM-ALBUTEROL 0.5-2.5 (3) MG/3ML IN SOLN: 3.0000 mL | RESPIRATORY_TRACT | Status: DC | PRN

## 2019-11-18 MED ORDER — FUROSEMIDE 10 MG/ML IJ SOLN
INTRAMUSCULAR | Status: AC
  Administered 2019-11-18: 40 mg via INTRAVENOUS
  Filled 2019-11-18: qty 4

## 2019-11-18 MED ORDER — FUROSEMIDE 10 MG/ML IJ SOLN: 40.0000 mg | Freq: Once | INTRAMUSCULAR | Status: AC

## 2019-11-18 MED ORDER — MIDODRINE HCL 5 MG PO TABS
5.0000 mg | ORAL_TABLET | Freq: Three times a day (TID) | ORAL | Status: DC
  Administered 2019-11-18 – 2019-11-21 (×8): 5 mg via ORAL
  Filled 2019-11-18 (×8): qty 1

## 2019-11-18 MED ORDER — POLYETHYLENE GLYCOL 3350 17 G PO PACK: 17.0000 g | PACK | Freq: Every day | ORAL | Status: DC | PRN

## 2019-11-18 MED ORDER — MIDODRINE HCL 5 MG PO TABS: 2.5000 mg | ORAL_TABLET | Freq: Three times a day (TID) | ORAL | Status: DC

## 2019-11-18 NOTE — Progress Notes (Signed)
Occupational Therapy Treatment Patient Details Name: Charles Mcmahon MRN: [id number] DOB: 11/03/1945 Today's Date: 11/18/2019    History of present illness Pt is a 74 y.o. male admitted 12/01/2019 after fall at home, found down after ~9 hrs on floor, pt does not remember cause of fall; worked up for mild rhabdomyolysis. Head CT negative for acute abnormality; unable to do MRI secondary to pacemaker. CXR with suspected multifocal PNA. PMH includes nonischemic cardiomyopathy with history of complete heart block s/p pacemaker, CKD IV, afib, CAD, R TKR.   OT comments  Return for second visit as pt with urinary urgency and attempt to walk to bathroom. Pt requiring Supervision-Min Guard A for urination at toilet, hand hygiene at sink, and functional mobility with SPC. Pt requiring Min A for donning of depends; pt typically uses AE at home at Mod I level. Daughter present at end of session and wanting to discuss medication with MD; notified RN. Continue to recommend dc to home once medically stable per phsyician.    Follow Up Recommendations  No OT follow up    Equipment Recommendations  None recommended by OT    Recommendations for Other Services PT consult    Precautions / Restrictions Precautions Precautions: Fall;Other (comment) Precaution Comments: Hypotension (asymptomatic) Restrictions Weight Bearing Restrictions: No       Mobility Bed Mobility Overal bed mobility: Independent             General bed mobility comments: Increased time secondary to R thigh pain; bed flat  Transfers Overall transfer level: Needs assistance Equipment used: Straight cane Transfers: Sit to/from Stand Sit to Stand: Supervision;Min guard         General transfer comment: Supervision-Min Guard A for safety    Balance Overall balance assessment: Needs assistance   Sitting balance-Leahy Scale: Good       Standing balance-Leahy Scale: Fair Standing balance comment: Dynamic stability improved  with UE support                           ADL either performed or assessed with clinical judgement   ADL Overall ADL's : Needs assistance/impaired     Grooming: Set up;Supervision/safety;Standing;Wash/dry hands Grooming Details (indicate cue type and reason): Hand hygiene at sink             Lower Body Dressing: Minimal assistance;Sit to/from stand Lower Body Dressing Details (indicate cue type and reason): Min A for donning over feet as pt normally uses reacher. Min Guard A for safety in Quarry manager Details (indicate cue type and reason): Min Guard A for safety in standing during urination at toilet Toileting- Water quality scientist and Hygiene: Min guard;Sit to/from stand       Functional mobility during ADLs: Min guard;Cane General ADL Comments: Returned for second visit as pt with urinary urgency and needing to use restroom. Discussed hoem set up with daughter present.     Vision       Perception     Praxis      Cognition Arousal/Alertness: Awake/alert Behavior During Therapy: WFL for tasks assessed/performed Overall Cognitive Status: Within Functional Limits for tasks assessed                                 General Comments: Very motivated to participate in therapy        Exercises     Shoulder Instructions  General Comments Daughter present at end of session    Pertinent Vitals/ Pain       Pain Assessment: Faces Faces Pain Scale: Hurts a little bit Pain Location: RLE Pain Descriptors / Indicators: Tightness;Grimacing Pain Intervention(s): Monitored during session  Home Living                                          Prior Functioning/Environment              Frequency  Min 2X/week        Progress Toward Goals  OT Goals(current goals can now be found in the care plan section)  Progress towards OT goals: Progressing toward goals  Acute Rehab OT Goals Patient Stated  Goal: "Get rid of this stiffness" OT Goal Formulation: With patient Time For Goal Achievement: 12/02/19 Potential to Achieve Goals: Good ADL Goals Pt Will Perform Grooming: Independently;standing Pt Will Perform Lower Body Dressing: Independently;sit to/from stand Pt Will Transfer to Toilet: Independently;ambulating;regular height toilet Pt Will Perform Toileting - Clothing Manipulation and hygiene: Independently;sitting/lateral leans;sit to/from stand Pt Will Perform Tub/Shower Transfer: Shower transfer;with supervision;shower seat;ambulating  Plan Discharge plan remains appropriate    Co-evaluation                 AM-PAC OT "6 Clicks" Daily Activity     Outcome Measure   Help from another person eating meals?: None Help from another person taking care of personal grooming?: None Help from another person toileting, which includes using toliet, bedpan, or urinal?: A Little Help from another person bathing (including washing, rinsing, drying)?: A Little Help from another person to put on and taking off regular upper body clothing?: None Help from another person to put on and taking off regular lower body clothing?: A Little 6 Click Score: 21    End of Session Equipment Utilized During Treatment: Other (comment)(cane)  OT Visit Diagnosis: Unsteadiness on feet (R26.81);Muscle weakness (generalized) (M62.81);Pain Pain - Right/Left: Right Pain - part of body: Leg   Activity Tolerance Patient tolerated treatment well   Patient Left in bed;with call bell/phone within reach;with bed alarm set;with family/visitor present   Nurse Communication Mobility status;Other (comment)(Daughter wants to talk with MD)        Time: 7124-5809 OT Time Calculation (min): 14 min  Charges: OT General Charges $OT Visit: 1 Visit OT Treatments $Self Care/Home Management : 8-22 mins  [place], OTR/L Acute Rehab Pager: [phone] Office: [place] 11/18/2019, 6:11 PM

## 2019-11-18 NOTE — Evaluation (Signed)
Physical Therapy Evaluation Patient Details Name: Charles Mcmahon MRN: [id number] DOB: 10/17/1945 Today's Date: 11/18/2019   History of Present Illness  Pt is a 74 y.o. male admitted 12/04/2019 after fall at home, found down after ~9 hrs on floor, pt does not remember cause of fall; worked up for mild rhabdomyolysis. Head CT negative for acute abnormality; unable to do MRI secondary to pacemaker. CXR with suspected multifocal PNA. PMH includes nonischemic cardiomyopathy with history of complete heart block s/p pacemaker, CKD IV, afib, CAD, R TKR.    Clinical Impression  Pt presents with an overall decrease in functional mobility secondary to above. PTA, pt mod independent with cane; pt lives with wife who requires assistance since previous CVA, pt and daughter care for her. Today, pt ambulatory with cane and intermittent min guard for balance; c/o RLE stiffness which improved with ambulation and stretching. Pt hypotensive (see values below), but denies dizziness; SpO2 100% on RA. Educ on positioning, therex, and importance of mobility. Pt would benefit from continued acute PT services to maximize functional mobility and independence prior to d/c home.   Standing BP 88/67 Post-ambulation BP 91/68    Follow Up Recommendations No PT follow up;Supervision - Intermittent    Equipment Recommendations  None recommended by PT    Recommendations for Other Services       Precautions / Restrictions Precautions Precautions: Fall;Other (comment) Precaution Comments: Hypotension (asymptomatic) Restrictions Weight Bearing Restrictions: No      Mobility  Bed Mobility Overal bed mobility: Independent             General bed mobility comments: Increased time secondary to R thigh pain; bed flat  Transfers Overall transfer level: Needs assistance Equipment used: Straight cane Transfers: Sit to/from Stand Sit to Stand: Supervision         General transfer comment: Supervision-Min Guard A for  safety  Ambulation/Gait Ambulation/Gait assistance: Min Gaffer (Feet): 100 Feet Assistive device: Straight cane Gait Pattern/deviations: Step-through pattern;Decreased stride length;Antalgic   Gait velocity interpretation: 1.31 - 2.62 ft/sec, indicative of limited community ambulator General Gait Details: Slow, antalgic gait with cane and initial min guard for balance, progressing to supervision; intermittent standing breaks to stretch R thigh secondary to c/o tightness. Pt reports stiffness improving with gait  Stairs            Wheelchair Mobility    Modified Rankin (Stroke Patients Only)       Balance Overall balance assessment: Needs assistance   Sitting balance-Leahy Scale: Good       Standing balance-Leahy Scale: Fair Standing balance comment: Dynamic stability improved with UE support                             Pertinent Vitals/Pain Pain Assessment: Faces Faces Pain Scale: Hurts little more Pain Location: RLE Pain Descriptors / Indicators: Tightness;Grimacing Pain Intervention(s): Monitored during session;Repositioned(led pt through stretches)    Home Living Family/patient expects to be discharged to:: Private residence Living Arrangements: Spouse/significant other Available Help at Discharge: Family;Available PRN/intermittently Type of Home: House Home Access: Level entry     Home Layout: One level Home Equipment: Walker - 2 wheels;Grab bars - tub/shower;Shower seat;Bedside commode;Hospital bed;Cane - quad;Adaptive equipment Additional Comments: Pt is caregiver for spouse who has h/o CVA with aphasia. Daughter lives nearby and also assists with pt's spouse    Prior Function Level of Independence: Independent with assistive device(s)  Comments: Mod indep with cane. Performs BADLs with AE and light IADLs. daughter comes and assists with spouse and IADLs     Hand Dominance   Dominant Hand: Right     Extremity/Trunk Assessment   Upper Extremity Assessment Upper Extremity Assessment: Overall WFL for tasks assessed    Lower Extremity Assessment Lower Extremity Assessment: RLE deficits/detail RLE Deficits / Details: h/o R TKA with R knee flexion limited to 60' since this sx; c/o R quad/hip flexor tightness since fall (improving with walking and stretching); hip imaging negative for injury    Cervical / Trunk Assessment Cervical / Trunk Assessment: Normal  Communication   Communication: HOH(wears hearing aides)  Cognition Arousal/Alertness: Awake/alert Behavior During Therapy: WFL for tasks assessed/performed Overall Cognitive Status: Within Functional Limits for tasks assessed                                 General Comments: Very motivated to participate in therapy      General Comments General comments (skin integrity, edema, etc.): SpO2 100% on RA. Sitting BP 92/62, standing BP 88/67, post-ambulation BP 91/68; pt denies dizziness    Exercises Other Exercises Other Exercises: R quad stretching (knee flexion while seated EOB) with hip flexor stretching (leaning back into long sitting with knee flexed off EOB); educ on heel slides with blanket/sheet while supine   Assessment/Plan    PT Assessment Patient needs continued PT services  PT Problem List Decreased strength;Decreased range of motion;Decreased activity tolerance;Decreased balance;Decreased mobility;Cardiopulmonary status limiting activity       PT Treatment Interventions DME instruction;Gait training;Stair training;Functional mobility training;Therapeutic activities;Therapeutic exercise;Balance training;Patient/family education    PT Goals (Current goals can be found in the Care Plan section)  Acute Rehab PT Goals Patient Stated Goal: "Get rid of this stiffness" PT Goal Formulation: With patient Time For Goal Achievement: 12/02/19 Potential to Achieve Goals: Good    Frequency Min 3X/week    Barriers to discharge        Co-evaluation               AM-PAC PT "6 Clicks" Mobility  Outcome Measure Help needed turning from your back to your side while in a flat bed without using bedrails?: None Help needed moving from lying on your back to sitting on the side of a flat bed without using bedrails?: None Help needed moving to and from a bed to a chair (including a wheelchair)?: None Help needed standing up from a chair using your arms (e.g., wheelchair or bedside chair)?: None Help needed to walk in hospital room?: A Little Help needed climbing 3-5 steps with a railing? : A Little 6 Click Score: 22    End of Session Equipment Utilized During Treatment: Gait belt Activity Tolerance: Patient tolerated treatment well Patient left: in bed;with call bell/phone within reach;with bed alarm set Nurse Communication: Mobility status PT Visit Diagnosis: Other abnormalities of gait and mobility (R26.89)    Time: 6629-4765 PT Time Calculation (min) (ACUTE ONLY): 18 min   Charges:   PT Evaluation $PT Eval Low Complexity: [place], PT, DPT Acute Rehabilitation Services  Pager [phone] Office [place] 11/18/2019, 10:35 AM

## 2019-11-18 NOTE — Progress Notes (Signed)
Patient discussed incentive spirometer and flutter valve.  Patient able to reach 1250 mL with the incentive spirometer.  Patient states he does not have any congection and does not want a flutter valve at this time.  Patient did give a dry non productive cough when asked.

## 2019-11-18 NOTE — Progress Notes (Signed)
PROGRESS NOTE    Charles Mcmahon  IPJ:[id number] DOB: 09/19/1946 DOA: 12/11/2019 PCP: [name], [name], MD   Brief Narrative:  74 year old with history of complete heart block status post pacemaker, persistent A. fib, systolic CHF EF 73%, CKD stage IV presented with fall found to be in rhabdo.  CT showed dense consolidation in the right middle and bilateral lower lobes.  Diagnosed with community-acquired pneumonia started on Rocephin and azithromycin as well.   Assessment & Plan:   Active Problems:   Hypertension with heart disease   Permanent atrial fibrillation (HCC)   Chronic heart failure (HCC)   Heart block AV complete (HCC)   CKD (chronic kidney disease) stage 4, GFR 15-29 ml/min (HCC)   HFrEF (heart failure with reduced ejection fraction) ([place])   Rhabdomyolysis   Fall at home, initial encounter   Pneumonia  Fall, unknown etiology Mild to moderate rhabdomyolysis Hypokalemia -No evidence of major trauma. Will need device interrogated -Gentle hydration, trend CK levels.  Slightly elevated today to 2485 -PT/OT -CT head-negative.  Aggressive repletion of fluid  Multifocal community-acquired pneumonia -IV Rocephin and azithromycin -COVID-19-negative -Bronchodilators, incentive spirometer and flutter valve -Check procalcitonin; if neg- stop Abx.   Mild metabolic encephalopathy -A19 and TSH within normal limits  CKD stage IV -Baseline 1.7.  Admission creatinine 2.06 -Closely monitor, gentle hydration.  Congestive heart failure with reduced ejection fraction, class II Bilateral pleural effusion Complete heart block with pacemaker in place History of cardiac amyloid -Heart failure team consulted by the admitting provider -Diuretics and beta-blocker currently on hold.  Will let cardiology team further adjust this as necessary  DVT prophylaxis: Eliquis Code Status: DNR Family Communication: Spoke with family over the phone Disposition Plan:   Patient From=  home  Patient Anticipated D/C place= Home  Barriers= pending CHF team evaluation, improvement in CK levels, PT/OT evaluation for safe disposition.  Consultants:  Cardiology  Procedures:   None  Antimicrobials:   Rocephin  Azithromycin   Subjective: Feels weak but overall feeling better than yesterday.  Denies any head trauma.  Occasionally still feels dizziness and lightheadedness.  Review of Systems Otherwise negative except as per HPI, including: General: Denies fever, chills, night sweats or unintended weight loss. Resp: Denies cough, wheezing, shortness of breath. Cardiac: Denies chest pain, palpitations, orthopnea, paroxysmal nocturnal dyspnea. GI: Denies abdominal pain, nausea, vomiting, diarrhea or constipation GU: Denies dysuria, frequency, hesitancy or incontinence MS: Denies muscle aches, joint pain or swelling Neuro: Denies headache, neurologic deficits (focal weakness, numbness, tingling), abnormal gait Psych: Denies anxiety, depression, SI/HI/AVH Skin: Denies new rashes or lesions ID: Denies sick contacts, exotic exposures, travel  Examination:  General exam: Chronically ill and frail appearing. Respiratory system: Clear to auscultation. Respiratory effort normal. Cardiovascular system: S1 & S2 heard, RRR. No JVD, murmurs, rubs, gallops or clicks.  1+ left lower extremity edema.  Some abrasion over his upper extremity noted. Gastrointestinal system: Abdomen is nondistended, soft and nontender. No organomegaly or masses felt. Normal bowel sounds heard. Central nervous system: Alert and oriented. No focal neurological deficits. Extremities: Symmetric 5 x 5 power. Skin: Mild upper extremity abrasion without any evidence of active bleeding. Psychiatry: Judgement and insight appear normal. Mood & affect appropriate.     Objective: Vitals:   12/05/2019 1945 11/23/2019 2030 11/26/2019 2255 11/18/19 0255  BP: (!) 91/54 102/63 102/61 105/70  Pulse: 70  70 65  Resp:     15  Temp:   98.7 F (37.1 C) 98.8 F (37.1 C)  TempSrc:  Oral Oral  SpO2: 94%  97% 96%  Height:   5\' 9"  (1.753 m)     Intake/Output Summary (Last 24 hours) at 11/18/2019 0753 Last data filed at 11/18/2019 0600 Gross per 24 hour  Intake 1730 ml  Output 400 ml  Net 1330 ml   There were no vitals filed for this visit.   Data Reviewed:   CBC: Recent Labs  Lab 11/20/2019 1029 11/18/19 0250  WBC 10.0 10.0  NEUTROABS 8.9*  --   HGB 13.6 12.1*  HCT 43.3 37.9*  MCV 98.4 97.2  PLT 281 616   Basic Metabolic Panel: Recent Labs  Lab 11/13/19 1445 11/20/2019 1029  NA 135 140  K 3.4* 3.3*  CL 95* 99  CO2 30 26  GLUCOSE 90 113*  BUN 57* 60*  CREATININE 2.06* 2.07*  CALCIUM 8.4* 9.3   GFR: Estimated Creatinine Clearance: 31.3 mL/min (A) (by C-G formula based on SCr of 2.07 mg/dL (H)). Liver Function Tests: No results for input(s): AST, ALT, ALKPHOS, BILITOT, PROT, ALBUMIN in the last 168 hours. No results for input(s): LIPASE, AMYLASE in the last 168 hours. No results for input(s): AMMONIA in the last 168 hours. Coagulation Profile: No results for input(s): INR, PROTIME in the last 168 hours. Cardiac Enzymes: Recent Labs  Lab 12/14/2019 1029 11/18/19 0250  CKTOTAL 2,260* 2,485*   BNP (last 3 results) Recent Labs    05/27/19 1318 06/17/19 0938  PROBNP 755.0* 661.0*   HbA1C: No results for input(s): HGBA1C in the last 72 hours. CBG: No results for input(s): GLUCAP in the last 168 hours. Lipid Profile: No results for input(s): CHOL, HDL, LDLCALC, TRIG, CHOLHDL, LDLDIRECT in the last 72 hours. Thyroid Function Tests: Recent Labs    11/18/19 0251  TSH 2.140   Anemia Panel: Recent Labs    11/18/19 0250  VITAMINB12 714   Sepsis Labs: No results for input(s): PROCALCITON, LATICACIDVEN in the last 168 hours.  Recent Results (from the past 240 hour(s))  Respiratory Panel by RT PCR (Flu A&B, Covid) - Nasopharyngeal Swab     Status: None   Collection Time:  11/29/2019  3:51 PM   Specimen: Nasopharyngeal Swab  Result Value Ref Range Status   SARS Coronavirus 2 by RT PCR NEGATIVE NEGATIVE Final    Comment: (NOTE) SARS-CoV-2 target nucleic acids are NOT DETECTED. The SARS-CoV-2 RNA is generally detectable in upper respiratoy specimens during the acute phase of infection. The lowest concentration of SARS-CoV-2 viral copies this assay can detect is 131 copies/mL. A negative result does not preclude SARS-Cov-2 infection and should not be used as the sole basis for treatment or other patient management decisions. A negative result may occur with  improper specimen collection/handling, submission of specimen other than nasopharyngeal swab, presence of viral mutation(s) within the areas targeted by this assay, and inadequate number of viral copies (<131 copies/mL). A negative result must be combined with clinical observations, patient history, and epidemiological information. The expected result is Negative. Fact Sheet for Patients:  PinkCheek.be Fact Sheet for Healthcare Providers:  GravelBags.it This test is not yet ap proved or cleared by the [place] FDA and  has been authorized for detection and/or diagnosis of SARS-CoV-2 by FDA under an Emergency Use Authorization (EUA). This EUA will remain  in effect (meaning this test can be used) for the duration of the COVID-19 declaration under Section 564(b)(1) of the Act, 21 U.S.C. section 360bbb-3(b)(1), unless the authorization is terminated or revoked sooner.    Influenza A  by PCR NEGATIVE NEGATIVE Final   Influenza B by PCR NEGATIVE NEGATIVE Final    Comment: (NOTE) The Xpert Xpress SARS-CoV-2/FLU/RSV assay is intended as an aid in  the diagnosis of influenza from Nasopharyngeal swab specimens and  should not be used as a sole basis for treatment. Nasal washings and  aspirates are unacceptable for Xpert Xpress SARS-CoV-2/FLU/RSV   testing. Fact Sheet for Patients: PinkCheek.be Fact Sheet for Healthcare Providers: GravelBags.it This test is not yet approved or cleared by the [place] FDA and  has been authorized for detection and/or diagnosis of SARS-CoV-2 by  FDA under an Emergency Use Authorization (EUA). This EUA will remain  in effect (meaning this test can be used) for the duration of the  Covid-19 declaration under Section 564(b)(1) of the Act, 21  U.S.C. section 360bbb-3(b)(1), unless the authorization is  terminated or revoked. Performed at [facility], [place] [address], [place], [place] [address]          Radiology Studies: DG Chest 1 View  Result Date: 12/08/2019 CLINICAL DATA:  Pain secondary to a fall due to syncope. EXAM: CHEST  1 VIEW COMPARISON:  04/26/2019 FINDINGS: There is a new abnormal density in the right middle lobe adjacent to the right heart border which could represent an infiltrate or mass. There is new slight atelectasis at the left base posterior medially. Heart size and pulmonary vascularity are normal. Aortic atherosclerosis. Pacemaker in place. No discrete effusions or acute bone abnormalities. IMPRESSION: New abnormal density in the right middle lobe which could represent an infiltrate or mass. CT scan of the chest with contrast is recommended for further evaluation if the patient does not have findings consistent with pneumonia. Aortic Atherosclerosis (ICD10-I70.0). Electronically Signed   By: Lorriane Shire M.D.   On: 12/10/2019 10:58   CT Head Wo Contrast  Result Date: 12/03/2019 CLINICAL DATA:  Trauma to the head and neck. EXAM: CT HEAD WITHOUT CONTRAST CT CERVICAL SPINE WITHOUT CONTRAST TECHNIQUE: Multidetector CT imaging of the head and cervical spine was performed following the standard protocol without intravenous contrast. Multiplanar CT image reconstructions of the cervical spine were also generated.  COMPARISON:  09/08/2019 FINDINGS: CT HEAD FINDINGS Brain: Age related volume loss. No evidence of old or acute focal infarction, mass lesion, hemorrhage, hydrocephalus or extra-axial collection. Vascular: No abnormal vascular finding. Skull: No skull fracture. Sinuses/Orbits: Previous functional endoscopic sinus surgery. Other: None CT CERVICAL SPINE FINDINGS Alignment: No traumatic malalignment. 2 mm degenerative anterolisthesis C4-5. Skull base and vertebrae: Benign sclerotic focus within the posterior left C5 vertebral body. No significant primary bone lesion. No evidence of regional fracture. Soft tissues and spinal canal: No traumatic finding. Disc levels: Pronounced facet arthropathy on the right at C7-T1. Pronounced facet arthropathy on the left at C6-7. Lesser facet osteoarthritis on the left at C3-4, C4-5, C5-6 and C7-T1. No evidence of compressive canal stenosis. Foraminal narrowing on the left that could be symptomatic. Upper chest: Negative Other: None IMPRESSION: Head CT: No acute or traumatic finding.  Age related atrophy. Cervical spine CT: No acute or traumatic finding. Extensive chronic facet arthropathy left more than right as outlined above. Electronically Signed   By: Nelson Chimes M.D.   On: 12/12/2019 11:12   CT Chest Wo Contrast  Result Date: 12/01/2019 CLINICAL DATA:  History of fall, anticoagulated EXAM: CT CHEST WITHOUT CONTRAST TECHNIQUE: Multidetector CT imaging of the chest was performed following the standard protocol without IV contrast. COMPARISON:  11/29/2019 FINDINGS: Cardiovascular: Evaluation of the  heart and great vessels is limited without IV contrast in the setting of trauma. There is a small pericardial effusion. Multi lead pacemaker is identified. Mild atherosclerosis of the LAD distribution of the coronary vessels. No pathologic adenopathy. Mediastinum/Nodes: No pathologic adenopathy. Lungs/Pleura: There are bilateral pleural effusions, volume estimated less than 1 L each.  Areas of compressive atelectasis are seen within the bilateral lower lobes. Dense consolidation within the medial segment right middle lobe, as well as patchy consolidation within the bilateral lower lobes, could reflect multifocal bronchopneumonia. No pneumothorax. Upper Abdomen: Trace ascites within the upper abdomen. Nodularity of the liver capsule suggests cirrhosis. Otherwise no acute process. Musculoskeletal: There are no acute displaced fractures. Reconstructed images demonstrate evidence of a prior healed mid sternal gladiolus fracture. IMPRESSION: 1. Dense consolidation medial segment right middle lobe, with more patchy consolidation in the bilateral lower lobes. Multifocal bronchopneumonia suspected. 2. Bilateral pleural effusions volume estimated less than 1 L each. 3. Trace pericardial effusion. 4. Trace ascites. 5. Capsular liver nodularity suggesting cirrhosis. Electronically Signed   By: Randa Ngo M.D.   On: 11/28/2019 15:38   CT Cervical Spine Wo Contrast  Result Date: 12/08/2019 CLINICAL DATA:  Trauma to the head and neck. EXAM: CT HEAD WITHOUT CONTRAST CT CERVICAL SPINE WITHOUT CONTRAST TECHNIQUE: Multidetector CT imaging of the head and cervical spine was performed following the standard protocol without intravenous contrast. Multiplanar CT image reconstructions of the cervical spine were also generated. COMPARISON:  09/08/2019 FINDINGS: CT HEAD FINDINGS Brain: Age related volume loss. No evidence of old or acute focal infarction, mass lesion, hemorrhage, hydrocephalus or extra-axial collection. Vascular: No abnormal vascular finding. Skull: No skull fracture. Sinuses/Orbits: Previous functional endoscopic sinus surgery. Other: None CT CERVICAL SPINE FINDINGS Alignment: No traumatic malalignment. 2 mm degenerative anterolisthesis C4-5. Skull base and vertebrae: Benign sclerotic focus within the posterior left C5 vertebral body. No significant primary bone lesion. No evidence of regional  fracture. Soft tissues and spinal canal: No traumatic finding. Disc levels: Pronounced facet arthropathy on the right at C7-T1. Pronounced facet arthropathy on the left at C6-7. Lesser facet osteoarthritis on the left at C3-4, C4-5, C5-6 and C7-T1. No evidence of compressive canal stenosis. Foraminal narrowing on the left that could be symptomatic. Upper chest: Negative Other: None IMPRESSION: Head CT: No acute or traumatic finding.  Age related atrophy. Cervical spine CT: No acute or traumatic finding. Extensive chronic facet arthropathy left more than right as outlined above. Electronically Signed   By: Nelson Chimes M.D.   On: 12/14/2019 11:12   DG Shoulder Left  Result Date: 11/20/2019 CLINICAL DATA:  Syncope with fall.  Left shoulder pain EXAM: LEFT SHOULDER - 2+ VIEW COMPARISON:  None. FINDINGS: No acute fracture or dislocation. Chronic calcification or ossicle about the spurred acromioclavicular joint, favor the latter. Minimal calcification at the greater tuberosity of the humerus, chronic appearing. Prominent degenerative facet spurring in the cervical spine. IMPRESSION: 1. Negative for fracture. 2. Small calcifications at the distal rotator cuff which could reflect calcific tendinitis. Electronically Signed   By: Monte Fantasia M.D.   On: 12/14/2019 10:56   DG Hip Unilat With Pelvis 2-3 Views Right  Result Date: 12/06/2019 CLINICAL DATA:  Right hip pain after fall. EXAM: DG HIP (WITH OR WITHOUT PELVIS) 2-3V RIGHT COMPARISON:  None. FINDINGS: There is no evidence of hip fracture or dislocation. There is no evidence of arthropathy or other focal bone abnormality. IMPRESSION: Negative. Electronically Signed   By: Bobbe Medico.D.  On: 12/05/2019 10:59        Scheduled Meds: . allopurinol  100 mg Oral BID  . apixaban  5 mg Oral BID  . finasteride  5 mg Oral q morning - 10a   Continuous Infusions: . azithromycin    . cefTRIAXone (ROCEPHIN)  IV       LOS: 0 days   Time spent= 35  mins    Remell Giaimo Arsenio Loader, MD [facility]  If 7PM-7AM, please contact night-coverage  11/18/2019, 7:53 AM

## 2019-11-18 NOTE — Plan of Care (Signed)

## 2019-11-18 NOTE — Consult Note (Addendum)
Advanced Heart Failure Team Consult Note   Primary Physician: [name], [name], MD PCP-Cardiologist:  Charles Dresser, MD  Reason for Consultation: Heart Failure   HPI:    Charles Mcmahon is seen today for evaluation of heart failure  at the request of  Charles Charles Mcmahon   Charles Mcmahon is a 74 year old with a history of CHB, persistent atrial fib, chronic systolic hf, In 9678, he developed complete heart block (given geographic location, would question whether this could be Lyme carditis-related) and had PPM placed.  LV EF was noted to fall over time, thought to be due to RV pacing.  Cath in 4/14 in setting of EF 35% showed no significant coronary disease.  In 12/18, his device was upgraded to [facility] CRT-P (declined ICD), but EF remained low.  Last echo in 11/20 showed EF 20-25%, severe LVH (speckled myocardium), severely decreased RV systolic function. He also has persistent atrial fibrillation (most of the time in atrial fibrillation though NSR noted on at least 1 occasion).    He had RHC in 12/20 showed preserved cardiac output but elevated right and left heart filling pressures.   He was seen by Charles Mcmahon on 10/23/19 and was volume overloaded. He was instructed to add metolazone on Monday + Thursdays. He continued on torsemide 80 mg twice a day.   I saw him in the HF clinic on 1/29.  At that time he was dry and had elevated creatinine so metolazone was stopped. Creatinine had gone up from 1.68>2.1. He continued on torsemide 80 mg twice a day. PYP scan (grade 2, H/CLL equal 1.8) are strongly suggestive of transthyretin amyloidosis) . Had invitae genetic test.   Yesterday he presented to [facility] after unwitnessed fall. He was found down on the floor after being there for 9 hours. He is not sure what happened.  Received 1 liter IV fluids. CXR concerning for bilateral pneumonia. CT of chest  cocerning  for multifocal pneumonia. Pertinent labs included: sodium 140, K 3.3, creatinine 2, total CK 2260,  BNP 943, WBC 10. Coreg, torsemide, spiro held.   Feeling better today. Denies SOB. Able to walk down the hall.   Echo 09/08/19  1. LV hypertrophy with appearance of myocardium concerning for  infiltrative process such as amyloidosis. Consider PYP if clinical  suspicion for amyloid is high.  2. Biventricular severe dysfunction.  3. Left ventricular ejection fraction, by visual estimation, is 20 to  25%. The left ventricle has severely decreased function. There is severely  increased left ventricular hypertrophy.  4. Elevated left ventricular end-diastolic pressure.  5. Left ventricular diastolic parameters are indeterminate.  6. The left ventricle demonstrates global hypokinesis.  7. Global right ventricle has severely reduced systolic function.The  right ventricular size is mildly enlarged. Moderately increased right  ventricular wall thickness.  8. Left atrial size was mildly dilated.  9. Right atrial size was moderately dilated.  10. Small pericardial effusion.  11. The mitral valve is abnormal. Mild mitral valve regurgitation.  12. The tricuspid valve is grossly normal. Tricuspid valve regurgitation  moderate.  13. The aortic valve is tricuspid. Aortic valve regurgitation is not  visualized. No evidence of aortic valve sclerosis or stenosis.  14. The pulmonic valve was grossly normal. Pulmonic valve regurgitation is  not visualized.   RHC  10/15/19  mean 14 RV 41/14 PA 52/20, mean 31 PCWP mean 25 Oxygen saturations: PA 63% AO 95% Cardiac Output (Fick) 4.74  Cardiac Index (Fick) 2.41 PVR  1.3 WU  Review of Systems: [y] = yes, [ ]  = no   . General: Weight gain [ ] ; Weight loss [ ] ; Anorexia [ ] ; Fatigue [ ] ; Fever [ ] ; Chills [ ] ; Weakness [ ]   . Cardiac: Chest pain/pressure [ ] ; Resting SOB [ ] ; Exertional SOB [Y ]; Orthopnea [ ] ; Pedal Edema [ ] ; Palpitations [ ] ; Syncope [Y ]; Presyncope [ ] ; Paroxysmal nocturnal dyspnea[ ]   . Pulmonary: Cough [ ] ; Wheezing[ ] ;  Hemoptysis[ ] ; Sputum [ ] ; Snoring [ ]   . GI: Vomiting[ ] ; Dysphagia[ ] ; Melena[ ] ; Hematochezia [ ] ; Heartburn[ ] ; Abdominal pain [ ] ; Constipation [ ] ; Diarrhea [ ] ; BRBPR [ ]   . GU: Hematuria[ ] ; Dysuria [ ] ; Nocturia[ ]   . Vascular: Pain in legs with walking [ ] ; Pain in feet with lying flat [ ] ; Non-healing sores [ ] ; Stroke [ ] ; TIA [ ] ; Slurred speech [ ] ;  . Neuro: Headaches[ ] ; Vertigo[ ] ; Seizures[ ] ; Paresthesias[ ] ;Blurred vision [ ] ; Diplopia [ ] ; Vision changes [ ]   . Ortho/Skin: Arthritis [ ] ; Joint pain [Y ]; Muscle pain [ ] ; Joint swelling [ ] ; Back Pain [Y ]; Rash [ ]   . Psych: Depression[ ] ; Anxiety[ ]   . Heme: Bleeding problems [ ] ; Clotting disorders [ ] ; Anemia [ ]   . Endocrine: Diabetes [ ] ; Thyroid dysfunction[ ]   Home Medications Prior to Admission medications   Medication Sig Start Date End Date Taking? Authorizing Provider  acetaminophen (TYLENOL) 650 MG CR tablet Take 1,300 mg by mouth daily.    Yes [provider]  albuterol (VENTOLIN HFA) 108 (90 Base) MCG/ACT inhaler Inhale 2 puffs into the lungs every 6 (six) hours as needed for wheezing or shortness of breath. 09/26/19  Yes [name], [name], MD  allopurinol (ZYLOPRIM) 100 MG tablet Take 100 mg by mouth 2 (two) times daily.   Yes [provider]  apixaban (ELIQUIS) 5 MG TABS tablet Take 1 tablet (5 mg total) by mouth 2 (two) times daily. 10/15/19  Yes Charles Dresser, MD  carvedilol (COREG) 3.125 MG tablet Take 1 tablet (3.125 mg total) by mouth 2 (two) times daily. 10/20/19  Yes Charles Dresser, MD  finasteride (PROSCAR) 5 MG tablet Take 5 mg by mouth every morning.    Yes [provider]  OVER THE COUNTER MEDICATION Apply 1 application topically 3 (three) times daily as needed (pain). CBD Cream   Yes [provider]  potassium chloride 20 MEQ/15ML (10%) SOLN Take 60 mLs (80 mEq total) by mouth daily. 11/06/19  Yes Charles Dresser, MD  spironolactone (ALDACTONE) 25 MG  tablet Take 0.5 tablets (12.5 mg total) by mouth daily. 10/23/19 01/21/20 Yes Charles Dresser, MD  torsemide (DEMADEX) 20 MG tablet Take 4 tablets (80 mg total) by mouth 2 (two) times daily. 10/15/19  Yes Charles Dresser, MD    Past Medical History: Past Medical History:  Diagnosis Date  . Allergy   . Arthritis   . Asthma   . Cancer ([place])   . Cardiac arrhythmia due to congenital heart disease   . Coronary artery disease   . Heart disease   . Heart disease   . Heart murmur   . Hx: UTI (urinary tract infection)     Past Surgical History: Past Surgical History:  Procedure Laterality Date  . CHOLECYSTECTOMY  2008  . HERNIA REPAIR  2019   right side anguler  . MELANOMA EXCISION Right 1993  right arm  . PACEMAKER IMPLANT    . REPLACEMENT TOTAL KNEE Right   . RIGHT HEART CATH N/A 10/15/2019   Procedure: RIGHT HEART CATH;  Surgeon: Charles Dresser, MD;  Location: [facility];  Service: Cardiovascular;  Laterality: N/A;    Family History: Family History  Problem Relation Age of Onset  . Arthritis Mother     Social History: Social History   Socioeconomic History  . Marital status: Married    Spouse name: Not on file  . Number of children: 2  . Years of education: Not on file  . Highest education level: Not on file  Occupational History  . Not on file  Tobacco Use  . Smoking status: Former Research scientist (life sciences)  . Smokeless tobacco: Never Used  Substance and Sexual Activity  . Alcohol use: Yes  . Drug use: Never  . Sexual activity: Not Currently  Other Topics Concern  . Not on file  Social History Narrative  . Not on file   Social Determinants of Health   Financial Resource Strain:   . Difficulty of Paying Living Expenses: Not on file  Food Insecurity:   . Worried About Charity fundraiser in the Last Year: Not on file  . Ran Out of Food in the Last Year: Not on file  Transportation Needs:   . Lack of Transportation (Medical): Not on file  . Lack of Transportation  (Non-Medical): Not on file  Physical Activity:   . Days of Exercise per Week: Not on file  . Minutes of Exercise per Session: Not on file  Stress:   . Feeling of Stress : Not on file  Social Connections:   . Frequency of Communication with Friends and Family: Not on file  . Frequency of Social Gatherings with Friends and Family: Not on file  . Attends Religious Services: Not on file  . Active Member of Clubs or Organizations: Not on file  . Attends Archivist Meetings: Not on file  . Marital Status: Not on file    Allergies:  No Known Allergies  Objective:    Vital Signs:   Temp:  [97.2 F (36.2 C)-98.8 F (37.1 C)] 97.5 F (36.4 C) (02/02 0848) Pulse Rate:  [65-78] 65 (02/02 0255) Resp:  [10-18] 15 (02/02 0255) BP: (85-114)/(54-78) 97/78 (02/02 0848) SpO2:  [89 %-100 %] 96 % (02/02 0255) Last BM Date: 11/16/19  Weight change: There were no vitals filed for this visit.  Intake/Output:   Intake/Output Summary (Last 24 hours) at 11/18/2019 1036 Last data filed at 11/18/2019 0600 Gross per 24 hour  Intake 1730 ml  Output 400 ml  Net 1330 ml      Physical Exam    General: No resp difficulty HEENT: normal Neck: supple. JVP 6-7  . Carotids 2+ bilat; no bruits. No lymphadenopathy or thyromegaly appreciated. Cor: PMI nondisplaced. Irregular rate & rhythm. No rubs, gallops or murmurs. Lungs: clear Abdomen: soft, nontender, nondistended. No hepatosplenomegaly. No bruits or masses. Good bowel sounds. Extremities: no cyanosis, clubbing, rash, R and LLE 1+ edema. LUE abrasion. R hand abrasion Neuro: alert & orientedx3, cranial nerves grossly intact. moves all 4 extremities w/o difficulty. Affect pleasant   Telemetry    A fib V paced   EKG    A fib V paced 77 ppm   Labs   Basic Metabolic Panel: Recent Labs  Lab 11/13/19 1445 12/14/2019 1029 11/18/19 0809  NA 135 140 137  K 3.4* 3.3* 3.2*  CL  95* 99 98  CO2 30 26 29   GLUCOSE 90 113* 149*  BUN 57* 60*  59*  CREATININE 2.06* 2.07* 1.90*  CALCIUM 8.4* 9.3 8.9    Liver Function Tests: No results for input(s): AST, ALT, ALKPHOS, BILITOT, PROT, ALBUMIN in the last 168 hours. No results for input(s): LIPASE, AMYLASE in the last 168 hours. No results for input(s): AMMONIA in the last 168 hours.  CBC: Recent Labs  Lab 12/11/2019 1029 11/18/19 0250  WBC 10.0 10.0  NEUTROABS 8.9*  --   HGB 13.6 12.1*  HCT 43.3 37.9*  MCV 98.4 97.2  PLT 281 249    Cardiac Enzymes: Recent Labs  Lab 11/24/2019 1029 11/18/19 0250  CKTOTAL 2,260* 2,485*    BNP: BNP (last 3 results) Recent Labs    11/13/19 1445  BNP 943.0*    ProBNP (last 3 results) Recent Labs    05/27/19 1318 06/17/19 0938  PROBNP 755.0* 661.0*     CBG: No results for input(s): GLUCAP in the last 168 hours.  Coagulation Studies: No results for input(s): LABPROT, INR in the last 72 hours.   Imaging   DG Chest 1 View  Result Date: 11/24/2019 CLINICAL DATA:  Pain secondary to a fall due to syncope. EXAM: CHEST  1 VIEW COMPARISON:  04/26/2019 FINDINGS: There is a new abnormal density in the right middle lobe adjacent to the right heart border which could represent an infiltrate or mass. There is new slight atelectasis at the left base posterior medially. Heart size and pulmonary vascularity are normal. Aortic atherosclerosis. Pacemaker in place. No discrete effusions or acute bone abnormalities. IMPRESSION: New abnormal density in the right middle lobe which could represent an infiltrate or mass. CT scan of the chest with contrast is recommended for further evaluation if the patient does not have findings consistent with pneumonia. Aortic Atherosclerosis (ICD10-I70.0). Electronically Signed   By: Lorriane Shire M.D.   On: 12/04/2019 10:58   CT Head Wo Contrast  Result Date: 12/09/2019 CLINICAL DATA:  Trauma to the head and neck. EXAM: CT HEAD WITHOUT CONTRAST CT CERVICAL SPINE WITHOUT CONTRAST TECHNIQUE: Multidetector CT  imaging of the head and cervical spine was performed following the standard protocol without intravenous contrast. Multiplanar CT image reconstructions of the cervical spine were also generated. COMPARISON:  09/08/2019 FINDINGS: CT HEAD FINDINGS Brain: Age related volume loss. No evidence of old or acute focal infarction, mass lesion, hemorrhage, hydrocephalus or extra-axial collection. Vascular: No abnormal vascular finding. Skull: No skull fracture. Sinuses/Orbits: Previous functional endoscopic sinus surgery. Other: None CT CERVICAL SPINE FINDINGS Alignment: No traumatic malalignment. 2 mm degenerative anterolisthesis C4-5. Skull base and vertebrae: Benign sclerotic focus within the posterior left C5 vertebral body. No significant primary bone lesion. No evidence of regional fracture. Soft tissues and spinal canal: No traumatic finding. Disc levels: Pronounced facet arthropathy on the right at C7-T1. Pronounced facet arthropathy on the left at C6-7. Lesser facet osteoarthritis on the left at C3-4, C4-5, C5-6 and C7-T1. No evidence of compressive canal stenosis. Foraminal narrowing on the left that could be symptomatic. Upper chest: Negative Other: None IMPRESSION: Head CT: No acute or traumatic finding.  Age related atrophy. Cervical spine CT: No acute or traumatic finding. Extensive chronic facet arthropathy left more than right as outlined above. Electronically Signed   By: Nelson Chimes M.D.   On: 11/24/2019 11:12   CT Chest Wo Contrast  Result Date: 12/14/2019 CLINICAL DATA:  History of fall, anticoagulated EXAM: CT CHEST WITHOUT CONTRAST TECHNIQUE:  Multidetector CT imaging of the chest was performed following the standard protocol without IV contrast. COMPARISON:  11/28/2019 FINDINGS: Cardiovascular: Evaluation of the heart and great vessels is limited without IV contrast in the setting of trauma. There is a small pericardial effusion. Multi lead pacemaker is identified. Mild atherosclerosis of the LAD  distribution of the coronary vessels. No pathologic adenopathy. Mediastinum/Nodes: No pathologic adenopathy. Lungs/Pleura: There are bilateral pleural effusions, volume estimated less than 1 L each. Areas of compressive atelectasis are seen within the bilateral lower lobes. Dense consolidation within the medial segment right middle lobe, as well as patchy consolidation within the bilateral lower lobes, could reflect multifocal bronchopneumonia. No pneumothorax. Upper Abdomen: Trace ascites within the upper abdomen. Nodularity of the liver capsule suggests cirrhosis. Otherwise no acute process. Musculoskeletal: There are no acute displaced fractures. Reconstructed images demonstrate evidence of a prior healed mid sternal gladiolus fracture. IMPRESSION: 1. Dense consolidation medial segment right middle lobe, with more patchy consolidation in the bilateral lower lobes. Multifocal bronchopneumonia suspected. 2. Bilateral pleural effusions volume estimated less than 1 L each. 3. Trace pericardial effusion. 4. Trace ascites. 5. Capsular liver nodularity suggesting cirrhosis. Electronically Signed   By: Randa Ngo M.D.   On: 12/01/2019 15:38   CT Cervical Spine Wo Contrast  Result Date: 12/14/2019 CLINICAL DATA:  Trauma to the head and neck. EXAM: CT HEAD WITHOUT CONTRAST CT CERVICAL SPINE WITHOUT CONTRAST TECHNIQUE: Multidetector CT imaging of the head and cervical spine was performed following the standard protocol without intravenous contrast. Multiplanar CT image reconstructions of the cervical spine were also generated. COMPARISON:  09/08/2019 FINDINGS: CT HEAD FINDINGS Brain: Age related volume loss. No evidence of old or acute focal infarction, mass lesion, hemorrhage, hydrocephalus or extra-axial collection. Vascular: No abnormal vascular finding. Skull: No skull fracture. Sinuses/Orbits: Previous functional endoscopic sinus surgery. Other: None CT CERVICAL SPINE FINDINGS Alignment: No traumatic  malalignment. 2 mm degenerative anterolisthesis C4-5. Skull base and vertebrae: Benign sclerotic focus within the posterior left C5 vertebral body. No significant primary bone lesion. No evidence of regional fracture. Soft tissues and spinal canal: No traumatic finding. Disc levels: Pronounced facet arthropathy on the right at C7-T1. Pronounced facet arthropathy on the left at C6-7. Lesser facet osteoarthritis on the left at C3-4, C4-5, C5-6 and C7-T1. No evidence of compressive canal stenosis. Foraminal narrowing on the left that could be symptomatic. Upper chest: Negative Other: None IMPRESSION: Head CT: No acute or traumatic finding.  Age related atrophy. Cervical spine CT: No acute or traumatic finding. Extensive chronic facet arthropathy left more than right as outlined above. Electronically Signed   By: Nelson Chimes M.D.   On: 12/13/2019 11:12   DG Shoulder Left  Result Date: 11/30/2019 CLINICAL DATA:  Syncope with fall.  Left shoulder pain EXAM: LEFT SHOULDER - 2+ VIEW COMPARISON:  None. FINDINGS: No acute fracture or dislocation. Chronic calcification or ossicle about the spurred acromioclavicular joint, favor the latter. Minimal calcification at the greater tuberosity of the humerus, chronic appearing. Prominent degenerative facet spurring in the cervical spine. IMPRESSION: 1. Negative for fracture. 2. Small calcifications at the distal rotator cuff which could reflect calcific tendinitis. Electronically Signed   By: Monte Fantasia M.D.   On: 11/20/2019 10:56   DG Hip Unilat With Pelvis 2-3 Views Right  Result Date: 11/28/2019 CLINICAL DATA:  Right hip pain after fall. EXAM: DG HIP (WITH OR WITHOUT PELVIS) 2-3V RIGHT COMPARISON:  None. FINDINGS: There is no evidence of hip fracture or dislocation. There is  no evidence of arthropathy or other focal bone abnormality. IMPRESSION: Negative. Electronically Signed   By: Marijo Conception M.D.   On: 12/14/2019 10:59      Medications:     Current  Medications: . allopurinol  100 mg Oral BID  . apixaban  5 mg Oral BID  . finasteride  5 mg Oral q morning - 10a     Infusions: . azithromycin    . cefTRIAXone (ROCEPHIN)  IV         Assessment/Plan   1. Unwitnessed fall - ? Syncope from RV failure. - ? Orthostatic with overdiuresis  - Will ask [employer] Jude to interrogate. ---no arrhythmias on device interrogation.  - Needs Life Alert he is looking into that.   2. Multifocal PNA On ceftriaxone and azithromycin  WBC 10 Procalcitonin ordered.   3. Mild Rhabdomyolysis -Total CK 2260 >2485  - Received IV fluids but now off.   4. CKD Stage IV Creatinine baseline 1.6-1.8  Creatinine on admit 2.  - Off diuretics   5. Chronic Systolic HF , NICM  Echo in 11/20 with EF 20-25%, severe LVH, severely decreased RV systolic function  Off bb, spiro, diuretics  with hypotension.  - Will need to hold off on HF meds with hypotension.   6.Cardiac Amyloidosis.  UPEP negative 09/2019 for monoclonal light chains. 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 Anticipate starting tafamidis once approved.   7.Chronic A fib Rate controlled.  Off bb with hypotension  On eliquis   8. H/O CHB with ST jude CRT-P  Interrogation- OK . Underlying rhythm CHB.   9. DNR/DNI    Length of Stay: 0  Darrick Grinder, NP  11/18/2019, 10:36 AM  Advanced Heart Failure Team Pager [phone] (M-F; 7a - 4p)  Please contact [facility] for night-coverage after hours (4p -7a ) and weekends on [email or web address]   Patient seen and examined with the above-signed Advanced Practice Provider and/or Housestaff. I personally reviewed laboratory data, imaging studies and relevant notes. I independently examined the patient and formulated the important aspects of the plan. I have edited the note to reflect any of my changes or salient points. I have personally discussed the plan with the patient  and/or family.  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   Admitted with Rhabdo and RML PNA after having syncopal episode and lying on the floor for 9 hours. Interrogation of device shows know malignant arrhythmias.   On exam remains somewhat volume overload with R>L HF but not sure he can tolerate further diuresis with RV failure  General:  Weap appearing. No resp difficulty HEENT: normal Neck: supple. JVP to jaw Carotids 2+ bilat; no bruits. No lymphadenopathy or thryomegaly appreciated. Cor: PMI nondisplaced. Irregular rate & rhythm. No rubs, gallops or murmurs. Lungs: clear Abdomen: soft, nontender, nondistended. Liver edge down. No bruits or masses. Good bowel sounds. Extremities: no cyanosis, clubbing, rash, 2+ edema multiple ecchymosis  Neuro: alert & orientedx3, cranial nerves grossly intact. moves all 4 extremities w/o difficulty. 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 Would start midodrine. Place support hose. Tafamdis pending but suspect it may be too late to really help. Agree with IV abx for PNA. Check PCT.  Would strongly consider palliative involvement.   Glori Bickers, MD  6:33 PM

## 2019-11-18 NOTE — Progress Notes (Signed)
RT called to access patient due to desat. Patient sitting in bed, no distress, able to answer questions on [place] 4 L sats 85-88%.  RT placed patient on a [place] HFNC 7L with humidity, sats improved to 92%.  RN at bedside starting lasix.  RT will continue to monitor as needed.

## 2019-11-18 NOTE — Progress Notes (Signed)
While this nurse was unavailable noted that pt O2 sats decreased. Went to assess pt and noted pt with s.o.b.Pt sats dropped to 76.Placed on 2lnc-O2 sats increased to92% but didn't sustain. Pt sats ranged from 85-89% on 3L. RT called, MD paged. New orders received.

## 2019-11-18 NOTE — Evaluation (Addendum)
Occupational Therapy Evaluation Patient Details Name: Charles Mcmahon MRN: [id number] DOB: September 14, 1946 Today's Date: 11/18/2019    History of Present Illness 74 yo male presenting after a fall at home and patient was down for 9 hours on the floor. PMH including complete heart block status post pacemaker, persistent A. fib, chronic systolic heart failure ejection fraction 25%., and Chronic kidney disease a stage IV.   Clinical Impression   PTA, pt was living with his wife and performed ADLs with AE and used cane for mobility; daughter lives nearby and assists with IADLs and acts as caregiver for wife (who has history of CVA). Pt currently performing ADLs and functional mobility at [facility] A level with cane. Pt very motivated to participate in therapy. VSS throughout. Pt would benefit from further acute OT to facilitate safe dc and optimize return to PLOF. Recommend dc to home once medically stable per physician.    Follow Up Recommendations  No OT follow up    Equipment Recommendations  None recommended by OT    Recommendations for Other Services PT consult     Precautions / Restrictions Precautions Precautions: Fall      Mobility Bed Mobility               General bed mobility comments: Sitting in recliner upon arrival  Transfers Overall transfer level: Needs assistance Equipment used: None Transfers: Sit to/from Stand Sit to Stand: Supervision;Min guard         General transfer comment: Supervision-Min Guard A for safety    Balance Overall balance assessment: Mild deficits observed, not formally tested                                         ADL either performed or assessed with clinical judgement   ADL Overall ADL's : Needs assistance/impaired Eating/Feeding: Independent   Grooming: Oral care;Set up;Supervision/safety;Standing Grooming Details (indicate cue type and reason): Pt performing oral care at sink with  supervision Upper Body Bathing: Set up;Supervision/ safety;Sitting   Lower Body Bathing: Min guard;Sit to/from stand   Upper Body Dressing : Set up;Supervision/safety   Lower Body Dressing: Min guard;Sit to/from stand   Toilet Transfer: Supervision/safety;Min guard;Ambulation(simulated to recliner)           Functional mobility during ADLs: Min guard;Cane General ADL Comments: Pt performing ADLs and functional mobility at [employer] A level with use of cane. Pt requiring increased time due to soreness at BLE (RLE>LLE).      Vision Baseline Vision/History: Wears glasses Wears Glasses: At all times Patient Visual Report: No change from baseline       Perception     Praxis      Pertinent Vitals/Pain Pain Assessment: Faces Faces Pain Scale: Hurts even more Pain Location: RLE Pain Descriptors / Indicators: Constant;Grimacing;Discomfort Pain Intervention(s): Monitored during session;Limited activity within patient's tolerance;Repositioned     Hand Dominance Right   Extremity/Trunk Assessment Upper Extremity Assessment Upper Extremity Assessment: Overall WFL for tasks assessed   Lower Extremity Assessment Lower Extremity Assessment: Defer to PT evaluation   Cervical / Trunk Assessment Cervical / Trunk Assessment: Normal   Communication Communication Communication: HOH(Wears hearing aides)   Cognition Arousal/Alertness: Awake/alert Behavior During Therapy: WFL for tasks assessed/performed Overall Cognitive Status: Within Functional Limits for tasks assessed  General Comments: Very motivated to participate in therapy   General Comments  SpO2 90s on RA. HR 90s. BP 92/62 and 88/67    Exercises     Shoulder Instructions      Home Living Family/patient expects to be discharged to:: Private residence Living Arrangements: Spouse/significant other Available Help at Discharge: Family Type of Home: House Home  Access: Level entry     Home Layout: One level     Bathroom Shower/Tub: Tub/shower unit;Walk-in shower   Bathroom Toilet: Standard     Home Equipment: Walker - 2 wheels;Grab bars - tub/shower;Shower seat;Bedside commode;Hospital bed;Cane - quad   Additional Comments: Spouse s/p CVA and requires caregiver      Prior Functioning/Environment Level of Independence: Independent with assistive device(s)        Comments: uses cane for mobility. Performs BADLs with AE and light IADLs. daughter comes and assists with spouse and IADLs        OT Problem List: Decreased activity tolerance;Decreased knowledge of use of DME or AE;Decreased safety awareness;Decreased strength;Pain      OT Treatment/Interventions: Self-care/ADL training;Therapeutic activities;Therapeutic exercise;Patient/family education;DME and/or AE instruction    OT Goals(Current goals can be found in the care plan section) Acute Rehab OT Goals Patient Stated Goal: "Figure out these medications" OT Goal Formulation: With patient Time For Goal Achievement: 12/02/19 Potential to Achieve Goals: Good  OT Frequency: Min 2X/week   Barriers to D/C:            Co-evaluation PT/OT/SLP Co-Evaluation/Treatment: (Dove tail for activity tolerance)            AM-PAC OT "6 Clicks" Daily Activity     Outcome Measure Help from another person eating meals?: None Help from another person taking care of personal grooming?: None Help from another person toileting, which includes using toliet, bedpan, or urinal?: A Little Help from another person bathing (including washing, rinsing, drying)?: A Little Help from another person to put on and taking off regular upper body clothing?: None Help from another person to put on and taking off regular lower body clothing?: A Little 6 Click Score: 21   End of Session Equipment Utilized During Treatment: Other (comment);Gait belt(cane) Nurse Communication: Mobility status  Activity  Tolerance: Patient tolerated treatment well Patient left: Other (comment)(in hallway with PT)  OT Visit Diagnosis: Unsteadiness on feet (R26.81);Muscle weakness (generalized) (M62.81);Pain Pain - Right/Left: Right Pain - part of body: Leg                Time: 4287-6811 OT Time Calculation (min): 21 min Charges:  OT General Charges $OT Visit: 1 Visit OT Evaluation $OT Eval Low Complexity: 1 Low  Ulisses Vondrak MSOT, OTR/L Acute Rehab Pager: [phone] Office: [place] 11/18/2019, 10:10 AM

## 2019-11-18 NOTE — Progress Notes (Signed)
Received patient at 2255 from ED on RA. R sided pacemaker noted. Pt. AV paced. Placed on monitor. BP stable. Pt. Complaining of 7/10 pain in neck, groin and shoulder. Fentanyl ordered once with resolution of pain.   Pt. Up to chair this AM with x1 assist.

## 2019-11-19 DIAGNOSIS — I5043 Acute on chronic combined systolic (congestive) and diastolic (congestive) heart failure: Secondary | ICD-10-CM

## 2019-11-19 DIAGNOSIS — I4821 Permanent atrial fibrillation: Secondary | ICD-10-CM

## 2019-11-19 DIAGNOSIS — N179 Acute kidney failure, unspecified: Secondary | ICD-10-CM | POA: Diagnosis present

## 2019-11-19 DIAGNOSIS — R748 Abnormal levels of other serum enzymes: Secondary | ICD-10-CM

## 2019-11-19 LAB — COMPREHENSIVE METABOLIC PANEL
ALT: 46 U/L — ABNORMAL HIGH (ref 0–44)
AST: 120 U/L — ABNORMAL HIGH (ref 15–41)
Albumin: 2.2 g/dL — ABNORMAL LOW (ref 3.5–5.0)
Alkaline Phosphatase: 320 U/L — ABNORMAL HIGH (ref 38–126)
Anion gap: 12 (ref 5–15)
BUN: 57 mg/dL — ABNORMAL HIGH (ref 8–23)
CO2: 27 mmol/L (ref 22–32)
Calcium: 8.8 mg/dL — ABNORMAL LOW (ref 8.9–10.3)
Chloride: 101 mmol/L (ref 98–111)
Creatinine, Ser: 1.74 mg/dL — ABNORMAL HIGH (ref 0.61–1.24)
GFR calc Af Amer: 44 mL/min — ABNORMAL LOW (ref >60)
GFR calc non Af Amer: 38 mL/min — ABNORMAL LOW (ref >60)
Glucose, Bld: 102 mg/dL — ABNORMAL HIGH (ref 70–99)
Potassium: 4.7 mmol/L (ref 3.5–5.1)
Sodium: 140 mmol/L (ref 135–145)
Total Bilirubin: 1.2 mg/dL (ref 0.3–1.2)
Total Protein: 4.8 g/dL — ABNORMAL LOW (ref 6.5–8.1)

## 2019-11-19 LAB — PROCALCITONIN: Procalcitonin: 0.87 ng/mL

## 2019-11-19 LAB — CK: Total CK: 1236 U/L — ABNORMAL HIGH (ref 49–397)

## 2019-11-19 LAB — GAMMA GT: GGT: 467 U/L — ABNORMAL HIGH (ref 7–50)

## 2019-11-19 LAB — MAGNESIUM: Magnesium: 2.2 mg/dL (ref 1.7–2.4)

## 2019-11-19 MED ORDER — AZITHROMYCIN 250 MG PO TABS
250.0000 mg | ORAL_TABLET | ORAL | Status: AC
  Administered 2019-11-20 – 2019-11-21 (×2): 250 mg via ORAL
  Filled 2019-11-19 (×2): qty 1

## 2019-11-19 NOTE — Progress Notes (Signed)
PROGRESS NOTE  Charles Mcmahon:[id number] DOB: 1946-09-30   PCP: [name], [name], MD  Patient is from: home.  Uses cane at baseline.  DOA: 12/10/2019 LOS: 1  Brief Narrative / Interim history: 74 year old with history of complete heart block status post pacemaker, persistent A. fib, systolic CHF EF 31%, CKD stage IV presented with fall found to be in rhabdo.  CT showed dense consolidation in the right middle and bilateral lower lobes.  Diagnosed with community-acquired pneumonia started on Rocephin and azithromycin as well.  Subjective: No major events overnight or this morning.  Concerned about left arm wound.  Reports right foot pain.  Denies chest pain or dyspnea.  Denies GI or UTI symptoms.  Objective: Vitals:   11/18/19 1748 11/18/19 1945 11/19/19 0320 11/19/19 0804  BP: (!) 87/73   (!) 97/58  Pulse: 73     Resp: 18   11  Temp:  98.7 F (37.1 C) 98.7 F (37.1 C) 97.9 F (36.6 C)  TempSrc:  Oral Oral Oral  SpO2: 91%     Height:       No intake or output data in the 24 hours ending 11/19/19 1225 There were no vitals filed for this visit.  Examination:  GENERAL: No acute distress.  Appears well.  HEENT: MMM.  Vision and hearing grossly intact.  NECK: Supple.  No apparent JVD.  RESP:  No IWOB. Good air movement bilaterally. CVS:  RRR. Heart sounds normal.  ABD/GI/GU: Bowel sounds present. Soft. Non tender.  MSK/EXT:  Moves extremities. Bilateral lower extremity swelling.  SKIN: Left upper extremity wound as below. NEURO: Awake, alert and oriented appropriately.  No apparent focal neuro deficit. PSYCH: Calm. Normal affect.     Procedures:  None  Assessment & Plan: Fall of unknown etiology at home: No arrhythmia on pacemaker interrogation.  Concern about orthostatic hypotension.  CT head without acute finding.  No focal neuro finding on exam.  Patient uses cane at baseline.  Denies history of recurrent fall. -PT/OT -Fall precautions  Rhabdomyolysis: Due to  the above.  CK 2260> 2485 -Continue monitoring -IV fluid off in the setting of CHF.  Multifocal community-acquired pneumonia: Procalcitonin elevated.  COVID-19 negative.  CXR concerning for RML and bilateral LLL pneumonia. -Continue ceftriaxone and azithromycin. -Continue breathing treatments  Acute metabolic encephalopathy: seems to have resolved.  CT negative, B12 and TSH not revealing. -Treat treatable causes.  Chronic systolic: Echo in 54/0086 with EF of 20 to 25%, severe LVH, severely decreased RVSP -I&O incomplete.  Renal function improving. -Advanced heart failure team managing-currently off diuretics or GDMT due to hypotension. -Continue monitoring fluid status and renal function.  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 -Off BB due to hypotension. -Continue Eliquis for anticoagulation.  CKD-4: Baseline creatinine 1.7> 2.06 (admit)> 1.74: Likely combination of prerenal from hypotension and ATN from rhabdo -Continue monitoring.  Anemia of chronic disease: Hgb relatively stable. -Check anemia panel  Elevated liver enzymes: Likely due to rhabdomyolysis. -Continue monitoring.  Left upper extremity wound: POA -Wound care consult.   Goal of care: Patient is DNR/DNI appropriately.  As significant comorbidities as above.  Cardiology recommending palliative care consult -Palliative care consulted.               DVT prophylaxis: On Eliquis for A. fib. Code Status: DNR/DNI Family Communication: Patient and/or RN. Available  if any question. Disposition Plan: Remains inpatient pending clearance by AHF team, improvement in rhabdo Consultants: Advanced heart failure team, palliative care   Microbiology summarized: Influenza PCR negative. COVID-19 PCR negative.  Sch Meds:   Scheduled Meds: . allopurinol  100 mg Oral BID  . apixaban  5 mg Oral BID  . finasteride  5 mg Oral q morning - 10a  . midodrine  5 mg Oral TID WC   Continuous Infusions: . azithromycin 500 mg (11/18/19 1641)  . cefTRIAXone (ROCEPHIN)  IV 1 g (11/18/19 1558)   PRN Meds:.acetaminophen, ipratropium-albuterol, ondansetron **OR** ondansetron (ZOFRAN) IV, polyethylene glycol, senna-docusate, traMADol  Antimicrobials: Anti-infectives (From admission, onward)   Start     Dose/Rate Route Frequency Ordered Stop   11/18/19 1700  azithromycin (ZITHROMAX) 500 mg in sodium chloride 0.9 % 250 mL IVPB     500 mg 250 mL/hr over 60 Minutes Intravenous Every 24 hours 11/27/2019 1645     11/18/19 1600  cefTRIAXone (ROCEPHIN) 1 g in sodium chloride 0.9 % 100 mL IVPB     1 g 200 mL/hr over 30 Minutes Intravenous Every 24 hours 12/14/2019 1645     12/02/2019 1545  cefTRIAXone (ROCEPHIN) 1 g in sodium chloride 0.9 % 100 mL IVPB     1 g 200 mL/hr over 30 Minutes Intravenous  Once 11/18/2019 1542 12/04/2019 1713   11/18/2019 1545  azithromycin (ZITHROMAX) 500 mg in sodium chloride 0.9 % 250 mL IVPB     500 mg 250 mL/hr over 60 Minutes Intravenous  Once 12/14/2019 1542 11/28/2019 2051       I have personally reviewed the following labs and images: CBC: Recent Labs  Lab 12/06/2019 1029 11/18/19 0250  WBC 10.0 10.0  NEUTROABS 8.9*  --   HGB 13.6 12.1*  HCT 43.3 37.9*  MCV 98.4 97.2  PLT 281 249   BMP &GFR Recent Labs  Lab 11/13/19 1445 12/13/2019 1029 11/18/19 0809 11/19/19 0237  NA 135 140 137 140  K 3.4* 3.3* 3.2* 4.7  CL 95* 99 98 101  CO2 30 26 29 27   GLUCOSE 90 113* 149* 102*  BUN 57* 60* 59* 57*  CREATININE 2.06* 2.07* 1.90* 1.74*  CALCIUM 8.4* 9.3 8.9 8.8*  MG  --   --   --  2.2   Estimated Creatinine Clearance: 37.2 mL/min (A) (by C-G formula based on SCr of 1.74 mg/dL (H)). Liver & Pancreas: Recent Labs  Lab 11/19/19 0237  AST 120*  ALT 46*  ALKPHOS 320*  BILITOT 1.2  PROT 4.8*   ALBUMIN 2.2*   No results for input(s): LIPASE, AMYLASE in the last 168 hours. No results for input(s): AMMONIA in the last 168 hours. Diabetic: No results for input(s): HGBA1C in the last 72 hours. No results for input(s): GLUCAP in the last 168 hours. Cardiac Enzymes: Recent Labs  Lab 12/06/2019 1029 11/18/19 0250  CKTOTAL 2,260* 2,485*   Recent Labs    05/27/19 1318 06/17/19 0938  PROBNP 755.0* 661.0*   Coagulation Profile: No results for input(s): INR, PROTIME in the last 168 hours. Thyroid Function Tests: Recent Labs    11/18/19 0251  TSH 2.140   Lipid Profile: No results for input(s): CHOL, HDL, LDLCALC, TRIG, CHOLHDL, LDLDIRECT in the last 72 hours. Anemia Panel: Recent Labs    11/18/19 0250  VITAMINB12 714   Urine analysis:    Component Value Date/Time   COLORURINE YELLOW 09/08/2019 [place] 09/08/2019 0947   LABSPEC 1.021 09/08/2019 0947  [place] 5.0 09/08/2019 [place] 09/08/2019 [place] 05/27/2019 1318   [place] 09/08/2019 [place] 09/08/2019 [place] 09/08/2019 0947   PROTEINUR 30 (A) 09/08/2019 0947   UROBILINOGEN 0.2 05/27/2019 1318   NITRITE NEGATIVE 09/08/2019 0947   LEUKOCYTESUR NEGATIVE 09/08/2019 0947   Sepsis Labs: Invalid input(s): PROCALCITONIN, [place]  Microbiology: Recent Results (from the past 240 hour(s))  Respiratory Panel by RT PCR (Flu A&B, Covid) - Nasopharyngeal Swab     Status: None   Collection Time: 12/08/2019  3:51 PM   Specimen: Nasopharyngeal Swab  Result Value Ref Range Status   SARS Coronavirus 2 by RT PCR NEGATIVE NEGATIVE Final    Comment: (NOTE) SARS-CoV-2 target nucleic acids are NOT DETECTED. The SARS-CoV-2 RNA is generally detectable in upper respiratoy specimens during the acute phase of infection. The lowest concentration of SARS-CoV-2 viral copies this assay can detect is 131 copies/mL. A negative result  does not preclude SARS-Cov-2 infection and should not be used as the sole basis for treatment or other patient management decisions. A negative result may occur with  improper specimen collection/handling, submission of specimen other than nasopharyngeal swab, presence of viral mutation(s) within the areas targeted by this assay, and inadequate number of viral copies (<131 copies/mL). A negative result must be combined with clinical observations, patient history, and epidemiological information. The expected result is Negative. Fact Sheet for Patients:  PinkCheek.be Fact Sheet for Healthcare Providers:  GravelBags.it This test is not yet ap proved or cleared by the [place] FDA and  has been authorized for detection and/or diagnosis of SARS-CoV-2 by FDA under an Emergency Use Authorization (EUA). This EUA will remain  in effect (meaning this test can be used) for the duration of the COVID-19 declaration under Section 564(b)(1) of the Act, 21 U.S.C. section 360bbb-3(b)(1), unless the authorization is terminated or revoked sooner.    Influenza A by PCR NEGATIVE NEGATIVE Final   Influenza B by PCR NEGATIVE NEGATIVE Final    Comment: (NOTE) The Xpert Xpress SARS-CoV-2/FLU/RSV assay is intended as an aid in  the diagnosis of influenza from Nasopharyngeal swab specimens and  should not be used as a sole basis for treatment. Nasal washings and  aspirates are unacceptable for Xpert Xpress SARS-CoV-2/FLU/RSV  testing. Fact Sheet for Patients: PinkCheek.be Fact Sheet for Healthcare Providers: GravelBags.it This test is not yet approved or cleared by the [place] FDA and  has been authorized for detection and/or diagnosis of SARS-CoV-2 by  FDA under an Emergency Use Authorization (EUA). This EUA will remain  in effect (meaning this test can be used) for the duration of  the  Covid-19 declaration under Section 564(b)(1) of the Act, 21  U.S.C. section 360bbb-3(b)(1), unless the authorization is  terminated or revoked. Performed at [facility], [place] [address], [place], [place] [address]     Radiology Studies: No results found.    Jameal Razzano T. [place]  If 7PM-7AM, please contact night-coverage [email or web address] Password [facility] 11/19/2019, 12:25 PM

## 2019-11-19 NOTE — Progress Notes (Signed)
Wound Orthopedic Tech Progress Note Patient Details:  Charles Mcmahon 07/29/1946 [id number]  Patient ID: [name], male   DOB: July 30, 1946, 74 y.o.   MRN: [id number]   [name] 11/19/2019, 6:18 PMWound care has to be completed by nurse before unna boots can be applied. Ortho techs do not remove unna boots, clean leg or wound care anymore. Hospital realized these duties are out of our scope of practice. Please call when unna boots are ready to be applied.

## 2019-11-19 NOTE — Consult Note (Signed)
WOC Nurse Consult Note: Patient receiving care in [facility] 2W11. Reason for Consult: LUE wound Wound type: old skin tear with bruising along border of old adhesive bandaid patient applied about 3 days ago at home. Pressure Injury POA: Yes/No/NA Measurement: Wound bed: pink Drainage (amount, consistency, odor) serosanginous to existing bandaid Periwound: intact Dressing procedure/placement/frequency: Cleanse right antecubital wound (old skin tear) with soap and water.  Pat dry. Apply a foam dressing to the site. Change every 3 days and prn.  Apply foam dressings to the scabbed areas on the RFA, change every 3 days.  These topical dressings were applied today at the time of my visit. Monitor the wound area(s) for worsening of condition such as: Signs/symptoms of infection,  Increase in size,  Development of or worsening of odor, Development of pain, or increased pain at the affected locations.  Notify the medical team if any of these develop.  Thank you for the consult.  Discussed plan of care with the patient and bedside nurse.  [place] nurse will not follow at this time.  Please re-consult the [place] team if needed.  Val Riles, RN, MSN, CWOCN, CNS-BC, pager [phone]

## 2019-11-19 NOTE — Progress Notes (Signed)
Pt c/o pain during IV infusion of Azithromycin. Infusion stopped and IV flushed without issue. MD paged to notify of inability to complete infusion. PRN pain med administered per [facility]. Ice pack offered to assist with pain relief. Will continue to monitor.

## 2019-11-19 NOTE — Progress Notes (Signed)
Physical Therapy Treatment Patient Details Name: Charles Mcmahon MRN: [id number] DOB: Jun 07, 1946 Today's Date: 11/19/2019    History of Present Illness Pt is a 74 y.o. male admitted 12/03/2019 after fall at home, found down after ~9 hrs on floor, pt does not remember cause of fall; worked up for mild rhabdomyolysis. Head CT negative for acute abnormality; unable to do MRI secondary to pacemaker. CXR with suspected multifocal PNA. PMH includes nonischemic cardiomyopathy with history of complete heart block s/p pacemaker, CKD IV, afib, CAD, R TKR.    PT Comments    Pt continues with c/o soreness/stiffness t/o body most likely due to fall and laying on ground for 9+ hours. Pt with noted bilat LE and feet swelling also contributing to stiffness. Pt with increased ambulation tolerance this date. Due to LE swelling pt unable to bend knees enough to don socks, must use reacher. Pt educated on ROM to bilat LEs and ankles to help with the management of stiffness and swelling. Acute PT to cont to follow to progress mobility.     Follow Up Recommendations  No PT follow up;Supervision - Intermittent     Equipment Recommendations  None recommended by PT    Recommendations for Other Services       Precautions / Restrictions Precautions Precautions: Fall;Other (comment) Precaution Comments: Hypotension (asymptomatic) Restrictions Weight Bearing Restrictions: No    Mobility  Bed Mobility Overal bed mobility: Modified Independent             General bed mobility comments: HOB elevated, pt has hospital bed at home, increased time due to stiffness  Transfers Overall transfer level: Needs assistance Equipment used: Straight cane Transfers: Sit to/from Stand Sit to Stand: Min guard         General transfer comment: min guard for saftey due to c/o pain and stiffness, pt guarded, slow to stand all the way up, no physical assist needed  Ambulation/Gait Ambulation/Gait assistance: Min  guard Gait Distance (Feet): 120 Feet Assistive device: Straight cane Gait Pattern/deviations: Step-through pattern;Decreased stride length;Antalgic Gait velocity: dec Gait velocity interpretation: 1.31 - 2.62 ft/sec, indicative of limited community ambulator General Gait Details: Slow, antalgic gait with cane and initial min guard for balance, progressing to supervision; intermittent standing breaks to stretch R thigh secondary to c/o tightness. Pt reports stiffness improving with gait, pt states "this is way to long" "that was alotDealer    Modified Rankin (Stroke Patients Only)       Balance Overall balance assessment: Needs assistance   Sitting balance-Leahy Scale: Good       Standing balance-Leahy Scale: Fair Standing balance comment: pt requires cane for safe ambulation, static standing balance is good                            Cognition Arousal/Alertness: Awake/alert Behavior During Therapy: WFL for tasks assessed/performed Overall Cognitive Status: Within Functional Limits for tasks assessed                                 General Comments: very motivated however irritated over soreness      Exercises      General Comments General comments (skin integrity, edema, etc.): pt with L elbow and R hand lacerations from fall, RN notified, pt with bilat LE and feet swelling  Pertinent Vitals/Pain Pain Assessment: Faces Faces Pain Scale: Hurts even more Pain Location: R LE Pain Descriptors / Indicators: Tightness;Grimacing Pain Intervention(s): Monitored during session    Home Living                      Prior Function            PT Goals (current goals can now be found in the care plan section) Progress towards PT goals: Progressing toward goals    Frequency    Min 3X/week      PT Plan Current plan remains appropriate    Co-evaluation              AM-PAC PT  "6 Clicks" Mobility   Outcome Measure  Help needed turning from your back to your side while in a flat bed without using bedrails?: None Help needed moving from lying on your back to sitting on the side of a flat bed without using bedrails?: None Help needed moving to and from a bed to a chair (including a wheelchair)?: None Help needed standing up from a chair using your arms (e.g., wheelchair or bedside chair)?: None Help needed to walk in hospital room?: A Little Help needed climbing 3-5 steps with a railing? : A Little 6 Click Score: 22    End of Session Equipment Utilized During Treatment: Gait belt Activity Tolerance: Patient tolerated treatment well Patient left: in chair;with call bell/phone within reach;with chair alarm set Nurse Communication: Mobility status PT Visit Diagnosis: Other abnormalities of gait and mobility (R26.89)     Time: 3794-3276 PT Time Calculation (min) (ACUTE ONLY): 34 min  Charges:  $Gait Training: 23-37 mins                     Kittie Plater, PT, DPT Acute Rehabilitation Services Pager #: [phone] Office #: [phone]    [place] L[place] 11/19/2019, 9:29 AM

## 2019-11-19 NOTE — Plan of Care (Signed)

## 2019-11-19 NOTE — Progress Notes (Addendum)
Advanced Heart Failure Rounding Note  PCP-Cardiologist: Buford Dresser, MD   Subjective:    Yesterday started on midodrine 5 mg three times a day and diuretics held. SBP remains in the 90s.   Able to walk with PT 120 feet min guard.   Denies SOB. Denies dizziness.    Objective:   Weight Range:   Body mass index is 24.54 kg/m.   Vital Signs:   Temp:  [97.9 F (36.6 C)-98.7 F (37.1 C)] 97.9 F (36.6 C) (02/03 0804) Pulse Rate:  [69-92] 73 (02/02 1748) Resp:  [10-18] 11 (02/03 0804) BP: (86-101)/(58-79) 97/58 (02/03 0804) SpO2:  [91 %-98 %] 91 % (02/02 1748) Last BM Date: 11/16/19  Weight change: There were no vitals filed for this visit.  Intake/Output:  No intake or output data in the 24 hours ending 11/19/19 1323    Physical Exam    General:  No resp difficulty HEENT: Normal Neck: Supple. JVP 11-12 . Carotids 2+ bilat; no bruits. No lymphadenopathy or thyromegaly appreciated. Cor: PMI nondisplaced. Irregular rate & rhythm. No rubs, gallops or murmurs. Lungs: Clear Abdomen: Soft, nontender, nondistended. No hepatosplenomegaly. No bruits or masses. Good bowel sounds. Extremities: No cyanosis, clubbing, rash, R and LLE 1-2+ edema Neuro: Alert & orientedx3, cranial nerves grossly intact. moves all 4 extremities w/o difficulty. Affect pleasant   Telemetry   A sensed V paced 70s   EKG    N/a   Labs    CBC Recent Labs    12/05/2019 1029 11/18/19 0250  WBC 10.0 10.0  NEUTROABS 8.9*  --   HGB 13.6 12.1*  HCT 43.3 37.9*  MCV 98.4 97.2  PLT 281 448   Basic Metabolic Panel Recent Labs    11/18/19 0809 11/19/19 0237  NA 137 140  K 3.2* 4.7  CL 98 101  CO2 29 27  GLUCOSE 149* 102*  BUN 59* 57*  CREATININE 1.90* 1.74*  CALCIUM 8.9 8.8*  MG  --  2.2   Liver Function Tests Recent Labs    11/19/19 0237  AST 120*  ALT 46*  ALKPHOS 320*  BILITOT 1.2  PROT 4.8*  ALBUMIN 2.2*   No results for input(s): LIPASE, AMYLASE in the last  72 hours. Cardiac Enzymes Recent Labs    12/13/2019 1029 11/18/19 0250  CKTOTAL 2,260* 2,485*    BNP: BNP (last 3 results) Recent Labs    11/13/19 1445  BNP 943.0*    ProBNP (last 3 results) Recent Labs    05/27/19 1318 06/17/19 0938  PROBNP 755.0* 661.0*     D-Dimer No results for input(s): DDIMER in the last 72 hours. Hemoglobin A1C No results for input(s): HGBA1C in the last 72 hours. Fasting Lipid Panel No results for input(s): CHOL, HDL, LDLCALC, TRIG, CHOLHDL, LDLDIRECT in the last 72 hours. Thyroid Function Tests Recent Labs    11/18/19 0251  TSH 2.140    Other results:   Imaging     No results found.   Medications:     Scheduled Medications: . allopurinol  100 mg Oral BID  . apixaban  5 mg Oral BID  . finasteride  5 mg Oral q morning - 10a  . midodrine  5 mg Oral TID WC     Infusions: . azithromycin 500 mg (11/18/19 1641)  . cefTRIAXone (ROCEPHIN)  IV 1 g (11/18/19 1558)     PRN Medications:  acetaminophen, ipratropium-albuterol, ondansetron **OR** ondansetron (ZOFRAN) IV, polyethylene glycol, senna-docusate, traMADol     Assessment/Plan  1. Unwitnessed fall - ? Syncope from RV failure. - ? Orthostatic with overdiuresis  - Will ask [employer] to interrogate.  - no arrhythmias on device interrogation.  - Needs Life Alert he is looking into that.   2. Multifocal PNA On ceftriaxone and azithromycin  WBC 10 Procalcitonin 0.87 .   3. Mild Rhabdomyolysis -Total CK 2260 >2485  - Received IV fluids but now off.   4. CKD Stage IV Creatinine baseline 1.6-1.8  Creatinine on admit 2. Todays creatinine is 1.74 - Off diuretics will need to restart tomorrow.   5. Chronic Systolic HF ,Biventricular HF,  NICM  Echo in 11/20 with EF 20-25%, severe LVH, severely decreased RV systolic function  Off bb, spiro, diuretics  with hypotension.  - Continue midodrine 5 mg three times a day.   - Tomorrow will start torsemide 40 mg daily.  Will try once daily diuretics. I would not favor twice daily. He has had chronic lower extremity edema due to venous insufficiency/HF  but hopefully we can manage with elevation and compression wraps. Doubt he can apply compression stockings at home   - Needs daily weights. Ordered daily but not recorded.   6.Cardiac Amyloidosis.  UPEP negative 09/2019 for monoclonal light chains. 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 Anticipate starting tafamidis once approved.  - On midodrine to help BP.   7.Chronic A fib Rate controlled.  Off bb with hypotension  On eliquis   8. H/O CHB with ST jude CRT-P  Interrogation- OK . Underlying rhythm CHB.   9. DNR/DNI   Length of Stay: 1  Amy Clegg, NP  11/19/2019, 1:23 PM  Advanced Heart Failure Team Pager [phone] (M-F; 7a - 4p)  Please contact [facility] for night-coverage after hours (4p -7a ) and weekends on [email or web address]  Patient seen with NP, agree with the above note.   Unwitnessed fall with possible syncope prior to admission, unable to get off ground due to shoulder pain, was down for about 9 hrs with mild rhabdomyolysis.  Device interrogation showed no arrhythmia. SBP in 90s now.  He was able to walk in the hall today.   PCT 0.87, PNA on CT chest.  He is on ceftriaxone azithromycin.   General: NAD Neck: JVP 10-12 cm, no thyromegaly or thyroid nodule.  Lungs: Mild crackles at bases.  CV: Nondisplaced PMI.  Heart regular S1/S2, no S3/S4, 2/6 HSM LLSB.  2+ edema to knees.   Abdomen: Soft, nontender, no hepatosplenomegaly, no distention.  Skin: Intact without lesions or rashes.  Neurologic: Alert and oriented x 3.  Psych: Normal affect. Extremities: No clubbing or cyanosis.  HEENT: Normal.   1. Unwitnessed fall with suspected syncope: No arrhythmias.  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  - Check orthostatic.  - Needs Unna boots for legs.  - Agree with continuing midodrine 5 mg tid.  2. PNA: Noted on CT.  PCT mildly elevated at 0.87.  WBCs not elevated.  - Ceftriaxone/azithromycin.  3. Chronic atrial fibrillation: Rate is reasonably controlled. He has underlying CHB with St Jude CRT-P.  - Continue Eliquis.  - Off Coreg with low BP.  4. Acute on chronic systolic CHF: Echo 41/74 with EF 20-25%, severe LVH, severe RV dysfunction.  Biventricular failure.  He is volume overloaded, difficult  to manage with RV failure.  He has a Physiological scientist.  - Cardio-active meds on hold with low BP.  - Diuretics on hold with initial low BP, mild rhabdomyolysis, and AKI.  - Will restart torsemide tomorrow.  - Unna boots.  5. AKI on CKD stage IV: Creatinine back to baseline 1.8.  6. 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  Negative urine immunofixation and myeloma panel. 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  - Will plan to start tafamidis when approved, but may be too late to have much effect at this point.  - Midodrine to help with orthostasis.   DNR/DNI.   Mobilize.   Loralie Champagne 11/19/2019 2:58 PM

## 2019-11-20 LAB — CK: Total CK: 612 U/L — ABNORMAL HIGH (ref 49–397)

## 2019-11-20 LAB — BASIC METABOLIC PANEL
Anion gap: 12 (ref 5–15)
BUN: 48 mg/dL — ABNORMAL HIGH (ref 8–23)
CO2: 26 mmol/L (ref 22–32)
Calcium: 8.8 mg/dL — ABNORMAL LOW (ref 8.9–10.3)
Chloride: 99 mmol/L (ref 98–111)
Creatinine, Ser: 1.49 mg/dL — ABNORMAL HIGH (ref 0.61–1.24)
GFR calc Af Amer: 53 mL/min — ABNORMAL LOW (ref >60)
GFR calc non Af Amer: 46 mL/min — ABNORMAL LOW (ref >60)
Glucose, Bld: 126 mg/dL — ABNORMAL HIGH (ref 70–99)
Potassium: 4 mmol/L (ref 3.5–5.1)
Sodium: 137 mmol/L (ref 135–145)

## 2019-11-20 LAB — MAGNESIUM: Magnesium: 2.5 mg/dL — ABNORMAL HIGH (ref 1.7–2.4)

## 2019-11-20 LAB — COMPREHENSIVE METABOLIC PANEL
ALT: 50 U/L — ABNORMAL HIGH (ref 0–44)
AST: 118 U/L — ABNORMAL HIGH (ref 15–41)
Albumin: 2.4 g/dL — ABNORMAL LOW (ref 3.5–5.0)
Alkaline Phosphatase: 411 U/L — ABNORMAL HIGH (ref 38–126)
Anion gap: 13 (ref 5–15)
BUN: 53 mg/dL — ABNORMAL HIGH (ref 8–23)
CO2: 22 mmol/L (ref 22–32)
Calcium: 8.8 mg/dL — ABNORMAL LOW (ref 8.9–10.3)
Chloride: 100 mmol/L (ref 98–111)
Creatinine, Ser: 1.65 mg/dL — ABNORMAL HIGH (ref 0.61–1.24)
GFR calc Af Amer: 47 mL/min — ABNORMAL LOW (ref >60)
GFR calc non Af Amer: 40 mL/min — ABNORMAL LOW (ref >60)
Glucose, Bld: 100 mg/dL — ABNORMAL HIGH (ref 70–99)
Potassium: 5.7 mmol/L — ABNORMAL HIGH (ref 3.5–5.1)
Sodium: 135 mmol/L (ref 135–145)
Total Bilirubin: 1.6 mg/dL — ABNORMAL HIGH (ref 0.3–1.2)
Total Protein: 4.9 g/dL — ABNORMAL LOW (ref 6.5–8.1)

## 2019-11-20 MED ORDER — FUROSEMIDE 10 MG/ML IJ SOLN: 80.0000 mg | Freq: Two times a day (BID) | INTRAMUSCULAR | Status: DC

## 2019-11-20 MED ORDER — FUROSEMIDE 10 MG/ML IJ SOLN
80.0000 mg | Freq: Two times a day (BID) | INTRAMUSCULAR | Status: DC
  Administered 2019-11-20 – 2019-11-22 (×5): 80 mg via INTRAVENOUS
  Filled 2019-11-20 (×6): qty 8

## 2019-11-20 NOTE — Progress Notes (Signed)
Occupational Therapy Treatment Patient Details Name: Charles Mcmahon MRN: [id number] DOB: 11-05-1945 Today's Date: 11/20/2019    History of present illness Pt is a 74 y.o. male admitted 12/08/2019 after fall at home, found down after ~9 hrs on floor, pt does not remember cause of fall; worked up for mild rhabdomyolysis. Head CT negative for acute abnormality; unable to do MRI secondary to pacemaker. CXR with suspected multifocal PNA. PMH includes nonischemic cardiomyopathy with history of complete heart block s/p pacemaker, CKD IV, afib, CAD, R TKR.   OT comments  Patient progressing slowly towards goals.  He is limited today by L knee pain, required use of RW during in room mobility.  Min guard for transfers and mobility due to pain, steadying assist required.  Grooming at sink with close supervision.  Provided heat pack for L knee and RN aware of pain (but patient already received tylenol).  Will follow acutely.    Follow Up Recommendations  No OT follow up    Equipment Recommendations  None recommended by OT    Recommendations for Other Services PT consult    Precautions / Restrictions Precautions Precautions: Fall;Other (comment) Precaution Comments: Hypotension (asymptomatic) Restrictions Weight Bearing Restrictions: No       Mobility Bed Mobility Overal bed mobility: Modified Independent             General bed mobility comments: HOB elevated, pt has hospital bed at home, increased time due to stiffness  Transfers Overall transfer level: Needs assistance Equipment used: Straight cane Transfers: Sit to/from Stand Sit to Stand: Min guard         General transfer comment: min guard for safety, increased time and effort with minimal steadying due to L knee pain     Balance Overall balance assessment: Needs assistance Sitting-balance support: No upper extremity supported;Feet supported Sitting balance-Leahy Scale: Good     Standing balance support: Bilateral upper  extremity supported;During functional activity Standing balance-Leahy Scale: Fair Standing balance comment: requires BUE dynamically today                            ADL either performed or assessed with clinical judgement   ADL Overall ADL's : Needs assistance/impaired     Grooming: Standing;Supervision/safety;Oral care               Lower Body Dressing: Min guard;Sit to/from stand;With adaptive equipment Lower Body Dressing Details (indicate cue type and reason): pt using reacher to doff socks and don slippers, min guard sit to stand  Toilet Transfer: Min guard;Ambulation;RW           Functional mobility during ADLs: Min guard;Rolling walker General ADL Comments: pt limited by pain in L knee, initated using cane but patient reliant on BUE support and transitioned to [employer]     Praxis      Cognition Arousal/Alertness: Awake/alert Behavior During Therapy: WFL for tasks assessed/performed Overall Cognitive Status: Within Functional Limits for tasks assessed                                          Exercises     Shoulder Instructions       General Comments      Pertinent Vitals/ Pain       Pain Assessment: Faces Faces  Pain Scale: Hurts whole lot Pain Location: L knee  Pain Descriptors / Indicators: Discomfort;Sore;Grimacing;Guarding Pain Intervention(s): Monitored during session;Repositioned;Heat applied  Home Living                                          Prior Functioning/Environment              Frequency  Min 2X/week        Progress Toward Goals  OT Goals(current goals can now be found in the care plan section)  Progress towards OT goals: Progressing toward goals  Acute Rehab OT Goals Patient Stated Goal: less pain in my knee OT Goal Formulation: With patient  Plan Discharge plan remains appropriate;Frequency needs to be updated    Co-evaluation                  AM-PAC OT "6 Clicks" Daily Activity     Outcome Measure   Help from another person eating meals?: None Help from another person taking care of personal grooming?: None Help from another person toileting, which includes using toliet, bedpan, or urinal?: A Little Help from another person bathing (including washing, rinsing, drying)?: A Little Help from another person to put on and taking off regular upper body clothing?: None Help from another person to put on and taking off regular lower body clothing?: A Little 6 Click Score: 21    End of Session Equipment Utilized During Treatment: Gait belt;Rolling walker  OT Visit Diagnosis: Unsteadiness on feet (R26.81);Muscle weakness (generalized) (M62.81);Pain Pain - Right/Left: Right Pain - part of body: Leg   Activity Tolerance Patient limited by pain   Patient Left in chair;with call bell/phone within reach;with chair alarm set   Nurse Communication Mobility status;Patient requests pain meds        Time: 6283-6629 OT Time Calculation (min): 37 min  Charges: OT General Charges $OT Visit: 1 Visit OT Treatments $Self Care/Home Management : 23-37 mins  Jolaine Artist, OT Acute Rehabilitation Services Pager [phone] Office [place] 11/20/2019, 10:00 AM

## 2019-11-20 NOTE — TOC Initial Note (Signed)
Transition of Care [facility]) - Initial/Assessment Note    Patient Details  Name: Charles Mcmahon MRN: [id number] Date of Birth: 1946-05-07  [facility] of [facility] CM/SW Contact:    Carles Collet, RN Phone Number: 11/20/2019, 11:50 AM  Clinical Narrative:            Charles Mcmahon w patient who deferred to daughter Charles Mcmahon. Patient lives at home w wife who has had a stroke. I anticipate patient will need [place] services for [employer], and after speaking w daughter also SW and HHA. We discussed providers/ coverage/ ratings and she would like [facility]. Requested either RN or PT to cover Unna boots and SW and HHA. No DME needs per daughter. Transportation resources provided to [employer] via e-mail.  Charles Mcmahon concerned w patient's initial confusion, read today's progress note that states that he has cleared, she expressed relief.  HH SW to assist with possible referrals to ALF. Also discussed w Charles Mcmahon private duty care resources and cost and licensed vs unlicensed help from friends or church members.          Expected Discharge Plan: [place] Barriers to Discharge: Continued Medical Work up   Patient Goals and CMS Choice Patient states their goals for this hospitalization and ongoing recovery are:: to go home CMS Medicare.gov Compare Post Acute Care list provided to:: Patient Choice offered to / list presented to : Adult Children  Expected Discharge Plan and Services Expected Discharge Plan: [place]     Post Acute Care Choice: Home Health                             HH Arranged: RN, PT, Nurse's Aide, Social Work [place] Date [place]: 11/20/19 Time [place]: 1150 Representative spoke with at [place] Arrangements/Services                       Activities of [place] Devices/Equipment: None ADL Screening (condition at time of admission) Patient's cognitive ability  adequate to safely complete daily activities?: No Is the patient deaf or have difficulty hearing?: No Does the patient have difficulty seeing, even when wearing glasses/contacts?: No Does the patient have difficulty concentrating, remembering, or making decisions?: No Patient able to express need for assistance with ADLs?: No Does the patient have difficulty dressing or bathing?: No Independently performs ADLs?: Yes (appropriate for developmental age) Does the patient have difficulty walking or climbing stairs?: No Weakness of Legs: None Weakness of Arms/Hands: None  Permission Sought/Granted                  Emotional Assessment              Admission diagnosis:  Pneumonia [J18.9] AKI (acute kidney injury) ([place]) [N17.9] Fall, initial encounter [W19.XXXA] Traumatic rhabdomyolysis, initial encounter ([place]) [T79.6XXA] Syncope, unspecified syncope type [R55] Community acquired pneumonia, unspecified laterality [J18.9] Fall against sharp object, sequela [W01.119S] Patient Active Problem List   Diagnosis Date Noted  . AKI (acute kidney injury) ([place])   . Acute on chronic combined systolic and diastolic CHF (congestive heart failure) ([place])   . Fall against sharp object, sequela 11/18/2019  . Rhabdomyolysis 12/07/2019  . Fall at home, initial encounter 11/28/2019  . Pneumonia 12/14/2019  . Abnormal LFTs (liver function tests) 11/03/2019  . TIA (transient ischemic attack) 11/03/2019  . HFrEF (  heart failure with reduced ejection fraction) ([place]) 11/03/2019  . Acute encephalopathy 09/07/2019  . Chronic heart failure ([place]) 08/07/2019  . 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 Heart block AV complete ([place]) 08/07/2019  . 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 Nonischemic cardiomyopathy ([place]) 08/07/2019  . CKD (chronic kidney disease) stage 4, GFR 15-29 ml/min (HCC) 08/07/2019  . Hypertension with heart disease 06/17/2019  . Elevated  alkaline phosphatase level 06/17/2019  . Kidney disease 06/17/2019  . Permanent atrial fibrillation ([place]) 06/17/2019   PCP:  [name], [name], MD Pharmacy:   [id number] - [place], [place] - [address] [place] [address] Phone: [phone] Fax: [phone]     Social Determinants of Health (SDOH) Interventions    Readmission Risk Interventions No flowsheet data found.

## 2019-11-20 NOTE — Plan of Care (Signed)

## 2019-11-20 NOTE — Progress Notes (Signed)
Physical Therapy Treatment Patient Details Name: Charles Mcmahon MRN: [id number] DOB: 07/29/1946 Today's Date: 11/20/2019    History of Present Illness Pt is a 74 y.o. male admitted 12/11/2019 after fall at home, found down after ~9 hrs on floor, pt does not remember cause of fall; worked up for mild rhabdomyolysis. Head CT negative for acute abnormality; unable to do MRI secondary to pacemaker. CXR with suspected multifocal PNA. PMH includes nonischemic cardiomyopathy with history of complete heart block s/p pacemaker, CKD IV, afib, CAD, R TKR.   PT Comments    Pt limited by increased L knee this session; reports RLE soreness from fall slowly improving; discussed continued BLE therex/gentle ROM. Able to mobilize short distance, reliant on RW to offload painful LLE; reports having RW at home to use if needed. Pt with BLE swelling; orthopedic tech present to don unna boots, therefore RN to measure orthostatic BP this afternoon. Pt denies dizziness with mobility this session.   Follow Up Recommendations  No PT follow up;Supervision - Intermittent     Equipment Recommendations  None recommended by PT    Recommendations for Other Services       Precautions / Restrictions Precautions Precautions: Fall;Other (comment) Precaution Comments: Hypotension (asymptomatic); bilateral unna boots Restrictions Weight Bearing Restrictions: No    Mobility  Bed Mobility               General bed mobility comments: Received sitting in recliner  Transfers Overall transfer level: Needs assistance Equipment used: Rolling walker (2 wheeled) Transfers: Sit to/from Stand Sit to Stand: Supervision         General transfer comment: Use of RW secondary to significant L knee pain; increased time and effort for LE positioning in preparation to stand, no physical assist required  Ambulation/Gait Ambulation/Gait assistance: Supervision Gait Distance (Feet): 60 Feet Assistive device: Rolling walker (2  wheeled) Gait Pattern/deviations: Step-through pattern;Decreased stride length;Antalgic;Decreased weight shift to left   Gait velocity interpretation: <1.31 ft/sec, indicative of household ambulator General Gait Details: Slow, antalgic gait in room with RW at supervision-level; use of RW to offload painful LLE. Pt guarded with pain, demonstrates good stability with BUE support   Stairs             Wheelchair Mobility    Modified Rankin (Stroke Patients Only)       Balance Overall balance assessment: Needs assistance Sitting-balance support: No upper extremity supported;Feet supported Sitting balance-Leahy Scale: Good       Standing balance-Leahy Scale: Fair Standing balance comment: Can static stand without UE support to urinate at toilet; reliant on UE support for dynamic stability                            Cognition Arousal/Alertness: Awake/alert Behavior During Therapy: WFL for tasks assessed/performed Overall Cognitive Status: Within Functional Limits for tasks assessed                                        Exercises Other Exercises Other Exercises: Seated gentle knee AROM    General Comments General comments (skin integrity, edema, etc.): Ortho tech present to don unna boots at end of session      Pertinent Vitals/Pain Pain Assessment: Faces Faces Pain Scale: Hurts even more Pain Location: L knee  Pain Descriptors / Indicators: Discomfort;Sore;Grimacing;Guarding Pain Intervention(s): Monitored during session;Limited activity within patient's tolerance  Home Living                      Prior Function            PT Goals (current goals can now be found in the care plan section) Progress towards PT goals: Progressing toward goals(although limited by increased L knee pain this session)    Frequency    Min 3X/week      PT Plan Current plan remains appropriate    Co-evaluation              AM-PAC  PT "6 Clicks" Mobility   Outcome Measure  Help needed turning from your back to your side while in a flat bed without using bedrails?: None Help needed moving from lying on your back to sitting on the side of a flat bed without using bedrails?: None Help needed moving to and from a bed to a chair (including a wheelchair)?: None Help needed standing up from a chair using your arms (e.g., wheelchair or bedside chair)?: None Help needed to walk in hospital room?: A Little Help needed climbing 3-5 steps with a railing? : A Little 6 Click Score: 22    End of Session   Activity Tolerance: Patient limited by pain Patient left: in bed;with call bell/phone within reach(seated EOB with ortho tech to don unna boots) Nurse Communication: Mobility status PT Visit Diagnosis: Other abnormalities of gait and mobility (R26.89)     Time: 4037-0964 PT Time Calculation (min) (ACUTE ONLY): 15 min  Charges:  $Therapeutic Exercise: 8-22 mins                    Mabeline Caras, PT, DPT Acute Rehabilitation Services  Pager [phone] Office [place] 11/20/2019, 1:03 PM

## 2019-11-20 NOTE — Progress Notes (Signed)
PROGRESS NOTE  Charles Mcmahon YWV:[id number] DOB: Feb 15, 1946   PCP: [name], [name], MD  Patient is from: home.  Uses cane at baseline.  DOA: 12/09/2019 LOS: 2  Brief Narrative / Interim history: 74 year old with history of complete heart block status post pacemaker, persistent A. fib, systolic CHF EF 85%, CKD stage IV presented with fall found to be in rhabdo.  CT showed dense consolidation in the right middle and bilateral lower lobes.  Diagnosed with community-acquired pneumonia started on Rocephin and azithromycin as well.  Subjective: No major events overnight or this morning.  Feels some stiffness in his left knee.  Right leg pain improved.  Denies chest pain, dyspnea, cough, GI or UTI symptoms.  Objective: Vitals:   11/19/19 1823 11/19/19 2327 11/20/19 0340 11/20/19 0733  BP:  101/74  103/73  Pulse:  73  71  Resp: (!) 24 20  16   Temp:  98.7 F (37.1 C)  (!) 97.5 F (36.4 C)  TempSrc:      SpO2:  96% 96% 91%  Height:        Intake/Output Summary (Last 24 hours) at 11/20/2019 1026 Last data filed at 11/19/2019 1738 Gross per 24 hour  Intake 450 ml  Output -  Net 450 ml   There were no vitals filed for this visit.  Examination:  GENERAL: No apparent distress.  Nontoxic.  Sitting on bedside chair. HEENT: MMM.  Vision and hearing grossly intact.  NECK: Supple.  No apparent JVD.  RESP: On RA.  No IWOB. Good air movement bilaterally. CVS:  RRR. Heart sounds normal.  ABD/GI/GU: Bowel sounds present. Soft. Non tender.  MSK/EXT:  Moves extremities.  LE edema/hypertrophy SKIN: Left upper extremity wound and bruise over bilateral knees NEURO: Awake, alert and oriented appropriately.  No apparent focal neuro deficit. PSYCH: Calm. Normal affect.     Procedures:  None  Assessment & Plan: Fall of unknown etiology at home: No arrhythmia on pacemaker interrogation.  Concern about orthostatic hypotension.  CT head without acute finding.  No focal neuro finding on exam.   Patient uses cane at baseline.  Denies history of recurrent fall. -PT/OT-recommended intermittent supervision. -Fall precautions  Rhabdomyolysis: Due to the above.  CK 2260> 2485> 612 -Continue monitoring -IV fluid off in the setting of CHF.  Multifocal community-acquired pneumonia: Procalcitonin elevated.  COVID-19 negative.  CXR concerning for RML and bilateral LLL pneumonia. -Ceftriaxone and azithromycin 2/1>> -Continue breathing treatments  Acute metabolic encephalopathy: seems to have resolved.  CT negative, B12 and TSH not revealing. -Treat treatable causes.  Chronic systolic: Echo in 46/2703 with EF of 20 to 25%, severe LVH, severely decreased RVSP.  -I&O incomplete.  Renal function improving. -HF team managing-currently off diuretics or GDMT due to hypotension requiring midodrine. -Continue monitoring fluid status and renal function.  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 -Off BB due to hypotension. -Continue Eliquis for anticoagulation.  CKD-4: Baseline creatinine 1.7> 2.06 (admit)> 1.74> 1.65: Likely combination of prerenal from hypotension and ATN from rhabdo -Continue monitoring. -On midodrine for hypotension.  Anemia of chronic disease: Hgb relatively stable. -Monitor intermittently.  Elevated LFTs/ALP/total bili: Likely due to rhabdomyolysis.  No GI symptoms. -Continue monitoring.  Left upper extremity wound: POA -Appreciate help by wound care nurse   Goal of care: Patient is DNR/DNI appropriately.  As significant comorbidities as above.  Cardiology recommending  palliative care consult -Palliative care consulted.               DVT prophylaxis: On Eliquis for A. fib. Code Status: DNR/DNI Family Communication: Patient and/or RN. Available if any question. Disposition Plan: Remains  inpatient pending clearance by AHF team, improvement in rhabdo Consultants: Advanced heart failure team, palliative care   Microbiology summarized: Influenza PCR negative. COVID-19 PCR negative.  Sch Meds:  Scheduled Meds: . allopurinol  100 mg Oral BID  . apixaban  5 mg Oral BID  . azithromycin  250 mg Oral Q24H  . finasteride  5 mg Oral q morning - 10a  . midodrine  5 mg Oral TID WC   Continuous Infusions: . cefTRIAXone (ROCEPHIN)  IV 1 g (11/19/19 1705)   PRN Meds:.acetaminophen, ipratropium-albuterol, ondansetron **OR** ondansetron (ZOFRAN) IV, polyethylene glycol, senna-docusate, traMADol  Antimicrobials: Anti-infectives (From admission, onward)   Start     Dose/Rate Route Frequency Ordered Stop   11/20/19 1800  azithromycin (ZITHROMAX) tablet 250 mg     250 mg Oral Every 24 hours 11/19/19 1855 11/22/19 1759   11/18/19 1700  azithromycin (ZITHROMAX) 500 mg in sodium chloride 0.9 % 250 mL IVPB  Status:  Discontinued     500 mg 250 mL/hr over 60 Minutes Intravenous Every 24 hours 12/06/2019 1645 11/19/19 1855   11/18/19 1600  cefTRIAXone (ROCEPHIN) 1 g in sodium chloride 0.9 % 100 mL IVPB     1 g 200 mL/hr over 30 Minutes Intravenous Every 24 hours 12/11/2019 1645     12/11/2019 1545  cefTRIAXone (ROCEPHIN) 1 g in sodium chloride 0.9 % 100 mL IVPB     1 g 200 mL/hr over 30 Minutes Intravenous  Once 11/24/2019 1542 12/02/2019 1713   11/24/2019 1545  azithromycin (ZITHROMAX) 500 mg in sodium chloride 0.9 % 250 mL IVPB     500 mg 250 mL/hr over 60 Minutes Intravenous  Once 11/24/2019 1542 11/28/2019 2051       I have personally reviewed the following labs and images: CBC: Recent Labs  Lab 12/07/2019 1029 11/18/19 0250  WBC 10.0 10.0  NEUTROABS 8.9*  --   HGB 13.6 12.1*  HCT 43.3 37.9*  MCV 98.4 97.2  PLT 281 249   BMP &GFR Recent Labs  Lab 11/13/19 1445 12/14/2019 1029 11/18/19 0809 11/19/19 0237 11/20/19 0259  NA 135 140 137 140 135  K 3.4* 3.3* 3.2* 4.7 5.7*  CL 95* 99  98 101 100  CO2 30 26 29 27 22   GLUCOSE 90 113* 149* 102* 100*  BUN 57* 60* 59* 57* 53*  CREATININE 2.06* 2.07* 1.90* 1.74* 1.65*  CALCIUM 8.4* 9.3 8.9 8.8* 8.8*  MG  --   --   --  2.2 2.5*   Estimated Creatinine Clearance: 39.3 mL/min (A) (by C-G formula based on SCr of 1.65 mg/dL (H)). Liver & Pancreas: Recent Labs  Lab 11/19/19 0237 11/20/19 0259  AST 120* 118*  ALT 46* 50*  ALKPHOS 320* 411*  BILITOT 1.2 1.6*  PROT 4.8* 4.9*  ALBUMIN 2.2* 2.4*   No results for input(s): LIPASE, AMYLASE in the last 168 hours. No results for input(s): AMMONIA in the last 168 hours. Diabetic: No results for input(s): HGBA1C in the last 72 hours. No results for input(s): GLUCAP in the last 168 hours. Cardiac Enzymes: Recent Labs  Lab 12/13/2019 1029 11/18/19 0250 11/19/19 1245 11/20/19 0259  CKTOTAL 2,260* 2,485* 1,236* 612*   Recent Labs    05/27/19 1318 06/17/19 5631  PROBNP 755.0* 661.0*   Coagulation Profile: No results for input(s): INR, PROTIME in the last 168 hours. Thyroid Function Tests: Recent Labs    11/18/19 0251  TSH 2.140   Lipid Profile: No results for input(s): CHOL, HDL, LDLCALC, TRIG, CHOLHDL, LDLDIRECT in the last 72 hours. Anemia Panel: Recent Labs    11/18/19 0250  VITAMINB12 714   Urine analysis:    Component Value Date/Time   COLORURINE YELLOW 09/08/2019 [place] 09/08/2019 0947   LABSPEC 1.021 09/08/2019 0947   PHURINE 5.0 09/08/2019 0947   GLUCOSEU NEGATIVE 09/08/2019 0947   GLUCOSEU NEGATIVE 05/27/2019 1318   HGBUR NEGATIVE 09/08/2019 [place] 09/08/2019 0947   KETONESUR NEGATIVE 09/08/2019 0947   PROTEINUR 30 (A) 09/08/2019 0947   UROBILINOGEN 0.2 05/27/2019 1318   NITRITE NEGATIVE 09/08/2019 0947   LEUKOCYTESUR NEGATIVE 09/08/2019 0947   Sepsis Labs: Invalid input(s): PROCALCITONIN, [place]  Microbiology: Recent Results (from the past 240 hour(s))  Respiratory Panel by RT PCR (Flu A&B,  Covid) - Nasopharyngeal Swab     Status: None   Collection Time: 12/10/2019  3:51 PM   Specimen: Nasopharyngeal Swab  Result Value Ref Range Status   SARS Coronavirus 2 by RT PCR NEGATIVE NEGATIVE Final    Comment: (NOTE) SARS-CoV-2 target nucleic acids are NOT DETECTED. The SARS-CoV-2 RNA is generally detectable in upper respiratoy specimens during the acute phase of infection. The lowest concentration of SARS-CoV-2 viral copies this assay can detect is 131 copies/mL. A negative result does not preclude SARS-Cov-2 infection and should not be used as the sole basis for treatment or other patient management decisions. A negative result may occur with  improper specimen collection/handling, submission of specimen other than nasopharyngeal swab, presence of viral mutation(s) within the areas targeted by this assay, and inadequate number of viral copies (<131 copies/mL). A negative result must be combined with clinical observations, patient history, and epidemiological information. The expected result is Negative. Fact Sheet for Patients:  PinkCheek.be Fact Sheet for Healthcare Providers:  GravelBags.it This test is not yet ap proved or cleared by the [place] FDA and  has been authorized for detection and/or diagnosis of SARS-CoV-2 by FDA under an Emergency Use Authorization (EUA). This EUA will remain  in effect (meaning this test can be used) for the duration of the COVID-19 declaration under Section 564(b)(1) of the Act, 21 U.S.C. section 360bbb-3(b)(1), unless the authorization is terminated or revoked sooner.    Influenza A by PCR NEGATIVE NEGATIVE Final   Influenza B by PCR NEGATIVE NEGATIVE Final    Comment: (NOTE) The Xpert Xpress SARS-CoV-2/FLU/RSV assay is intended as an aid in  the diagnosis of influenza from Nasopharyngeal swab specimens and  should not be used as a sole basis for treatment. Nasal washings and   aspirates are unacceptable for Xpert Xpress SARS-CoV-2/FLU/RSV  testing. Fact Sheet for Patients: PinkCheek.be Fact Sheet for Healthcare Providers: GravelBags.it This test is not yet approved or cleared by the [place] FDA and  has been authorized for detection and/or diagnosis of SARS-CoV-2 by  FDA under an Emergency Use Authorization (EUA). This EUA will remain  in effect (meaning this test can be used) for the duration of the  Covid-19 declaration under Section 564(b)(1) of the Act, 21  U.S.C. section 360bbb-3(b)(1), unless the authorization is  terminated or revoked. Performed at [facility], [place] [address] [place], [place] [address]     Radiology Studies: No results found.  Taye T. [place]  If 7PM-7AM, please contact night-coverage [email or web address] Password [email or web address] 11/20/2019, 10:26 AM

## 2019-11-20 NOTE — Progress Notes (Addendum)
Advanced Heart Failure Rounding Note  PCP-Cardiologist: Buford Dresser, MD   Subjective:    SBP in the low 100s. Orthostatic vital signs ordered but I do not see that this has been completed. Still feels dizzy immediately w/ standing but resolves pretty quickly. Appears to be improving per pt.   On abx for PNA. AF. Denies SOB.   AKI improving, SCr 1.90>>1.74>>1.65. K elevated at 5.7.  Remains off of HF meds/diuretics. Daily wts not being followed. Remains edematous. No unna boots yet.   Complains of left knee pain after fall.    Objective:   Weight Range:   Body mass index is 24.54 kg/m.   Vital Signs:   Temp:  [97.5 F (36.4 C)-98.7 F (37.1 C)] 97.5 F (36.4 C) (02/04 0733) Pulse Rate:  [71-73] 71 (02/04 0733) Resp:  [16-24] 16 (02/04 0733) BP: (101-106)/(62-74) 103/73 (02/04 0733) SpO2:  [91 %-96 %] 91 % (02/04 0733) Last BM Date: 11/16/19  Weight change: There were no vitals filed for this visit.  Intake/Output:   Intake/Output Summary (Last 24 hours) at 11/20/2019 1045 Last data filed at 11/19/2019 1738 Gross per 24 hour  Intake 450 ml  Output --  Net 450 ml      Physical Exam    General:  Elderly WM No resp difficulty HEENT: Normal Neck: Supple. Elevated JVD to ear . Carotids 2+ bilat; no bruits. No lymphadenopathy or thyromegaly appreciated. Cor: PMI nondisplaced. Irregular rate & rhythm. No rubs, gallops or murmurs. Lungs: Clear Abdomen: Soft, nontender, nondistended. No hepatosplenomegaly. No bruits or masses. Good bowel sounds. Extremities: No cyanosis, clubbing, rash, RUE edematous (no erythema), 2+ bilateral LEE  Neuro: Alert & orientedx3, cranial nerves grossly intact. moves all 4 extremities w/o difficulty. Affect pleasant   Telemetry   A sensed V paced 70s   EKG    N/a   Labs    CBC Recent Labs    11/18/19 0250  WBC 10.0  HGB 12.1*  HCT 37.9*  MCV 97.2  PLT 350   Basic Metabolic Panel Recent Labs     11/19/19 0237 11/20/19 0259  NA 140 135  K 4.7 5.7*  CL 101 100  CO2 27 22  GLUCOSE 102* 100*  BUN 57* 53*  CREATININE 1.74* 1.65*  CALCIUM 8.8* 8.8*  MG 2.2 2.5*   Liver Function Tests Recent Labs    11/19/19 0237 11/20/19 0259  AST 120* 118*  ALT 46* 50*  ALKPHOS 320* 411*  BILITOT 1.2 1.6*  PROT 4.8* 4.9*  ALBUMIN 2.2* 2.4*   No results for input(s): LIPASE, AMYLASE in the last 72 hours. Cardiac Enzymes Recent Labs    11/18/19 0250 11/19/19 1245 11/20/19 0259  CKTOTAL 2,485* 1,236* 612*    BNP: BNP (last 3 results) Recent Labs    11/13/19 1445  BNP 943.0*    ProBNP (last 3 results) Recent Labs    05/27/19 1318 06/17/19 0938  PROBNP 755.0* 661.0*     D-Dimer No results for input(s): DDIMER in the last 72 hours. Hemoglobin A1C No results for input(s): HGBA1C in the last 72 hours. Fasting Lipid Panel No results for input(s): CHOL, HDL, LDLCALC, TRIG, CHOLHDL, LDLDIRECT in the last 72 hours. Thyroid Function Tests Recent Labs    11/18/19 0251  TSH 2.140    Other results:   Imaging    No results found.   Medications:     Scheduled Medications: . allopurinol  100 mg Oral BID  . apixaban  5 mg  Oral BID  . azithromycin  250 mg Oral Q24H  . finasteride  5 mg Oral q morning - 10a  . midodrine  5 mg Oral TID WC    Infusions: . cefTRIAXone (ROCEPHIN)  IV 1 g (11/19/19 1705)    PRN Medications: acetaminophen, ipratropium-albuterol, ondansetron **OR** ondansetron (ZOFRAN) IV, polyethylene glycol, senna-docusate, traMADol     Assessment/Plan   1. Unwitnessed fall with suspected syncope: No arrhythmias.  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  - Need to check orthostatics (personally talked w/ RN and PT and instructed to complete today)  - Needs Unna boots for  legs. Discussed w/ RN. - Continue midodrine 5 mg tid for BP support. 2. PNA: Noted on CT.  PCT mildly elevated at 0.87.  WBCs not elevated. AF. - Continue Ceftriaxone/azithromycin.  3. Chronic atrial fibrillation: Rate is reasonably controlled. He has underlying CHB with St Jude CRT-P.  - Continue Eliquis.  - Off Coreg with low BP.  4. Acute on chronic systolic CHF: Echo 25/36 with EF 20-25%, severe LVH, severe RV dysfunction.  Biventricular failure.  He is volume overloaded, difficult to manage with RV failure.  He has a Physiological scientist.  - Cardio-active meds on hold with low BP.  - Diuretics held on admit with initial low BP, mild rhabdomyolysis, and AKI (improving).  - He is edematous. Need to resume diuretics.   - Start Lasix 80 mg bid.  - Place bilateral Unna boots.  - Will order strict I/Os and daily wts  5. AKI on CKD stage IV: Improving, down from 2.0>>1.65 today. Baseline ~ 1.8.  - monitor w/ restart of diuretics 6. 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  Negative urine immunofixation and myeloma panel. 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  - Will plan to start tafamidis when approved, but may be too late to have much effect at this point.  - Midodrine to help with orthostasis.  7. Hyperkalemia: K 5.7 on am labs. Spironolactone on hold. ? If hemolyzed sample. Rhythm stable on tele. - resume diuretics 80 IV lasix bid and repeat BMP at 1300.  7.Code Status: DNR/DNI  Length of Stay: 2  Lyda Jester, PA-C  11/20/2019, 10:45 AM  Advanced Heart Failure Team Pager [phone] (M-F; 7a - 4p)  Please contact [facility] for night-coverage after hours (4p -7a ) and weekends on [email or web address]  Patient seen with PA, agree with the above note.   I/Os not recorded, no weights, no orthostatics.    Patient feels ok, SBP 100s, no  lightheadedness.  Creatinine lower.    On exam, JVP 14+ cm, irregular S1S2, 2+ edema to thighs.   BP seems to have stabilized on midodrine and creatinine has trended down.  He is markedly volume overloaded at this point.  - Lasix 80 mg IV bid, 2 doses today.  - Discussed with nurse I/Os etc.   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   Loralie Champagne 11/20/2019 12:47 PM

## 2019-11-20 NOTE — Progress Notes (Signed)
Orthopedic Tech Progress Note Patient Details:  Charles Mcmahon 02-08-1946 [id number]  Ortho Devices Type of Ortho Device: Haematologist Ortho Device/Splint Location: bilateral legs Ortho Device/Splint Interventions: Ordered, Application   Post Interventions Patient Tolerated: Well Instructions Provided: Care of device, Adjustment of device   Janit Pagan 11/20/2019, 11:40 AM

## 2019-11-20 NOTE — Progress Notes (Signed)
Palliative-  Thank you for this consult. Chart reviewed- meeting planned with patient's daughter tomorrow at [place], AGNP-C Palliative Medicine  Please call Palliative Medicine team phone with any questions [phone]. For individual providers please see AMION.  No charge

## 2019-11-21 ENCOUNTER — Encounter (HOSPITAL_COMMUNITY): Payer: Self-pay

## 2019-11-21 ENCOUNTER — Other Ambulatory Visit (HOSPITAL_COMMUNITY): Payer: Medicare Other

## 2019-11-21 ENCOUNTER — Other Ambulatory Visit (HOSPITAL_COMMUNITY): Payer: Self-pay

## 2019-11-21 ENCOUNTER — Telehealth (HOSPITAL_COMMUNITY): Payer: Self-pay | Admitting: Pharmacy Technician

## 2019-11-21 DIAGNOSIS — Z7189 Other specified counseling: Secondary | ICD-10-CM

## 2019-11-21 DIAGNOSIS — Z515 Encounter for palliative care: Secondary | ICD-10-CM

## 2019-11-21 DIAGNOSIS — J189 Pneumonia, unspecified organism: Secondary | ICD-10-CM | POA: Diagnosis present

## 2019-11-21 DIAGNOSIS — Z66 Do not resuscitate: Secondary | ICD-10-CM | POA: Diagnosis present

## 2019-11-21 LAB — COMPREHENSIVE METABOLIC PANEL
ALT: 47 U/L — ABNORMAL HIGH (ref 0–44)
AST: 75 U/L — ABNORMAL HIGH (ref 15–41)
Albumin: 2.4 g/dL — ABNORMAL LOW (ref 3.5–5.0)
Alkaline Phosphatase: 484 U/L — ABNORMAL HIGH (ref 38–126)
Anion gap: 13 (ref 5–15)
BUN: 46 mg/dL — ABNORMAL HIGH (ref 8–23)
CO2: 25 mmol/L (ref 22–32)
Calcium: 8.8 mg/dL — ABNORMAL LOW (ref 8.9–10.3)
Chloride: 98 mmol/L (ref 98–111)
Creatinine, Ser: 1.47 mg/dL — ABNORMAL HIGH (ref 0.61–1.24)
GFR calc Af Amer: 54 mL/min — ABNORMAL LOW (ref >60)
GFR calc non Af Amer: 46 mL/min — ABNORMAL LOW (ref >60)
Glucose, Bld: 113 mg/dL — ABNORMAL HIGH (ref 70–99)
Potassium: 3.9 mmol/L (ref 3.5–5.1)
Sodium: 136 mmol/L (ref 135–145)
Total Bilirubin: 1 mg/dL (ref 0.3–1.2)
Total Protein: 5.1 g/dL — ABNORMAL LOW (ref 6.5–8.1)

## 2019-11-21 LAB — HEMOGLOBIN AND HEMATOCRIT, BLOOD
HCT: 39.3 % (ref 39.0–52.0)
Hemoglobin: 12.2 g/dL — ABNORMAL LOW (ref 13.0–17.0)

## 2019-11-21 LAB — MAGNESIUM: Magnesium: 2.2 mg/dL (ref 1.7–2.4)

## 2019-11-21 LAB — URIC ACID: Uric Acid, Serum: 6.4 mg/dL (ref 3.7–8.6)

## 2019-11-21 LAB — CK: Total CK: 315 U/L (ref 49–397)

## 2019-11-21 MED ORDER — MIDODRINE HCL 5 MG PO TABS
10.0000 mg | ORAL_TABLET | Freq: Three times a day (TID) | ORAL | Status: DC
  Administered 2019-11-21 – 2019-11-22 (×4): 10 mg via ORAL
  Filled 2019-11-21 (×5): qty 2

## 2019-11-21 MED ORDER — POTASSIUM CHLORIDE CRYS ER 20 MEQ PO TBCR
40.0000 meq | EXTENDED_RELEASE_TABLET | Freq: Once | ORAL | Status: AC
  Administered 2019-11-21: 40 meq via ORAL
  Filled 2019-11-21: qty 2

## 2019-11-21 MED ORDER — LORAZEPAM 1 MG PO TABS: 1.0000 mg | ORAL_TABLET | ORAL | Status: DC | PRN

## 2019-11-21 MED ORDER — OXYCODONE HCL 20 MG/ML PO CONC
5.0000 mg | ORAL | Status: DC | PRN
  Administered 2019-11-21 – 2019-11-22 (×3): 5 mg via ORAL
  Filled 2019-11-21 (×3): qty 1

## 2019-11-21 MED ORDER — COLCHICINE 0.6 MG PO TABS
0.6000 mg | ORAL_TABLET | Freq: Every day | ORAL | Status: DC
  Administered 2019-11-21 – 2019-11-22 (×2): 0.6 mg via ORAL
  Filled 2019-11-21 (×3): qty 1

## 2019-11-21 NOTE — Consult Note (Addendum)
Consultation Note Date: 11/21/2019   Patient Name: Charles Mcmahon  DOB: [birth year]  MRN: [id number]  Age / Sex: 74 y.o., male  PCP: [name], [name], MD Referring Physician: Mercy Riding, MD  Reason for Consultation: Establishing goals of care  HPI/Patient Profile: 74 y.o. 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 He was prolonged down and workup revealed rhabdomyelosis. CT chest showed dense consolidation in the R middle and bilateral lower lobes being treated for pneumonia. Palliative medicine consulted for goals of care.    Clinical Assessment and Goals of Care:  I have reviewed medical records including EPIC notes, labs and imaging, received report from Dr. Aundra Dubin, and met at the bedside with patient and his daughter Lattie Haw- to discuss diagnosis prognosis, [facility], EOL wishes, and options.  I introduced Palliative Medicine as specialized medical care for people living with serious illness. It focuses on providing relief from the symptoms and stress of a serious illness.   We discussed a brief life review of the patient. He has been previously living at home with his spouse who has some significant cognitive deficits due to history of stroke.   As far as functional and nutritional status he is significantly declined in his ability to care for himself.    We discussed his current illness and what it means in the larger context of his on-going co-morbidities.  Natural disease trajectory and expectations at EOL were discussed.  [name] easily states he is at end of life and doesn't want further aggressive measures. He states last year he tried on his own to stop taking all of his medications and just focus on quality, however, this did not work out for  him.   He states he is not getting better, his quality of life is not improving.   The difference between aggressive medical intervention and comfort care was considered in light of the patient's goals of care. [name] states his goals would be to discharge from the hospital with focus on his comfort and supporting through natural dying process. He and his daughter are familiar with Hospice and their services (his spouse was a Merchandiser, retail).   Symptom wise he has a great amount of pain in his legs. Pain with any touch or movement. He is fatigued. He has used lorazepam before and reports relief with this.   As far as disposition- his daughter and he agree that he cannot safely return home. His daughter has been researching assisted living options and would like assistance and any support that can be offered in helping getting him into one. This was discussed with case manager Sam- and she stated that [facility] does not assist with assisted living options. I don't think he is eligible for residential hospice and rehab/SNF would not be in line with his goals of care.   Questions and concerns were  addressed.  The family was encouraged to call with questions or concerns.   [name] with support of daughter [name]    SUMMARY OF RECOMMENDATIONS -GOC- treat what is treatable during hospitalization and then discharge with Hospice -Disposition will likely be complicated- preference would be assisted living with Hospice- [name] has been researching assisted living facilities and is going to start that process, SNF/Rehab is not in line with his GOC and he is not yet residential hospice eligible- they do not believe he can be provided adequate care in the home even with support of Hospice - discussed option of private pay care to supplement the care Hospice would provide- however, [name] is adamant that he can't go home -Symptom management-   -Pain- oxycodone concentrated liquid 5mg q4hr prn  pain or SOB  -Anxiety- patient notes that he doesn't feel anxious- however given that lorazepam relieved some of his symptoms in the past and provided comfort we discussed that often anxiety can have physical manifestations- will order prn lorazepam 1mg po q4hrs prn   Code Status/Advance Care Planning:  DNR  Prognosis:    < 6 months  Discharge Planning: To Be Determined  Primary Diagnoses: Present on Admission: . Hypertension with heart disease . Permanent atrial fibrillation (HCC) . Heart block AV complete (HCC) . HFrEF (heart failure with reduced ejection fraction) (HCC) . CKD (chronic kidney disease) stage 4, GFR 15-29 ml/min (HCC) . Pneumonia   I have reviewed the medical record, interviewed the patient and family, and examined the patient. The following aspects are pertinent.  Past Medical History:  Diagnosis Date  . Allergy   . Arthritis   . Asthma   . Cancer (HCC)   . Cardiac arrhythmia due to congenital heart disease   . Coronary artery disease   . Heart disease   . Heart disease   . Heart murmur   . Hx: UTI (urinary tract infection)    Social History   Socioeconomic History  . Marital status: Married    Spouse name: Not on file  . Number of children: 2  . Years of education: Not on file  . Highest education level: Not on file  Occupational History  . Not on file  Tobacco Use  . Smoking status: Former Smoker  . Smokeless tobacco: Never Used  Substance and Sexual Activity  . Alcohol use: Yes  . Drug use: Never  . Sexual activity: Not Currently  Other Topics Concern  . Not on file  Social History Narrative  . Not on file   Social Determinants of Health   Financial Resource Strain:   . Difficulty of Paying Living Expenses: Not on file  Food Insecurity:   . Worried About Running Out of Food in the Last Year: Not on file  . Ran Out of Food in the Last Year: Not on file  Transportation Needs:   . Lack of Transportation (Medical): Not on file  .  Lack of Transportation (Non-Medical): Not on file  Physical Activity:   . Days of Exercise per Week: Not on file  . Minutes of Exercise per Session: Not on file  Stress:   . Feeling of Stress : Not on file  Social Connections:   . Frequency of Communication with Friends and Family: Not on file  . Frequency of Social Gatherings with Friends and Family: Not on file  . Attends Religious Services: Not on file  . Active Member of Clubs or Organizations: Not on file  . Attends   Club or Organization Meetings: Not on file  . Marital Status: Not on file   Family History  Problem Relation Age of Onset  . Arthritis Mother    Scheduled Meds: . allopurinol  100 mg Oral BID  . apixaban  5 mg Oral BID  . azithromycin  250 mg Oral Q24H  . colchicine  0.6 mg Oral Daily  . finasteride  5 mg Oral q morning - 10a  . furosemide  80 mg Intravenous BID  . midodrine  10 mg Oral TID WC   Continuous Infusions: . cefTRIAXone (ROCEPHIN)  IV 1 g (11/20/19 1656)   PRN Meds:.acetaminophen, ipratropium-albuterol, LORazepam, ondansetron **OR** ondansetron (ZOFRAN) IV, oxyCODONE, polyethylene glycol, senna-docusate, traMADol Medications Prior to Admission:  Prior to Admission medications   Medication Sig Start Date End Date Taking? Authorizing Provider  acetaminophen (TYLENOL) 650 MG CR tablet Take 1,300 mg by mouth daily.    Yes [provider]  albuterol (VENTOLIN HFA) 108 (90 Base) MCG/ACT inhaler Inhale 2 puffs into the lungs every 6 (six) hours as needed for wheezing or shortness of breath. 09/26/19  Yes Jordan, [name], MD  allopurinol (ZYLOPRIM) 100 MG tablet Take 100 mg by mouth 2 (two) times daily.   Yes [provider]  apixaban (ELIQUIS) 5 MG TABS tablet Take 1 tablet (5 mg total) by mouth 2 (two) times daily. 10/15/19  Yes McLean, Dalton S, MD  carvedilol (COREG) 3.125 MG tablet Take 1 tablet (3.125 mg total) by mouth 2 (two) times daily. 10/20/19  Yes Christopher, Bridgette, MD    finasteride (PROSCAR) 5 MG tablet Take 5 mg by mouth every morning.    Yes [provider]  OVER THE COUNTER MEDICATION Apply 1 application topically 3 (three) times daily as needed (pain). CBD Cream   Yes [provider]  potassium chloride 20 MEQ/15ML (10%) SOLN Take 60 mLs (80 mEq total) by mouth daily. 11/06/19  Yes McLean, Dalton S, MD  spironolactone (ALDACTONE) 25 MG tablet Take 0.5 tablets (12.5 mg total) by mouth daily. 10/23/19 01/21/20 Yes McLean, Dalton S, MD  torsemide (DEMADEX) 20 MG tablet Take 4 tablets (80 mg total) by mouth 2 (two) times daily. 10/15/19  Yes McLean, Dalton S, MD   No Known Allergies Review of Systems  Physical Exam Vitals and nursing note reviewed.  Constitutional:      Appearance: He is ill-appearing.  Neurological:     Mental Status: He is oriented to person, place, and time.  Psychiatric:        Judgment: Judgment normal.     Vital Signs: BP 103/70 (BP Location: Left Arm)   Pulse 79   Temp 98.7 F (37.1 C)   Resp (!) 23   Ht 5' 9" (1.753 m)   Wt 78.3 kg   SpO2 100%   BMI 25.49 kg/m  Pain Scale: 0-10 POSS *See Group Information*: 1-Acceptable,Awake and alert Pain Score: 10-Worst pain ever   SpO2: SpO2: 100 % O2 Device:SpO2: 100 % O2 Flow Rate: .O2 Flow Rate (L/min): 7 L/min  IO: Intake/output summary:   Intake/Output Summary (Last 24 hours) at 11/21/2019 1356 Last data filed at 11/21/2019 1138 Gross per 24 hour  Intake --  Output 825 ml  Net -825 ml    LBM: Last BM Date: 11/16/19 Baseline Weight: Weight: 78.3 kg Most recent weight: Weight: 78.3 kg     Palliative Assessment/Data: PPS: 20%     Thank you for this consult. Palliative medicine will continue to follow and assist   as needed.   Time In: 1300 Time Out: 1430 Time Total: 90 minutes Greater than 50%  of this time was spent counseling and coordinating care related to the above assessment and plan.  Signed by:  , AGNP-C Palliative  Medicine    Please contact Palliative Medicine Team phone at [phone] for questions and concerns.  For individual provider: See Amion               

## 2019-11-21 NOTE — Progress Notes (Addendum)
Advanced Heart Failure Rounding Note  PCP-Cardiologist: Buford Dresser, MD   Subjective:     + Orthostatic VS 2/4  Lying      Sitting        Standing  101/67     103/68         72/62  On midodrine 5 tid   AKI improving, SCr 1.90>>1.74>>1.65>>1.49>>1.47. Total CK trending down.   Diuretics restarted yesterday for marked volume overload but strict I/Os not recorded.  Issues w/ incontinence. Currently wearing depends. Remains volume overloaded but no resting dyspnea.   Getting abx for PNA.   Main complaint is left knee pain. Exquisitely tender to touch and increased warmth compared to contralateral side. Has h/o gout.    Objective:   Weight Range: 78.3 kg Body mass index is 25.49 kg/m.   Vital Signs:   Temp:  [97.5 F (36.4 C)-98.7 F (37.1 C)] 98.7 F (37.1 C) (02/05 0817) Pulse Rate:  [68-79] 79 (02/05 0817) Resp:  [16-22] 16 (02/05 0817) BP: (72-111)/(62-70) 103/70 (02/05 0817) SpO2:  [97 %-100 %] 100 % (02/05 0817) Weight:  [78.3 kg] 78.3 kg (02/04 1300) Last BM Date: 11/16/19  Weight change: Filed Weights   11/20/19 1300  Weight: 78.3 kg    Intake/Output:   Intake/Output Summary (Last 24 hours) at 11/21/2019 0908 Last data filed at 11/21/2019 0040 Gross per 24 hour  Intake -  Output 700 ml  Net -700 ml      Physical Exam    General:  Elderly, fatigue appearing WM No resp difficulty HEENT: Normal Neck: Supple. Elevated JVD to ear . Carotids 2+ bilat; no bruits. No lymphadenopathy or thyromegaly appreciated. Cor: PMI nondisplaced. Irregular rate & rhythm. No rubs, gallops or murmurs. Lungs: Clear Abdomen: Soft, nontender, nondistended. No hepatosplenomegaly. No bruits or masses. Good bowel sounds. Extremities: No cyanosis, clubbing, rash, RUE edematous (no erythema), 1-2+ bilateral LEE, bilateral unna boots, Left knee extremely tender to touch and increased warmth compared to contralateral side.  Neuro: Alert & orientedx3, cranial nerves  grossly intact. moves all 4 extremities w/o difficulty. Affect pleasant   Telemetry   A sensed V paced 70s   EKG    N/a   Labs    CBC Recent Labs    11/21/19 0231  HGB 12.2*  HCT 03.7   Basic Metabolic Panel Recent Labs    11/20/19 0259 11/20/19 0259 11/20/19 1447 11/21/19 0231  NA 135   < > 137 136  K 5.7*   < > 4.0 3.9  CL 100   < > 99 98  CO2 22   < > 26 25  GLUCOSE 100*   < > 126* 113*  BUN 53*   < > 48* 46*  CREATININE 1.65*   < > 1.49* 1.47*  CALCIUM 8.8*   < > 8.8* 8.8*  MG 2.5*  --   --  2.2   < > = values in this interval not displayed.   Liver Function Tests Recent Labs    11/20/19 0259 11/21/19 0231  AST 118* 75*  ALT 50* 47*  ALKPHOS 411* 484*  BILITOT 1.6* 1.0  PROT 4.9* 5.1*  ALBUMIN 2.4* 2.4*   No results for input(s): LIPASE, AMYLASE in the last 72 hours. Cardiac Enzymes Recent Labs    11/19/19 1245 11/20/19 0259 11/21/19 0231  CKTOTAL 1,236* 612* 315    BNP: BNP (last 3 results) Recent Labs    11/13/19 1445  BNP 943.0*    ProBNP (  last 3 results) Recent Labs    05/27/19 1318 06/17/19 0938  PROBNP 755.0* 661.0*     D-Dimer No results for input(s): DDIMER in the last 72 hours. Hemoglobin A1C No results for input(s): HGBA1C in the last 72 hours. Fasting Lipid Panel No results for input(s): CHOL, HDL, LDLCALC, TRIG, CHOLHDL, LDLDIRECT in the last 72 hours. Thyroid Function Tests No results for input(s): TSH, T4TOTAL, T3FREE, THYROIDAB in the last 72 hours.  Invalid input(s): FREET3  Other results:   Imaging    No results found.   Medications:     Scheduled Medications: . allopurinol  100 mg Oral BID  . apixaban  5 mg Oral BID  . azithromycin  250 mg Oral Q24H  . finasteride  5 mg Oral q morning - 10a  . furosemide  80 mg Intravenous BID  . midodrine  5 mg Oral TID WC    Infusions: . cefTRIAXone (ROCEPHIN)  IV 1 g (11/20/19 1656)    PRN Medications: acetaminophen, ipratropium-albuterol,  ondansetron **OR** ondansetron (ZOFRAN) IV, polyethylene glycol, senna-docusate, traMADol     Assessment/Plan   1. Unwitnessed fall with suspected syncope: No arrhythmias.  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  + Orthostatic VS, drop in SBP from 103>>72, sitting to standing. Continue midodrine for BP support, increase dose to 10 tid.  - Continue Unna boots for legs.  2. PNA: Noted on CT.  PCT mildly elevated at 0.87.  WBCs not elevated on admit. AF. - Continue Ceftriaxone/azithromycin.  3. Chronic atrial fibrillation: Rate is reasonably controlled. He has underlying CHB with [employer] CRT-P.  - Continue Eliquis.  - Off Coreg with low BP.  4. Acute on chronic systolic CHF: Echo 52/77 with EF 20-25%, severe LVH, severe RV dysfunction.  Biventricular failure.  He is volume overloaded, difficult to manage with RV failure.  He has a Physiological scientist.  - Cardio-active meds on hold with low BP.  - Diuretics held on admit with initial low BP, mild rhabdomyolysis, and AKI (improving).  - Lasix resumed yesterday for volume overload. Remains edematous but looks improved. Strict I/Os not recorded.  Issues w/ incontinence. Currently wearing depends. Nursing staff w/ difficulty getting daily wts given limited ability to stand - Continue IV Lasix 80 mg bid today - Continue bilateral Unna boots.  - Follow BMP 5. AKI on CKD stage IV: Improving, down from 2.0>>1.47 today. Baseline ~ 1.8.  - monitor w/ diuretics 6. 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  Negative urine immunofixation and myeloma panel. 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  - Will plan to start tafamidis when approved, but may be too  late to have much effect at this point.  - Midodrine to help with orthostasis. Will increase dose to 10 mg tid.  7. Hyperkalemia: resolved. K 3.9 today   8. Left Knee Pain: Exquisitely tender w/ increased warmth. H/o gout. Getting loop diuretics. - will check uric acid - given recent fall, consider X-ray. Will defer imaging to primary team  9. Debility: continue to work w/ PT. Suspect he will need HH vs SNF 10.Code Status: DNR/DNI  Length of Stay: 3  Brittainy Simmons, PA-C  11/21/2019, 9:08 AM  Advanced Heart Failure Team Pager [phone] (M-F; 7a - 4p)  Please contact [facility] for night-coverage after hours (4p -7a ) and weekends on [email or web address]  Patient seen with PA, agree with the above note.   Markedly orthostatic today though not symptomatic.  Hard to tell how much he is diuresing as the nursing staff has been unable to get a weight or I/Os.    Main complaint is left knee pain, suspicious for gout.   General: NAD Neck: JVP 14 cm, no thyromegaly or thyroid nodule.  Lungs: Clear to auscultation bilaterally with normal respiratory effort. CV: Nondisplaced PMI.  Heart regular S1/S2, no S3/S4, no murmur.  2+ edema to knees.   Abdomen: Soft, nontender, no hepatosplenomegaly, no distention.  Skin: Intact without lesions or rashes.  Neurologic: Alert and oriented x 3.  Psych: Normal affect. Extremities: No clubbing or cyanosis. Right knee warm/swollen.  HEENT: Normal.   Increase midodrine to 10 mg tid.  Needs to continue IV Lasix 80 mg bid.  Asked the nurse to try to get accurate I/Os, at least use condom catheter.   Start colchicine for possible gout pain and check uric acid.   Will need PT, very immobile.   May be helpful to move to a cardiac floor if a bed comes available.   Loralie Champagne 11/21/2019 12:54 PM

## 2019-11-21 NOTE — Progress Notes (Signed)
PROGRESS NOTE  Charles Mcmahon GGY:[id number] DOB: [birth year]   PCP: [name], [name], MD  Patient is from: home.  Uses cane at baseline.  DOA: 12/14/2019 LOS: 3  Brief Narrative / Interim history: 74 year old with history of complete heart block status post pacemaker, persistent A. fib, systolic CHF EF 03%, CKD stage IV presented with fall found to be in rhabdo.  CT showed dense consolidation in the right middle and bilateral lower lobes.  Diagnosed with community-acquired pneumonia started on Rocephin and azithromycin as well.  Advanced heart failure team managing CHF.  Subjective: No major events overnight or this morning.  Feels "the same".  Still with significant swelling in his legs.  Also reports pain mainly in the left leg.  He says he has trouble lifting his leg due to pain.  Denies chest pain or dyspnea.  Denies GI symptoms.  Objective: Vitals:   11/20/19 1319 11/20/19 1552 11/20/19 2300 11/21/19 0817  BP: (!) 72/62 106/65 111/66 103/70  Pulse:  70 68 79  Resp: 17 16 18 16   Temp:  (!) 97.5 F (36.4 C) 98 F (36.7 C) 98.7 F (37.1 C)  TempSrc:  Oral Oral   SpO2:  97% 98% 100%  Weight:      Height:        Intake/Output Summary (Last 24 hours) at 11/21/2019 1138 Last data filed at 11/21/2019 1138 Gross per 24 hour  Intake --  Output 825 ml  Net -825 ml   Filed Weights   11/20/19 1300  Weight: 78.3 kg    Examination:  GENERAL: No acute distress.  Appears well.  HEENT: MMM.  Vision and hearing grossly intact.  NECK: Supple.  Notable JVD. RESP: On RA.  No IWOB. Good air movement bilaterally. CVS:  RRR. Heart sounds normal.  ABD/GI/GU: Bowel sounds present. Soft. Non tender.  MSK/EXT:  Moves extremities. LE edema bilaterally.  Unna boot over both legs. SKIN: Left upper extremity wound and bruise over bilateral knees. NEURO: Awake, alert and oriented appropriately.  No apparent focal neuro deficit. PSYCH: Calm. Normal affect.   Procedures:  None  Assessment &  Plan: Fall of unknown etiology at home: No arrhythmia on pacemaker interrogation.  Concern about orthostatic hypotension.  CT head without acute finding.  No focal neuro finding on exam.  Patient uses cane at baseline.  Denies history of recurrent fall. -PT/OT-recommended intermittent supervision. -Fall precautions  Rhabdomyolysis: Due to the above.  CK 2260> 2485> 612> 314  Multifocal community-acquired pneumonia: Procalcitonin elevated.  COVID-19 negative.  CXR concerning for RML and bilateral LLL pneumonia. -Ceftriaxone and azithromycin 2/1>> 2/6 -Continue breathing treatments  Acute metabolic encephalopathy: seems to have resolved.  CT negative, B12 and TSH not revealing. -Treat treatable causes.  Chronic systolic: Echo in 50/0938 with EF of 20 to 25%, severe LVH, severely decreased RVSP.  Started on IV Lasix by cardiology.  I&O incomplete but 700 cc charted.  Renal function stable.  Still with significant bilateral lower extremity edema. -HF team managing-IV Lasix 80 mg twice daily per AHF. -Continue monitoring fluid status and renal function. -Sodium and fluid restriction.  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 -Off BB due to hypotension. -Continue Eliquis for anticoagulation.  CKD-4: Baseline Cr1.7> 2.06 (admit)> 1.74>> 1.47: Likely combination of prerenal from hypotension and ATN from rhabdo -Continue monitoring while on diuretics. -On midodrine for hypotension.  Anemia of chronic disease: Hgb relatively stable. -Monitor intermittently.  Elevated LFTs/ALP/total bili: Likely due to rhabdomyolysis.  No GI symptoms. -Continue monitoring.  Left upper extremity wound: POA -Appreciate help by wound care nurse   Goal of care: Patient is DNR/DNI appropriately.  As significant comorbidities as above.   Cardiology recommending palliative care consult -Plan for palliative care meeting today.               DVT prophylaxis: On Eliquis for A. fib. Code Status: DNR/DNI Family Communication: Patient and/or RN. Available if any question. Barriers to discharge: Fluid overload/CHF, and clearance by cardiology. Patient is from home. Anticipated disposition: Likely home when medically ready. Consultants: Advanced heart failure team, palliative care   Microbiology summarized: Influenza PCR negative. COVID-19 PCR negative.  Sch Meds:  Scheduled Meds: . allopurinol  100 mg Oral BID  . apixaban  5 mg Oral BID  . azithromycin  250 mg Oral Q24H  . finasteride  5 mg Oral q morning - 10a  . furosemide  80 mg Intravenous BID  . midodrine  10 mg Oral TID WC   Continuous Infusions: . cefTRIAXone (ROCEPHIN)  IV 1 g (11/20/19 1656)   PRN Meds:.acetaminophen, ipratropium-albuterol, ondansetron **OR** ondansetron (ZOFRAN) IV, polyethylene glycol, senna-docusate, traMADol  Antimicrobials: Anti-infectives (From admission, onward)   Start     Dose/Rate Route Frequency Ordered Stop   11/20/19 1800  azithromycin (ZITHROMAX) tablet 250 mg     250 mg Oral Every 24 hours 11/19/19 1855 11/22/19 1759   11/18/19 1700  azithromycin (ZITHROMAX) 500 mg in sodium chloride 0.9 % 250 mL IVPB  Status:  Discontinued     500 mg 250 mL/hr over 60 Minutes Intravenous Every 24 hours 11/30/2019 1645 11/19/19 1855   11/18/19 1600  cefTRIAXone (ROCEPHIN) 1 g in sodium chloride 0.9 % 100 mL IVPB     1 g 200 mL/hr over 30 Minutes Intravenous Every 24 hours 12/14/2019 1645     12/09/2019 1545  cefTRIAXone (ROCEPHIN) 1 g in sodium chloride 0.9 % 100 mL IVPB     1 g 200 mL/hr over 30 Minutes Intravenous  Once 11/27/2019 1542 12/11/2019 1713   11/30/2019 1545  azithromycin (ZITHROMAX) 500 mg in sodium chloride 0.9 % 250 mL IVPB     500 mg 250 mL/hr over 60 Minutes Intravenous  Once 11/24/2019 1542 11/24/2019 2051       I have  personally reviewed the following labs and images: CBC: Recent Labs  Lab 11/22/2019 1029 11/18/19 0250 11/21/19 0231  WBC 10.0 10.0  --   NEUTROABS 8.9*  --   --   HGB 13.6 12.1* 12.2*  HCT 43.3 37.9* 39.3  MCV 98.4 97.2  --   PLT 281 249  --    BMP &GFR Recent Labs  Lab 11/18/19 0809 11/19/19 0237 11/20/19 0259 11/20/19 1447 11/21/19 0231  NA 137 140 135 137 136  K 3.2* 4.7 5.7* 4.0 3.9  CL 98 101 100 99 98  CO2 29 27 22 26 25   GLUCOSE 149* 102* 100* 126* 113*  BUN 59* 57* 53* 48* 46*  CREATININE 1.90* 1.74* 1.65* 1.49* 1.47*  CALCIUM 8.9 8.8* 8.8* 8.8* 8.8*  MG  --  2.2 2.5*  --  2.2   Estimated Creatinine Clearance: 44.1 mL/min (A) (by C-G formula based on SCr of 1.47 mg/dL (H)). Liver & Pancreas: Recent Labs  Lab 11/19/19 0237 11/20/19 0259 11/21/19 0231  AST 120* 118* 75*  ALT 46* 50* 47*  ALKPHOS  320* 411* 484*  BILITOT 1.2 1.6* 1.0  PROT 4.8* 4.9* 5.1*  ALBUMIN 2.2* 2.4* 2.4*   No results for input(s): LIPASE, AMYLASE in the last 168 hours. No results for input(s): AMMONIA in the last 168 hours. Diabetic: No results for input(s): HGBA1C in the last 72 hours. No results for input(s): GLUCAP in the last 168 hours. Cardiac Enzymes: Recent Labs  Lab 11/24/2019 1029 11/18/19 0250 11/19/19 1245 11/20/19 0259 11/21/19 0231  CKTOTAL 2,260* 2,485* 1,236* 612* 315   Recent Labs    05/27/19 1318 06/17/19 0938  PROBNP 755.0* 661.0*   Coagulation Profile: No results for input(s): INR, PROTIME in the last 168 hours. Thyroid Function Tests: No results for input(s): TSH, T4TOTAL, FREET4, T3FREE, THYROIDAB in the last 72 hours. Lipid Profile: No results for input(s): CHOL, HDL, LDLCALC, TRIG, CHOLHDL, LDLDIRECT in the last 72 hours. Anemia Panel: No results for input(s): VITAMINB12, FOLATE, FERRITIN, TIBC, IRON, RETICCTPCT in the last 72 hours. Urine analysis:    Component Value Date/Time   COLORURINE YELLOW 09/08/2019 [place]  09/08/2019 0947   LABSPEC 1.021 09/08/2019 0947   PHURINE 5.0 09/08/2019 0947   GLUCOSEU NEGATIVE 09/08/2019 0947   GLUCOSEU NEGATIVE 05/27/2019 1318   HGBUR NEGATIVE 09/08/2019 0947   BILIRUBINUR NEGATIVE 09/08/2019 0947   KETONESUR NEGATIVE 09/08/2019 0947   PROTEINUR 30 (A) 09/08/2019 0947   UROBILINOGEN 0.2 05/27/2019 1318   NITRITE NEGATIVE 09/08/2019 0947   LEUKOCYTESUR NEGATIVE 09/08/2019 0947   Sepsis Labs: Invalid input(s): PROCALCITONIN, [place]  Microbiology: Recent Results (from the past 240 hour(s))  Respiratory Panel by RT PCR (Flu A&B, Covid) - Nasopharyngeal Swab     Status: None   Collection Time: 12/04/2019  3:51 PM   Specimen: Nasopharyngeal Swab  Result Value Ref Range Status   SARS Coronavirus 2 by RT PCR NEGATIVE NEGATIVE Final    Comment: (NOTE) SARS-CoV-2 target nucleic acids are NOT DETECTED. The SARS-CoV-2 RNA is generally detectable in upper respiratoy specimens during the acute phase of infection. The lowest concentration of SARS-CoV-2 viral copies this assay can detect is 131 copies/mL. A negative result does not preclude SARS-Cov-2 infection and should not be used as the sole basis for treatment or other patient management decisions. A negative result may occur with  improper specimen collection/handling, submission of specimen other than nasopharyngeal swab, presence of viral mutation(s) within the areas targeted by this assay, and inadequate number of viral copies (<131 copies/mL). A negative result must be combined with clinical observations, patient history, and epidemiological information. The expected result is Negative. Fact Sheet for Patients:  PinkCheek.be Fact Sheet for Healthcare Providers:  GravelBags.it This test is not yet ap proved or cleared by the [place] FDA and  has been authorized for detection and/or diagnosis of SARS-CoV-2 by FDA under an Emergency Use  Authorization (EUA). This EUA will remain  in effect (meaning this test can be used) for the duration of the COVID-19 declaration under Section 564(b)(1) of the Act, 21 U.S.C. section 360bbb-3(b)(1), unless the authorization is terminated or revoked sooner.    Influenza A by PCR NEGATIVE NEGATIVE Final   Influenza B by PCR NEGATIVE NEGATIVE Final    Comment: (NOTE) The Xpert Xpress SARS-CoV-2/FLU/RSV assay is intended as an aid in  the diagnosis of influenza from Nasopharyngeal swab specimens and  should not be used as a sole basis for treatment. Nasal washings and  aspirates are unacceptable for Xpert Xpress SARS-CoV-2/FLU/RSV  testing. Fact Sheet for Patients: PinkCheek.be Fact  Sheet for Healthcare Providers: GravelBags.it This test is not yet approved or cleared by the [place] and  has been authorized for detection and/or diagnosis of SARS-CoV-2 by  FDA under an Emergency Use Authorization (EUA). This EUA will remain  in effect (meaning this test can be used) for the duration of the  Covid-19 declaration under Section 564(b)(1) of the Act, 21  U.S.C. section 360bbb-3(b)(1), unless the authorization is  terminated or revoked. Performed at [facility], [place] [address] [place], [place] [address]     Radiology Studies: No results found.    Yu Cragun T. [place]  If 7PM-7AM, please contact night-coverage [email or web address] Password [email or web address] 11/21/2019, 11:38 AM

## 2019-11-21 NOTE — Care Management (Addendum)
CM consulted by Palliative to assist with ALF and hospice.  CM explained to both Palliative and daughter that [facility] does not facilitate ALF transition - and the process as a whole can take weeks.  Daughter informed CM that she has already researched ALF facilities.  CM explained that pt will need to be able to either pay out of pocket or have medicaid to pay for ALF (pt has [facility] only).  Pt will need an interim plan while awaiting ALF acceptance driven from [place] discussions with Palliative Department.  CM requested Palliative to reach back out to daughter to discuss viable options regarding discharge from [facility].    Update 1700:  Daughter informed CM that she can private pay for an ALF - she already has information on the ones she is interested in and just needs to go through the information to chose.  Daughter is adamant that pt can not discharge home even with 24/7 caregivers private pay.   Per daughter pt does not want rehab.  CM will request weekend TOC to follow up with daughter.    Recommendation from Palliative continues to be outpt hospice followup - this can be arranged once discharge disposition is determined.

## 2019-11-21 NOTE — Progress Notes (Signed)
Palliative- brief note- full consult to follow-   Met with patient and his daughter, Charles Mcmahon. Patient would like to treat treatable while in hospital- discharge when stabilized- and discharge into the care of Hospice with focus on comfort and quality as he progresses through end of life. They are interested in looking into possible assisted living facility placement- request social work assistance.   Mariana Kaufman, AGNP-C Palliative Medicine  Please call Palliative Medicine team phone with any questions [phone]. For individual providers please see AMION.  No charge

## 2019-11-21 NOTE — Progress Notes (Signed)
Physical Therapy Treatment Patient Details Name: Charles Mcmahon MRN: [id number] DOB: 12-27-1945 Today's Date: 11/21/2019    History of Present Illness Pt is a 74 y.o. male admitted 11/20/2019 after fall at home, found down after ~9 hrs on floor, pt does not remember cause of fall; worked up for mild rhabdomyolysis. Head CT negative for acute abnormality; unable to do MRI secondary to pacemaker. CXR with suspected multifocal PNA. PMH includes nonischemic cardiomyopathy with history of complete heart block s/p pacemaker, CKD IV, afib, CAD, R TKR.   PT Comments    Pt not progressing with mobility, limited by significant L knee pain (doesn't seem related to pt's fall onto R-side as this pain is new over the past couple days; per chart, has h/o gout?). Pt now with bilateral unna boots, but pain worse than yesterday's session. Pt requiring modA to stand to RW, unable to tolerate taking more than a few steps and minimal ROM to knee. Based on current functional status, pt may require SNF-level therapies to maximize functional mobility and independence prior to return home if family not able to provide necessary physical assist. Noted plan for meeting this afternoon with palliative services. Will continue to follow and assist with d/c planning.    Follow Up Recommendations  SNF;Supervision for mobility/OOB(vs. HHPT pending progression)     Equipment Recommendations  None recommended by PT    Recommendations for Other Services       Precautions / Restrictions Precautions Precautions: Fall;Other (comment) Precaution Comments: Hypotension (asymptomatic); bilateral unna boots; significant L knee pain Restrictions Weight Bearing Restrictions: No    Mobility  Bed Mobility Overal bed mobility: Needs Assistance Bed Mobility: Sit to Supine       Sit to supine: Mod assist   General bed mobility comments: ModA for LLE management, pt limited by significant L knee pain  Transfers Overall transfer  level: Needs assistance Equipment used: Rolling walker (2 wheeled) Transfers: Sit to/from Stand Sit to Stand: Mod assist         General transfer comment: Increased time and effort repositioning BLEs in preparation to stand, able to offload buttocks well with BUE support on recliner armrests, requiring modA to maintain balance transitioning UE support to RW; limited by decrease WB thru LLE due to pain  Ambulation/Gait Ambulation/Gait assistance: Min guard Gait Distance (Feet): 2 Feet Assistive device: Rolling walker (2 wheeled) Gait Pattern/deviations: Step-to pattern;Decreased weight shift to left;Antalgic;Trunk flexed   Gait velocity interpretation: <1.31 ft/sec, indicative of household ambulator General Gait Details: Very slow, antalgic steps from recliner to bed with RW and min guard; minA to assist LLE with backwards steps   Stairs             Wheelchair Mobility    Modified Rankin (Stroke Patients Only)       Balance Overall balance assessment: Needs assistance Sitting-balance support: No upper extremity supported;Feet supported Sitting balance-Leahy Scale: Fair       Standing balance-Leahy Scale: Poor Standing balance comment: Reliant on UE support to offload painful LE                            Cognition Arousal/Alertness: Awake/alert Behavior During Therapy: WFL for tasks assessed/performed Overall Cognitive Status: Within Functional Limits for tasks assessed  Exercises Other Exercises Other Exercises: Seated PROM L knee extension; supine PROM L knee flex/ext, limited by pt grimacing/moaning in pain    General Comments General comments (skin integrity, edema, etc.): Post-mobility BP 109/78      Pertinent Vitals/Pain Pain Assessment: Faces Faces Pain Scale: Hurts whole lot Pain Location: L knee with WB and PROM Pain Descriptors / Indicators:  Discomfort;Grimacing;Guarding;Moaning Pain Intervention(s): Monitored during session;Limited activity within patient's tolerance;Repositioned    Home Living                      Prior Function            PT Goals (current goals can now be found in the care plan section) Progress towards PT goals: Not progressing toward goals - comment(limited by LLE pain)    Frequency    Min 3X/week      PT Plan Discharge plan needs to be updated    Co-evaluation              AM-PAC PT "6 Clicks" Mobility   Outcome Measure  Help needed turning from your back to your side while in a flat bed without using bedrails?: A Little Help needed moving from lying on your back to sitting on the side of a flat bed without using bedrails?: A Lot Help needed moving to and from a bed to a chair (including a wheelchair)?: A Lot Help needed standing up from a chair using your arms (e.g., wheelchair or bedside chair)?: A Lot Help needed to walk in hospital room?: A Lot Help needed climbing 3-5 steps with a railing? : A Lot 6 Click Score: 13    End of Session   Activity Tolerance: Patient limited by pain Patient left: in bed;with call bell/phone within reach;with bed alarm set Nurse Communication: Mobility status PT Visit Diagnosis: Other abnormalities of gait and mobility (R26.89)     Time: 6270-3500 PT Time Calculation (min) (ACUTE ONLY): 24 min  Charges:  $Therapeutic Exercise: 8-22 mins $Therapeutic Activity: 8-22 mins                    Mabeline Caras, PT, DPT Acute Rehabilitation Services  Pager [phone] Office [place] 11/21/2019, 10:22 AM

## 2019-11-21 NOTE — Telephone Encounter (Signed)
Patient Advocate Encounter   Received notification from [employer] that prior authorization for Vyndamax is required.   PA submitted on CoverMyMeds Key : [id number] Status is pending   Will continue to follow.

## 2019-11-22 DIAGNOSIS — Z515 Encounter for palliative care: Secondary | ICD-10-CM

## 2019-11-22 LAB — HEMOGLOBIN AND HEMATOCRIT, BLOOD
HCT: 40.5 % (ref 39.0–52.0)
Hemoglobin: 12.8 g/dL — ABNORMAL LOW (ref 13.0–17.0)

## 2019-11-22 LAB — BASIC METABOLIC PANEL
Anion gap: 15 (ref 5–15)
BUN: 39 mg/dL — ABNORMAL HIGH (ref 8–23)
CO2: 24 mmol/L (ref 22–32)
Calcium: 8.9 mg/dL (ref 8.9–10.3)
Chloride: 97 mmol/L — ABNORMAL LOW (ref 98–111)
Creatinine, Ser: 1.59 mg/dL — ABNORMAL HIGH (ref 0.61–1.24)
GFR calc Af Amer: 49 mL/min — ABNORMAL LOW (ref >60)
GFR calc non Af Amer: 42 mL/min — ABNORMAL LOW (ref >60)
Glucose, Bld: 111 mg/dL — ABNORMAL HIGH (ref 70–99)
Potassium: 4.5 mmol/L (ref 3.5–5.1)
Sodium: 136 mmol/L (ref 135–145)

## 2019-11-22 LAB — URIC ACID: Uric Acid, Serum: 6.1 mg/dL (ref 3.7–8.6)

## 2019-11-22 LAB — MAGNESIUM: Magnesium: 2.2 mg/dL (ref 1.7–2.4)

## 2019-11-22 MED ORDER — MORPHINE SULFATE (CONCENTRATE) 10 MG/0.5ML PO SOLN
10.0000 mg | Freq: Four times a day (QID) | ORAL | Status: DC
  Administered 2019-11-22 (×2): 10 mg via ORAL
  Filled 2019-11-22 (×4): qty 0.5

## 2019-11-22 MED ORDER — MORPHINE SULFATE (PF) 4 MG/ML IV SOLN: 4.0000 mg | INTRAVENOUS | Status: DC | PRN

## 2019-11-22 MED ORDER — MORPHINE SULFATE (PF) 2 MG/ML IV SOLN
2.0000 mg | Freq: Once | INTRAVENOUS | Status: AC
  Administered 2019-11-22: 2 mg via INTRAVENOUS
  Filled 2019-11-22: qty 1

## 2019-11-22 MED ORDER — FENTANYL 25 MCG/HR TD PT72
1.0000 | MEDICATED_PATCH | TRANSDERMAL | Status: DC
  Administered 2019-11-22: 1 via TRANSDERMAL
  Filled 2019-11-22: qty 1

## 2019-11-22 MED ORDER — MIDODRINE HCL 5 MG PO TABS: 15.0000 mg | ORAL_TABLET | Freq: Three times a day (TID) | ORAL | Status: DC

## 2019-11-22 MED ORDER — LORAZEPAM 1 MG PO TABS
2.0000 mg | ORAL_TABLET | Freq: Every day | ORAL | Status: DC
  Administered 2019-11-23: 2 mg via ORAL
  Filled 2019-11-22: qty 2

## 2019-11-22 MED ORDER — ACETAMINOPHEN 325 MG PO TABS
650.0000 mg | ORAL_TABLET | Freq: Four times a day (QID) | ORAL | Status: DC
  Administered 2019-11-22 (×2): 650 mg via ORAL
  Filled 2019-11-22 (×4): qty 2

## 2019-11-22 NOTE — Plan of Care (Signed)
  Problem: Coping: Goal: Level of anxiety will decrease Outcome: Progressing   Problem: Safety: Goal: Ability to remain free from injury will improve Outcome: Progressing   Problem: Skin Integrity: Goal: Risk for impaired skin integrity will decrease Outcome: Progressing   Problem: Activity: Goal: Risk for activity intolerance will decrease Outcome: Not Progressing   Problem: Pain Managment: Goal: General experience of comfort will improve Outcome: Not Progressing

## 2019-11-22 NOTE — Progress Notes (Addendum)
Advanced Heart Failure Rounding Note  PCP-Cardiologist: Buford Dresser, MD   Subjective:    Lying in bed. Hurts all over but somewhat improved with pain meds. SBP 90s. Denies dyspnea  Objective:   Weight Range: 81 kg Body mass index is 26.37 kg/m.   Vital Signs:   Temp:  [97.7 F (36.5 C)-98.1 F (36.7 C)] 97.7 F (36.5 C) (02/06 0812) Pulse Rate:  [69-80] 69 (02/06 0812) Resp:  [16-19] 19 (02/06 0812) BP: (94-106)/(61-76) 99/72 (02/06 0812) SpO2:  [95 %-99 %] 99 % (02/06 0812) Weight:  [81 kg] 81 kg (02/06 0500) Last BM Date: 11/16/19  Weight change: Filed Weights   11/20/19 1300 11/22/19 0500  Weight: 78.3 kg 81 kg    Intake/Output:   Intake/Output Summary (Last 24 hours) at 11/22/2019 1428 Last data filed at 11/21/2019 2025 Gross per 24 hour  Intake --  Output 500 ml  Net -500 ml      Physical Exam    General:  Weak appearing uncomfortable HEENT: normal Neck: supple. JVP to jaw Carotids 2+ bilat; no bruits. No lymphadenopathy or thryomegaly appreciated. Cor: PMI nondisplaced. Regular rate & rhythm. No rubs, gallops or murmurs. Lungs: clear Abdomen: soft, nontender, nondistended. No hepatosplenomegaly. No bruits or masses. Good bowel sounds. Extremities: no cyanosis, clubbing, rash, 2+ edema + unna  Tender to touch all over  Neuro: alert & orientedx3, cranial nerves grossly intact. moves all 4 extremities w/o difficulty. Affect pleasant    Telemetry   A sensed V paced 70-80s Personally reviewed   Labs    CBC Recent Labs    11/21/19 0231 11/22/19 0314  HGB 12.2* 12.8*  HCT 39.3 25.3   Basic Metabolic Panel Recent Labs    11/21/19 0231 11/22/19 0314  NA 136 136  K 3.9 4.5  CL 98 97*  CO2 25 24  GLUCOSE 113* 111*  BUN 46* 39*  CREATININE 1.47* 1.59*  CALCIUM 8.8* 8.9  MG 2.2 2.2   Liver Function Tests Recent Labs    11/20/19 0259 11/21/19 0231  AST 118* 75*  ALT 50* 47*  ALKPHOS 411* 484*  BILITOT 1.6* 1.0  PROT  4.9* 5.1*  ALBUMIN 2.4* 2.4*   No results for input(s): LIPASE, AMYLASE in the last 72 hours. Cardiac Enzymes Recent Labs    11/20/19 0259 11/21/19 0231  CKTOTAL 612* 315    BNP: BNP (last 3 results) Recent Labs    11/13/19 1445  BNP 943.0*    ProBNP (last 3 results) Recent Labs    05/27/19 1318 06/17/19 0938  PROBNP 755.0* 661.0*     D-Dimer No results for input(s): DDIMER in the last 72 hours. Hemoglobin A1C No results for input(s): HGBA1C in the last 72 hours. Fasting Lipid Panel No results for input(s): CHOL, HDL, LDLCALC, TRIG, CHOLHDL, LDLDIRECT in the last 72 hours. Thyroid Function Tests No results for input(s): TSH, T4TOTAL, T3FREE, THYROIDAB in the last 72 hours.  Invalid input(s): FREET3  Other results:   Imaging    No results found.   Medications:     Scheduled Medications: . allopurinol  100 mg Oral BID  . apixaban  5 mg Oral BID  . colchicine  0.6 mg Oral Daily  . finasteride  5 mg Oral q morning - 10a  . furosemide  80 mg Intravenous BID  . midodrine  10 mg Oral TID WC    Infusions: . cefTRIAXone (ROCEPHIN)  IV 1 g (11/21/19 1605)    PRN Medications: acetaminophen, ipratropium-albuterol,  LORazepam, ondansetron **OR** ondansetron (ZOFRAN) IV, oxyCODONE, polyethylene glycol, senna-docusate, traMADol     Assessment/Plan    Assessment/Plan   1. Unwitnessed fall with suspected syncope: No arrhythmias.  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  - BPs remain soft. + orthostatic - Continue midodrine for BP support, increase dose to 15 tid.   - Continue Unna boots for legs.  2. PNA: Noted on CT.  PCT mildly elevated at 0.87.  WBCs not elevated on admit. AF. - Continue Ceftriaxone/azithromycin.  3. Chronic atrial fibrillation: Rate is reasonably controlled. He has  underlying CHB with [employer] CRT-P.  - Continue Eliquis.  - Off Coreg with low BP.  4. Acute on chronic systolic CHF: Echo 69/45 with EF 20-25%, severe LVH, severe RV dysfunction.  Biventricular failure.  He is volume overloaded, difficult to manage with RV failure.  He has a Physiological scientist.  - Cardio-active meds on hold with low BP.  - Diuretics held on admit with initial low BP, mild rhabdomyolysis, and AKI (improving).  - Remains on lasix  Remains edematous but looks improved. Strict I/Os not recorded.  Issues w/ incontinence. Currently wearing depends. Nursing staff w/ difficulty getting daily wts given limited ability to stand - Continue IV Lasix 80 mg bid today as BP tolerates - Continue bilateral Unna boots.  - Follow BMP 5. AKI on CKD stage IV: Improving, down from 2.0>>1.47 >> 1.59 today. Baseline ~ 1.8.  - monitor w/ diuretics 6. 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  Negative urine immunofixation and myeloma panel. 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  - Had planned to start tafamidis when approved, but may be too late to have much effect at this point.  - Midodrine to help with orthostasis. Will increase dose to 10 mg tid.  7. Hyperkalemia: resolved. K 4.5 today   8. Left Knee Pain: Exquisitely tender w/ increased warmth. H/o gout. Getting loop diuretics. - uric acid ok  - given recent fall, consider X-ray. Will defer imaging to primary team  9. Debility: continue to work w/ PT. Suspect he will need HH vs SNF 10.Code Status: DNR/DNI  He is end-stage. Not much left to offer. Palliative care seeing. Will ask for help with pain control. CKs falling some rhabdo seems adequately treated.   Glori Bickers, MD  2:30 PM

## 2019-11-22 NOTE — Progress Notes (Signed)
PROGRESS NOTE  Charles Mcmahon OVF:[id number] DOB: 1946/08/25   PCP: [name], [name], MD  Patient is from: home.  Uses cane at baseline.  DOA: 11/20/2019 LOS: 4  Brief Narrative / Interim history: 74 year old with history of complete heart block status post pacemaker, persistent A. fib, systolic CHF EF 84%, CKD stage IV presented with fall found to be in rhabdo.  CT showed dense consolidation in the right middle and bilateral lower lobes.  Diagnosed with community-acquired pneumonia started on Rocephin and azithromycin as well.  Advanced heart failure team managing CHF.  Subjective: Reportedly had severe left leg pain overnight.  No other major events.  Reports pain in both legs mainly in his left with movement.  He denies pain at rest.  Denies chest pain, dyspnea or GI symptoms.  Objective: Vitals:   11/21/19 1639 11/21/19 2031 11/22/19 0500 11/22/19 0812  BP: 106/76 94/61  99/72  Pulse: 75 80  69  Resp: '16 16  19  '$ Temp: 98.1 F (36.7 C) 97.8 F (36.6 C)  97.7 F (36.5 C)  TempSrc: Oral     SpO2: 95% 96%  99%  Weight:   81 kg   Height:        Intake/Output Summary (Last 24 hours) at 11/22/2019 1108 Last data filed at 11/21/2019 2025 Gross per 24 hour  Intake --  Output 625 ml  Net -625 ml   Filed Weights   11/20/19 1300 11/22/19 0500  Weight: 78.3 kg 81 kg    Examination:  GENERAL: No acute distress.  Appears well.  HEENT: MMM.  Vision and hearing grossly intact.  NECK: Supple.  No apparent JVD.  RESP: On RA.  No IWOB.  Fair aeration bilaterally. CVS:  RRR. Heart sounds normal.  ABD/GI/GU: Bowel sounds present. Soft. Non tender.  MSK/EXT: On abdomen over both legs.  Bilateral edema improved but dependent edema's. SKIN: LUE wound and bruise over bilateral knees. NEURO: Awake, alert and oriented appropriately.  No apparent focal neuro deficit. PSYCH: Calm. Normal affect.    Procedures:  None  Assessment & Plan: Fall of unknown etiology at home: No arrhythmia on  pacemaker interrogation.  Concern about orthostatic hypotension.  CT head without acute finding.  No focal neuro finding on exam.  Patient uses cane at baseline.  Denies history of recurrent fall. -Fall precautions -PT/OT-patient now considering hospice while still in the hospital.  Rhabdomyolysis: Due to the above.  CK 2260> 2485> 612> 314  Multifocal community-acquired pneumonia: Procalcitonin elevated.  COVID-19 negative.  CXR concerning for RML and bilateral LLL pneumonia. -Ceftriaxone and azithromycin 2/1>> 2/6 -Continue breathing treatments  Acute metabolic encephalopathy: seems to have resolved.  CT negative, B12 and TSH not revealing. -Treat treatable causes.  Chronic systolic: Echo in 16/6063 with EF of 20 to 25%, severe LVH, severely decreased RVSP.  Started on IV Lasix by cardiology.  I&O incomplete but 700 cc and 2 unmeasured voids charted.  Renal function relatively stable.  Now has dependent edema's. -HF team managing-IV Lasix 80 mg twice daily per AHF. -Continue monitoring fluid status and renal function. -Sodium and fluid restriction.  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 -Off BB due to hypotension. -Continue Eliquis for anticoagulation.  CKD-4: Baseline Cr1.7> 2.06 (admit)> 1.74>> 1.47> 1.59. -Continue monitoring while on diuretics. -On midodrine for hypotension.  Anemia of chronic disease: Hgb relatively stable. -Monitor  intermittently.  Elevated LFTs/ALP/total bili: Likely due to rhabdomyolysis.  No GI symptoms. -Continue monitoring.  Left upper extremity wound: POA -Appreciate help by wound care nurse   Goal of care: Patient is DNR/DNI appropriately.  As significant comorbidities as above.  Cardiology recommending palliative care consult.  Patient and family met with palliative care.   Plan is home hospice but family interested in ALF.  Daughter investigating ALF options.               DVT prophylaxis: On Eliquis for A. fib. Code Status: DNR/DNI Family Communication: Patient and/or RN. Available if any question. Barriers to discharge: Fluid overload/CHF, and clearance by cardiology. Patient is from home. Anticipated disposition: Likely ALF with hospice. Family searching. Consultants: Advanced heart failure team, palliative care   Microbiology summarized: Influenza PCR negative. COVID-19 PCR negative.  Sch Meds:  Scheduled Meds: . allopurinol  100 mg Oral BID  . apixaban  5 mg Oral BID  . colchicine  0.6 mg Oral Daily  . finasteride  5 mg Oral q morning - 10a  . furosemide  80 mg Intravenous BID  . midodrine  10 mg Oral TID WC   Continuous Infusions: . cefTRIAXone (ROCEPHIN)  IV 1 g (11/21/19 1605)   PRN Meds:.acetaminophen, ipratropium-albuterol, LORazepam, ondansetron **OR** ondansetron (ZOFRAN) IV, oxyCODONE, polyethylene glycol, senna-docusate, traMADol  Antimicrobials: Anti-infectives (From admission, onward)   Start     Dose/Rate Route Frequency Ordered Stop   11/20/19 1800  azithromycin (ZITHROMAX) tablet 250 mg     250 mg Oral Every 24 hours 11/19/19 1855 11/21/19 1713   11/18/19 1700  azithromycin (ZITHROMAX) 500 mg in sodium chloride 0.9 % 250 mL IVPB  Status:  Discontinued     500 mg 250 mL/hr over 60 Minutes Intravenous Every 24 hours 11/23/2019 1645 11/19/19 1855   11/18/19 1600  cefTRIAXone (ROCEPHIN) 1 g in sodium chloride 0.9 % 100 mL IVPB     1 g 200 mL/hr over 30 Minutes Intravenous Every 24 hours 11/19/2019 1645 11/22/19 2359   12/07/2019 1545  cefTRIAXone (ROCEPHIN) 1 g in sodium chloride 0.9 % 100 mL IVPB     1 g 200 mL/hr over 30 Minutes Intravenous  Once 11/19/2019 1542 11/28/2019 1713   11/19/2019 1545  azithromycin (ZITHROMAX) 500 mg in sodium chloride 0.9 % 250 mL IVPB     500 mg 250 mL/hr over 60 Minutes Intravenous  Once 11/22/2019  1542 12/13/2019 2051       I have personally reviewed the following labs and images: CBC: Recent Labs  Lab 11/24/2019 1029 11/18/19 0250 11/21/19 0231 11/22/19 0314  WBC 10.0 10.0  --   --   NEUTROABS 8.9*  --   --   --   HGB 13.6 12.1* 12.2* 12.8*  HCT 43.3 37.9* 39.3 40.5  MCV 98.4 97.2  --   --   PLT 281 249  --   --    BMP &GFR Recent Labs  Lab 11/19/19 0237 11/20/19 0259 11/20/19 1447 11/21/19 0231 11/22/19 0314  NA 140 135 137 136 136  K 4.7 5.7* 4.0 3.9 4.5  CL 101 100 99 98 97*  CO2 '27 22 26 25 24  '$ GLUCOSE 102* 100* 126* 113* 111*  BUN 57* 53* 48* 46* 39*  CREATININE 1.74* 1.65* 1.49* 1.47* 1.59*  CALCIUM 8.8* 8.8* 8.8* 8.8* 8.9  MG 2.2 2.5*  --  2.2 2.2   Estimated Creatinine Clearance: 40.8 mL/min (A) (by C-G formula based on SCr of 1.59 mg/dL (  H)). Liver & Pancreas: Recent Labs  Lab 11/19/19 0237 11/20/19 0259 11/21/19 0231  AST 120* 118* 75*  ALT 46* 50* 47*  ALKPHOS 320* 411* 484*  BILITOT 1.2 1.6* 1.0  PROT 4.8* 4.9* 5.1*  ALBUMIN 2.2* 2.4* 2.4*   No results for input(s): LIPASE, AMYLASE in the last 168 hours. No results for input(s): AMMONIA in the last 168 hours. Diabetic: No results for input(s): HGBA1C in the last 72 hours. No results for input(s): GLUCAP in the last 168 hours. Cardiac Enzymes: Recent Labs  Lab 11/27/2019 1029 11/18/19 0250 11/19/19 1245 11/20/19 0259 11/21/19 0231  CKTOTAL 2,260* 2,485* 1,236* 612* 315   Recent Labs    05/27/19 1318 06/17/19 0938  PROBNP 755.0* 661.0*   Coagulation Profile: No results for input(s): INR, PROTIME in the last 168 hours. Thyroid Function Tests: No results for input(s): TSH, T4TOTAL, FREET4, T3FREE, THYROIDAB in the last 72 hours. Lipid Profile: No results for input(s): CHOL, HDL, LDLCALC, TRIG, CHOLHDL, LDLDIRECT in the last 72 hours. Anemia Panel: No results for input(s): VITAMINB12, FOLATE, FERRITIN, TIBC, IRON, RETICCTPCT in the last 72 hours. Urine analysis:    Component  Value Date/Time   COLORURINE YELLOW 09/08/2019 [place] 09/08/2019 0947   LABSPEC 1.021 09/08/2019 0947   PHURINE 5.0 09/08/2019 0947   GLUCOSEU NEGATIVE 09/08/2019 0947   GLUCOSEU NEGATIVE 05/27/2019 1318   HGBUR NEGATIVE 09/08/2019 0947   BILIRUBINUR NEGATIVE 09/08/2019 0947   KETONESUR NEGATIVE 09/08/2019 0947   PROTEINUR 30 (A) 09/08/2019 0947   UROBILINOGEN 0.2 05/27/2019 1318   NITRITE NEGATIVE 09/08/2019 0947   LEUKOCYTESUR NEGATIVE 09/08/2019 0947   Sepsis Labs: Invalid input(s): PROCALCITONIN, [place]  Microbiology: Recent Results (from the past 240 hour(s))  Respiratory Panel by RT PCR (Flu A&B, Covid) - Nasopharyngeal Swab     Status: None   Collection Time: 12/01/2019  3:51 PM   Specimen: Nasopharyngeal Swab  Result Value Ref Range Status   SARS Coronavirus 2 by RT PCR NEGATIVE NEGATIVE Final    Comment: (NOTE) SARS-CoV-2 target nucleic acids are NOT DETECTED. The SARS-CoV-2 RNA is generally detectable in upper respiratoy specimens during the acute phase of infection. The lowest concentration of SARS-CoV-2 viral copies this assay can detect is 131 copies/mL. A negative result does not preclude SARS-Cov-2 infection and should not be used as the sole basis for treatment or other patient management decisions. A negative result may occur with  improper specimen collection/handling, submission of specimen other than nasopharyngeal swab, presence of viral mutation(s) within the areas targeted by this assay, and inadequate number of viral copies (<131 copies/mL). A negative result must be combined with clinical observations, patient history, and epidemiological information. The expected result is Negative. Fact Sheet for Patients:  PinkCheek.be Fact Sheet for Healthcare Providers:  GravelBags.it This test is not yet ap proved or cleared by the [place] FDA and  has been authorized for  detection and/or diagnosis of SARS-CoV-2 by FDA under an Emergency Use Authorization (EUA). This EUA will remain  in effect (meaning this test can be used) for the duration of the COVID-19 declaration under Section 564(b)(1) of the Act, 21 U.S.C. section 360bbb-3(b)(1), unless the authorization is terminated or revoked sooner.    Influenza A by PCR NEGATIVE NEGATIVE Final   Influenza B by PCR NEGATIVE NEGATIVE Final    Comment: (NOTE) The Xpert Xpress SARS-CoV-2/FLU/RSV assay is intended as an aid in  the diagnosis of influenza from Nasopharyngeal swab specimens and  should not be  used as a sole basis for treatment. Nasal washings and  aspirates are unacceptable for Xpert Xpress SARS-CoV-2/FLU/RSV  testing. Fact Sheet for Patients: PinkCheek.be Fact Sheet for Healthcare Providers: GravelBags.it This test is not yet approved or cleared by the [place] FDA and  has been authorized for detection and/or diagnosis of SARS-CoV-2 by  FDA under an Emergency Use Authorization (EUA). This EUA will remain  in effect (meaning this test can be used) for the duration of the  Covid-19 declaration under Section 564(b)(1) of the Act, 21  U.S.C. section 360bbb-3(b)(1), unless the authorization is  terminated or revoked. Performed at [facility], [place] [address] [place], [place] [address]     Radiology Studies: No results found.    Joreen Swearingin T. [place]  If 7PM-7AM, please contact night-coverage [email or web address] Password [email or web address] 11/22/2019, 11:08 AM

## 2019-11-22 NOTE — TOC Progression Note (Signed)
Transition of Care [facility]) - Progression Note    Patient Details  Name: Charles Mcmahon MRN: [id number] Date of Birth: 1946-07-23  Transition of Care [facility]) CM/SW [place], [place] Phone Number: 11/22/2019, 3:41 PM  Clinical Narrative:   CSW received notification from palliative medicine NP that daughter has now elected to pursue residential hospice, chosen [facility]. CSW contacted hospice liaison to provide referral. No beds available today but they will notify CSW when a bed is available and reach out to patient's daughter. CSW to follow.    Expected Discharge Plan: [place] Barriers to Discharge: Hospice Bed not available  Expected Discharge Plan and Services Expected Discharge Plan: [place]     Post Acute Care Choice: Home Health                             HH Arranged: RN, PT, Nurse's Aide, Social Work [facility] Agency: [facility] Date HH Agency Contacted: 11/20/19 Time [place]: 1150 Representative spoke with at [place]: Cassie   Social Determinants of Health ([place]) Interventions    Readmission Risk Interventions No flowsheet data found.

## 2019-11-22 NOTE — Progress Notes (Signed)
Daily Progress Note   Patient Name: Charles Mcmahon       Date: 11/22/2019 DOB: 06/02/1946  Age: 74 y.o. MRN#: [id number] Attending Physician: Mercy Riding, MD Primary Care Physician: [name], [name], MD Admit Date: 11/21/2019  Reason for Consultation/Follow-up: Establishing goals of care, Pain control and Psychosocial/spiritual support  Subjective: Patient with severe diffuse pain overnight and today. He is not eating. He appears yellow- noted transaminases and bilirubin elevated on admission. Creatinine is trending up. He is having episodes of hypotension.  On my evaluation he is asleep but moaning.  I called his daughter- we discussed transition to full comfort measures and she states that really is their priority.  I was able to wake patient and discuss transition to full comfort with him- no more labs, no more IV fluids, no more antibiotics- and allow for more aggressive pain control and natural dying process with possible placement at residential hospice. Charles Mcmahon smiled broadly at this and said, "that sounds great!"   ROS  Length of Stay: 4  Current Medications: Scheduled Meds:  . acetaminophen  650 mg Oral Q6H  . allopurinol  100 mg Oral BID  . colchicine  0.6 mg Oral Daily  . fentaNYL  1 patch Transdermal Q72H  . LORazepam  2 mg Oral QHS  . morphine CONCENTRATE  10 mg Oral Q6H    Continuous Infusions:   PRN Meds: acetaminophen, ipratropium-albuterol, LORazepam, morphine injection, ondansetron **OR** ondansetron (ZOFRAN) IV, polyethylene glycol, senna-docusate  Physical Exam Vitals and nursing note reviewed.  Constitutional:      Appearance: He is ill-appearing.  Musculoskeletal:        General: Swelling and tenderness present.  Skin:    Coloration: Skin is  jaundiced.  Neurological:     Comments: Lethargic but arouses easily             Vital Signs: BP 99/72   Pulse 69   Temp 97.7 F (36.5 C)   Resp 19   Ht 5\' 9"  (1.753 m)   Wt 81 kg   SpO2 99%   BMI 26.37 kg/m  SpO2: SpO2: 99 % O2 Device: O2 Device: Room Air O2 Flow Rate: O2 Flow Rate (L/min): 7 L/min  Intake/output summary:   Intake/Output Summary (Last 24 hours) at 11/22/2019 1535 Last data filed at 11/21/2019  2025 Gross per 24 hour  Intake --  Output 500 ml  Net -500 ml  10Baseline Weight: Weight: 78.3 kg Most recent weight: Weight: 81 kg       Palliative Assessment/Data: PPS: 10%      Patient Active Problem List   Diagnosis Date Noted  . Community acquired pneumonia   . Advanced care planning/counseling discussion   . Goals of care, counseling/discussion   . DNR (do not resuscitate)   . Palliative care by specialist   . AKI (acute kidney injury) ([place])   . Acute on chronic combined systolic and diastolic CHF (congestive heart failure) ([place])   . Fall against sharp object, sequela 11/18/2019  . Rhabdomyolysis 12/12/2019  . Fall at home, initial encounter 12/09/2019  . Pneumonia 11/19/2019  . Abnormal LFTs (liver function tests) 11/03/2019  . TIA (transient ischemic attack) 11/03/2019  . HFrEF (heart failure with reduced ejection fraction) ([place]) 11/03/2019  . Acute encephalopathy 09/07/2019  . Chronic heart failure ([place]) 08/07/2019  . 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 Heart block AV complete ([place]) 08/07/2019  . 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 Nonischemic cardiomyopathy ([place]) 08/07/2019  . CKD (chronic kidney disease) stage 4, GFR 15-29 ml/min (HCC) 08/07/2019  . Hypertension with heart disease 06/17/2019  . Elevated alkaline phosphatase level 06/17/2019  . Kidney disease 06/17/2019  . Permanent atrial fibrillation ([place]) 06/17/2019    Palliative Care Assessment & Plan   Patient Profile: 74 y.o. 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 He was prolonged down and workup revealed rhabdomyelosis. CT chest showed dense consolidation in the R middle and bilateral lower lobes being treated for pneumonia. Palliative medicine consulted for goals of care.    Assessment/Recommendations/Plan   Charles Mcmahon declining overnight- wishes to transition to full comfort care- stop antibiotics, IV fluids, heart medications, no further labs- discussed with Dr. Haroldine Laws and Dr. Cyndia Skeeters- in agreement  Will start Fentanyl 97mcg patch for diffuse ongoing pain  Scheduled concentrated liquid morphine 10mg  sublingual for 6 doses to allow for fentanyl patch to take effect  Morphine 4mg  IV q1hr prn for breakthrough pain or dyspnea  Lorazepam 1mg  oral every 4hours prn anxiety  Lorzepam 2mg  QHS for sleep and adjuvant pain control  Acetaminophen 650mg  q6 hours for adjuvant pain control  No further labs, IV antibiotics or IV fluids, d/c medications that are not required for comfort  Referral for hopeful placement at Residential Hospice- family requests [facility]  Goals of Care and Additional Recommendations:  Limitations on Scope of Treatment: Full Comfort Care  Code Status:  DNR  Prognosis:   < 2 weeks due to pneumonia with no further plans for treatment, advanced cardiac amyloidosis, pt not eating, sleeping more than awake, need for aggressive pain control  Discharge Planning:  Hospice facility  Care plan was discussed with patient, his daughter, Dr. Haroldine Laws and Dr. Cyndia Skeeters.   Thank you for allowing the Palliative Medicine Team to assist in the care of this patient.   Time In: 1430 Time Out: 1550 Total Time 80 minutes Prolonged Time Billed yes      Greater than 50%  of this time was spent counseling and coordinating care related to the  above assessment and plan.  Mariana Kaufman, AGNP-C Palliative Medicine   Please contact Palliative Medicine Team phone  at [phone] for questions and concerns.

## 2019-11-22 NOTE — Progress Notes (Addendum)
Pt had incontinence episode this shift, pulled off condom cath, pulled off gown. Pt's bedsheets were wet, bottom sheet, top sheet. Pt was given PRN roxi per MAR, but was ineffective. Pt was in great distress and discomfort with touching, turning, and peri care. On call paged.  0315: One time MS via IV given; pt still in unrelenting pain. Generalized pitting edema tender to the touch. Left leg painful to move.   0510: Pt used call bell; states that he is glued to the bed. Repositioned and turned to comfort.

## 2019-11-22 NOTE — Progress Notes (Signed)
Manufacturing engineer [facility])  Hospital Liaison RN note @3 :50pm  Received request from [name] for family interest in [facility]. Chart reviewed and spoke with pt's daughter [name] to acknowledge referral.  Unfortunately [facility] is not able to offer a room today. Family and CSW are aware [place] liaison will follow up with CSW and family tomorrow or sooner if room becomes available.   Please do not hesitate to call with questions.  Thank you. ?  Ellie Lunch [facility] Liaison  [place] are on AMION

## 2019-11-23 DIAGNOSIS — Z515 Encounter for palliative care: Secondary | ICD-10-CM

## 2019-11-23 DIAGNOSIS — Z66 Do not resuscitate: Secondary | ICD-10-CM

## 2019-11-23 MED ORDER — MORPHINE 100MG IN NS 100ML (1MG/ML) PREMIX INFUSION
2.0000 mg/h | INTRAVENOUS | Status: DC
  Administered 2019-11-23: 2 mg/h via INTRAVENOUS
  Filled 2019-11-23 (×2): qty 100

## 2019-11-23 MED ORDER — ONDANSETRON 4 MG PO TBDP: 4.0000 mg | ORAL_TABLET | Freq: Three times a day (TID) | ORAL | 0 refills | Status: AC | PRN

## 2019-11-23 MED ORDER — GLYCOPYRROLATE 1 MG PO TABS: 1.0000 mg | ORAL_TABLET | Freq: Three times a day (TID) | ORAL | 0 refills | Status: AC | PRN

## 2019-11-23 MED ORDER — ACETAMINOPHEN 325 MG PO TABS: 650.0000 mg | ORAL_TABLET | Freq: Four times a day (QID) | ORAL | Status: AC

## 2019-11-23 MED ORDER — LORAZEPAM 1 MG PO TABS: 1.0000 mg | ORAL_TABLET | ORAL | 0 refills | Status: AC | PRN

## 2019-11-23 MED ORDER — LORAZEPAM 2 MG/ML IJ SOLN
2.0000 mg | Freq: Every day | INTRAMUSCULAR | Status: DC
  Administered 2019-11-24: 2 mg via INTRAVENOUS
  Filled 2019-11-23 (×2): qty 1

## 2019-11-23 MED ORDER — MORPHINE BOLUS VIA INFUSION
2.0000 mg | INTRAVENOUS | Status: DC | PRN
  Filled 2019-11-23: qty 2

## 2019-11-23 MED ORDER — MORPHINE BOLUS VIA INFUSION
1.0000 mg | INTRAVENOUS | Status: DC | PRN
  Filled 2019-11-23: qty 2

## 2019-11-23 MED ORDER — LORAZEPAM 2 MG/ML IJ SOLN: 1.0000 mg | INTRAMUSCULAR | Status: DC | PRN

## 2019-11-23 MED ORDER — COLCHICINE 0.6 MG PO TABS: 0.6000 mg | ORAL_TABLET | Freq: Every day | ORAL | 0 refills | Status: AC

## 2019-11-23 MED ORDER — MORPHINE SULFATE (CONCENTRATE) 10 MG /0.5 ML PO SOLN: 10.0000 mg | ORAL | 0 refills | Status: AC | PRN

## 2019-11-23 NOTE — TOC Progression Note (Signed)
Transition of Care [facility]) - Progression Note    Patient Details  Name: Charles Mcmahon MRN: [id number] Date of Birth: [birth year]  Transition of Care [facility]) CM/SW [place], [place] Phone Number: 11/23/2019, 12:04 PM  Clinical Narrative:     No beds available today at [facility]. CSW will reach out to [place] staff on Monday to see if there have been any changes. CSW will continue to follow and assist with disposition planning.   Expected Discharge Plan: [place] Barriers to Discharge: Hospice Bed not available  Expected Discharge Plan and Services Expected Discharge Plan: [place]     Post Acute Care Choice: Home Health                             HH Arranged: RN, PT, Nurse's Aide, Social Work [facility] Agency: [facility] Date HH Agency Contacted: 11/20/19 Time [place]: 1150 Representative spoke with at [place]: Cassie   Social Determinants of Health ([place]) Interventions    Readmission Risk Interventions No flowsheet data found.

## 2019-11-23 NOTE — Progress Notes (Signed)
  Patient transitioning to comfort care. Appreciate Palliative team greatly for help with his discomfort.   We will sign off. Please call with questions.   Glori Bickers, MD  11:15 AM

## 2019-11-23 NOTE — Progress Notes (Signed)
Daily Progress Note   Patient Name: Charles Mcmahon       Date: 11/23/2019 DOB: March 22, 1946  Age: 74 y.o. MRN#: [id number] Attending Physician: Mercy Riding, MD Primary Care Physician: [name], [name], MD Admit Date: 12/05/2019  Reason for Consultation/Follow-up: Non pain symptom management, Pain control and Psychosocial/spiritual support  Subjective: Patient sleeping and moaning.    Spoke with his daughter Lattie Haw on the phone.  She is concerned about his pain and wants to ensure he receives his pain medication.  We discussed utilizing a morphine gtt and she is very much in favor of doing so.   We discussed family members who would visit.  Discussed with bedside RN who shared that patient is too sleepy to take oral medications.  She appreciates a morphine gtt as well.    Assessment: Patient is full comfort, lethargic, and appears to be in the process of dying.   Patient Profile/HPI:  74 y.o. 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 He was prolonged down and workup revealed rhabdomyelosis. CT chest showed dense consolidation in the R middle and bilateral lower lobes being treated for pneumonia. Palliative medicine consulted for goals of care.    Length of Stay: 5  Current Medications: Scheduled Meds:  . acetaminophen  650 mg Oral Q6H  . allopurinol  100 mg Oral BID  . colchicine  0.6 mg Oral Daily  . fentaNYL  1 patch Transdermal Q72H  . LORazepam  2 mg Oral QHS    Continuous Infusions: . morphine      PRN Meds: ipratropium-albuterol, LORazepam, morphine injection, morphine, ondansetron **OR** ondansetron (ZOFRAN) IV,  polyethylene glycol, senna-docusate  Physical Exam       Well developed male,  Very lethargic, actively dying CV reg rate irreg rhythm Resp decreased sounds Abdomen distended but not tight Lower ext warm to touch  Vital Signs: BP (!) 95/53 (BP Location: Right Arm)   Pulse 79   Temp 98.3 F (36.8 C)   Resp 11   Ht 5\' 9"  (1.753 m)   Wt 81 kg   SpO2 94%   BMI 26.37 kg/m  SpO2: SpO2: 94 % O2 Device:  O2 Device: Room Air O2 Flow Rate: O2 Flow Rate (L/min): 7 L/min  Intake/output summary:   Intake/Output Summary (Last 24 hours) at 11/23/2019 1015 Last data filed at 11/22/2019 2236 Gross per 24 hour  Intake --  Output 800 ml  Net -800 ml   LBM: Last BM Date: 11/16/19 Baseline Weight: Weight: 78.3 kg Most recent weight: Weight: 81 kg       Palliative Assessment/Data: 10%      Patient Active Problem List   Diagnosis Date Noted  . Terminal care   . Comfort measures only status   . Community acquired pneumonia   . Advanced care planning/counseling discussion   . Goals of care, counseling/discussion   . DNR (do not resuscitate)   . Palliative care by specialist   . AKI (acute kidney injury) ([place])   . Acute on chronic combined systolic and diastolic CHF (congestive heart failure) ([place])   . Fall against sharp object, sequela 11/18/2019  . Rhabdomyolysis 12/04/2019  . Fall at home, initial encounter 11/21/2019  . Pneumonia 11/28/2019  . Abnormal LFTs (liver function tests) 11/03/2019  . TIA (transient ischemic attack) 11/03/2019  . HFrEF (heart failure with reduced ejection fraction) ([place]) 11/03/2019  . Acute encephalopathy 09/07/2019  . Chronic heart failure ([place]) 08/07/2019  . 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 Heart block AV complete ([place]) 08/07/2019  . 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 Nonischemic cardiomyopathy ([place]) 08/07/2019  . CKD (chronic kidney disease) stage 4, GFR 15-29 ml/min (HCC) 08/07/2019  .  Hypertension with heart disease 06/17/2019  . Elevated alkaline phosphatase level 06/17/2019  . Kidney disease 06/17/2019  . Permanent atrial fibrillation ([place]) 06/17/2019    Palliative Care Plan    Recommendations/Plan:  Will dc oral morphine and shift to morphine gtt with PRN boluses  Will dc oral meds as patient is too lethargic to take.  Replace with IV.  Family is allowed to visit at patient is at end of life.  Names of 4 family members given to security.  Waiting for bed at [facility].  Safe for transport when bed available.  Goals of Care and Additional Recommendations:  Limitations on Scope of Treatment: Full Comfort Care  Code Status:  DNR  Prognosis:   Hours - Days   Discharge Planning:  Hospice facility vs Hospital death  Care plan was discussed with daughter and bedside RN.  Thank you for allowing the Palliative Medicine Team to assist in the care of this patient.  Total time spent:  35 min.     Greater than 50%  of this time was spent counseling and coordinating care related to the above assessment and plan.  Florentina Jenny, PA-C Palliative Medicine  Please contact Palliative MedicineTeam phone at [phone] for questions and concerns between 7 am - 7 pm.   Please see AMION for individual provider pager numbers.

## 2019-11-23 NOTE — Progress Notes (Signed)
PROGRESS NOTE  Charles Mcmahon BDZ:[id number] DOB: 05/12/1946   PCP: [name], [name], MD  Patient is from: home.  Uses cane at baseline.  DOA: 12/02/2019 LOS: 5  Brief Narrative / Interim history: 74 year old with history of complete heart block status post pacemaker, persistent A. fib, systolic CHF EF 34%, CKD stage IV presented with fall found to be in rhabdo.  CT showed dense consolidation in the right middle and bilateral lower lobes.  Diagnosed with rhabdomyolysis and community-acquired pneumonia.  Rhabdomyolysis resolved.  Completed antibiotic course with ceftriaxone and azithromycin for community-acquired pneumonia. Cardiology/advanced HF team consulted for his advanced heart failure and amyloidosis.  He was deemed to have very poor prognosis due to his advanced heart failure and cardiac amyloidosis with very limited treatment options.  Cardiology recommended palliative care consult, who met with patient and patient's daughter.  Eventually, transitioned to full comfort care on 11/24/2019.  Waiting on hospice bed.  Subjective: No major events overnight or this morning.  Sleeping comfortably this morning.  No apparent distress.  Objective: Vitals:   11/22/19 0500 11/22/19 0812 11/22/19 2241 11/23/19 0322  BP:  99/72 (!) 95/53   Pulse:  69 79   Resp:  '19 16 11  '$ Temp:  97.7 F (36.5 C) 98.3 F (36.8 C)   TempSrc:      SpO2:  99% 94%   Weight: 81 kg     Height:        Intake/Output Summary (Last 24 hours) at 11/23/2019 1517 Last data filed at 11/22/2019 2236 Gross per 24 hour  Intake --  Output 800 ml  Net -800 ml   Filed Weights   11/20/19 1300 11/22/19 0500  Weight: 78.3 kg 81 kg    Examination:  GENERAL: Sleeping comfortably.  No apparent distress. NECK: Supple.  No apparent JVD.  RESP: On room air.  No IWOB.  MSK/EXT: Bilateral lower extremity edema. NEURO: Sleeping comfortably. PSYCH: No distress or agitation.   Procedures:  None  Assessment & Plan: End-of-life  care/comfort measures only/DNR/DNI -Fall comfort measures initiated on 2/6. -Waiting on hospice bed.  Chronic systolic: Echo in 19/6222 with EF of 20 to 25%, severe LVH, severely decreased RVSP.  No significant improvement with aggressive diuresis.  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  Per cardiology, very limited treatment option at this stage.  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  Fall of unknown etiology at home: No arrhythmia on pacemaker interrogation.  Concern about orthostatic hypotension.  CT head without acute finding.  No focal neuro finding on exam.    Rhabdomyolysis: Due to the above.  CK 2260> 2485> 612> 314  Multifocal community-acquired pneumonia: Procalcitonin elevated.  COVID-19 negative.  CXR concerning for RML and bilateral LLL pneumonia. Ceftriaxone and azithromycin 2/1>> 2/6  Acute metabolic encephalopathy:  CT negative, B12 and TSH not revealing.  CKD-4: Baseline Cr1.7> 2.06 (admit)> 1.74>> 1.47> 1.59.  Anemia of chronic disease: Hgb relatively stable.  Elevated LFTs/ALP/total bili: Likely due to rhabdomyolysis.  No GI symptoms.  Left upper extremity wound: POA                DVT prophylaxis: None Code Status: DNR/DNI Family Communication: Patient and/or RN. Available if any question. Barriers to discharge: Hospice bed Anticipated disposition: Hospice facility Consultants: Advanced HF team, palliative care   Microbiology summarized: Influenza PCR negative. COVID-19 PCR negative.  Sch Meds:  Scheduled Meds: . fentaNYL  1 patch Transdermal  Q72H  . LORazepam  2 mg Intravenous QHS   Continuous Infusions: . morphine 2 mg/hr (11/23/19 1148)   PRN Meds:.ipratropium-albuterol, LORazepam, morphine injection, morphine, ondansetron **OR** ondansetron (ZOFRAN) IV  Antimicrobials: Anti-infectives (From admission, onward)   Start     Dose/Rate  Route Frequency Ordered Stop   11/20/19 1800  azithromycin (ZITHROMAX) tablet 250 mg     250 mg Oral Every 24 hours 11/19/19 1855 11/21/19 1713   11/18/19 1700  azithromycin (ZITHROMAX) 500 mg in sodium chloride 0.9 % 250 mL IVPB  Status:  Discontinued     500 mg 250 mL/hr over 60 Minutes Intravenous Every 24 hours 11/30/2019 1645 11/19/19 1855   11/18/19 1600  cefTRIAXone (ROCEPHIN) 1 g in sodium chloride 0.9 % 100 mL IVPB  Status:  Discontinued     1 g 200 mL/hr over 30 Minutes Intravenous Every 24 hours 12/10/2019 1645 11/22/19 1524   12/08/2019 1545  cefTRIAXone (ROCEPHIN) 1 g in sodium chloride 0.9 % 100 mL IVPB     1 g 200 mL/hr over 30 Minutes Intravenous  Once 12/02/2019 1542 11/19/2019 1713   11/19/2019 1545  azithromycin (ZITHROMAX) 500 mg in sodium chloride 0.9 % 250 mL IVPB     500 mg 250 mL/hr over 60 Minutes Intravenous  Once 11/28/2019 1542 11/20/2019 2051       I have personally reviewed the following labs and images: CBC: Recent Labs  Lab 12/04/2019 1029 11/18/19 0250 11/21/19 0231 11/22/19 0314  WBC 10.0 10.0  --   --   NEUTROABS 8.9*  --   --   --   HGB 13.6 12.1* 12.2* 12.8*  HCT 43.3 37.9* 39.3 40.5  MCV 98.4 97.2  --   --   PLT 281 249  --   --    BMP &GFR Recent Labs  Lab 11/19/19 0237 11/20/19 0259 11/20/19 1447 11/21/19 0231 11/22/19 0314  NA 140 135 137 136 136  K 4.7 5.7* 4.0 3.9 4.5  CL 101 100 99 98 97*  CO2 '27 22 26 25 24  '$ GLUCOSE 102* 100* 126* 113* 111*  BUN 57* 53* 48* 46* 39*  CREATININE 1.74* 1.65* 1.49* 1.47* 1.59*  CALCIUM 8.8* 8.8* 8.8* 8.8* 8.9  MG 2.2 2.5*  --  2.2 2.2   Estimated Creatinine Clearance: 40.8 mL/min (A) (by C-G formula based on SCr of 1.59 mg/dL (H)). Liver & Pancreas: Recent Labs  Lab 11/19/19 0237 11/20/19 0259 11/21/19 0231  AST 120* 118* 75*  ALT 46* 50* 47*  ALKPHOS 320* 411* 484*  BILITOT 1.2 1.6* 1.0  PROT 4.8* 4.9* 5.1*  ALBUMIN 2.2* 2.4* 2.4*   No results for input(s): LIPASE, AMYLASE in the last 168  hours. No results for input(s): AMMONIA in the last 168 hours. Diabetic: No results for input(s): HGBA1C in the last 72 hours. No results for input(s): GLUCAP in the last 168 hours. Cardiac Enzymes: Recent Labs  Lab 11/26/2019 1029 11/18/19 0250 11/19/19 1245 11/20/19 0259 11/21/19 0231  CKTOTAL 2,260* 2,485* 1,236* 612* 315   Recent Labs    05/27/19 1318 06/17/19 0938  PROBNP 755.0* 661.0*   Coagulation Profile: No results for input(s): INR, PROTIME in the last 168 hours. Thyroid Function Tests: No results for input(s): TSH, T4TOTAL, FREET4, T3FREE, THYROIDAB in the last 72 hours. Lipid Profile: No results for input(s): CHOL, HDL, LDLCALC, TRIG, CHOLHDL, LDLDIRECT in the last 72 hours. Anemia Panel: No results for input(s): VITAMINB12, FOLATE, FERRITIN, TIBC, IRON, RETICCTPCT in the last 72 hours.  Urine analysis:    Component Value Date/Time   COLORURINE YELLOW 09/08/2019 [place] 09/08/2019 0947   LABSPEC 1.021 09/08/2019 0947   PHURINE 5.0 09/08/2019 0947   GLUCOSEU NEGATIVE 09/08/2019 0947   GLUCOSEU NEGATIVE 05/27/2019 1318   HGBUR NEGATIVE 09/08/2019 0947   BILIRUBINUR NEGATIVE 09/08/2019 0947   KETONESUR NEGATIVE 09/08/2019 0947   PROTEINUR 30 (A) 09/08/2019 0947   UROBILINOGEN 0.2 05/27/2019 1318   NITRITE NEGATIVE 09/08/2019 0947   LEUKOCYTESUR NEGATIVE 09/08/2019 0947   Sepsis Labs: Invalid input(s): PROCALCITONIN, [place]  Microbiology: Recent Results (from the past 240 hour(s))  Respiratory Panel by RT PCR (Flu A&B, Covid) - Nasopharyngeal Swab     Status: None   Collection Time: 11/19/2019  3:51 PM   Specimen: Nasopharyngeal Swab  Result Value Ref Range Status   SARS Coronavirus 2 by RT PCR NEGATIVE NEGATIVE Final    Comment: (NOTE) SARS-CoV-2 target nucleic acids are NOT DETECTED. The SARS-CoV-2 RNA is generally detectable in upper respiratoy specimens during the acute phase of infection. The lowest concentration of SARS-CoV-2  viral copies this assay can detect is 131 copies/mL. A negative result does not preclude SARS-Cov-2 infection and should not be used as the sole basis for treatment or other patient management decisions. A negative result may occur with  improper specimen collection/handling, submission of specimen other than nasopharyngeal swab, presence of viral mutation(s) within the areas targeted by this assay, and inadequate number of viral copies (<131 copies/mL). A negative result must be combined with clinical observations, patient history, and epidemiological information. The expected result is Negative. Fact Sheet for Patients:  PinkCheek.be Fact Sheet for Healthcare Providers:  GravelBags.it This test is not yet ap proved or cleared by the [place] FDA and  has been authorized for detection and/or diagnosis of SARS-CoV-2 by FDA under an Emergency Use Authorization (EUA). This EUA will remain  in effect (meaning this test can be used) for the duration of the COVID-19 declaration under Section 564(b)(1) of the Act, 21 U.S.C. section 360bbb-3(b)(1), unless the authorization is terminated or revoked sooner.    Influenza A by PCR NEGATIVE NEGATIVE Final   Influenza B by PCR NEGATIVE NEGATIVE Final    Comment: (NOTE) The Xpert Xpress SARS-CoV-2/FLU/RSV assay is intended as an aid in  the diagnosis of influenza from Nasopharyngeal swab specimens and  should not be used as a sole basis for treatment. Nasal washings and  aspirates are unacceptable for Xpert Xpress SARS-CoV-2/FLU/RSV  testing. Fact Sheet for Patients: PinkCheek.be Fact Sheet for Healthcare Providers: GravelBags.it This test is not yet approved or cleared by the [place] FDA and  has been authorized for detection and/or diagnosis of SARS-CoV-2 by  FDA under an Emergency Use Authorization (EUA). This EUA will  remain  in effect (meaning this test can be used) for the duration of the  Covid-19 declaration under Section 564(b)(1) of the Act, 21  U.S.C. section 360bbb-3(b)(1), unless the authorization is  terminated or revoked. Performed at [facility], [place] [address] [place], [place] [address]     Radiology Studies: No results found.    Donyale Falcon T. [place]  If 7PM-7AM, please contact night-coverage [email or web address] Password [facility] 11/23/2019, 3:17 PM

## 2019-11-23 NOTE — Progress Notes (Signed)
Manufacturing engineer [facility])  Hospital Liaison RN note @3 :40pm  Liaison attempted to a outreach call to daughter Lattie Haw) today however only able to leave a message. Unfortunately [employer] [facility] is not able to offer a room today. Family and CSW are aware [place] liaison will follow up with CSW and family tomorrow or sooner if room becomes available.   Please do not hesitate to call with questions.  Thank you.?  Ellie Lunch [facility] Liaison [place] are on AMION

## 2019-11-23 NOTE — Evaluation (Signed)
Patient LOC assessed to voice only. Respiratory rate 8, speech slurred, responses delayed. Morphine 2mg /hr cont. Held.  Physician notified.

## 2019-11-24 MED ORDER — GLYCOPYRROLATE 0.2 MG/ML IJ SOLN: 0.2000 mg | INTRAMUSCULAR | Status: DC | PRN

## 2019-11-24 MED ORDER — GLYCOPYRROLATE 0.2 MG/ML IJ SOLN
0.3000 mg | Freq: Three times a day (TID) | INTRAMUSCULAR | Status: DC
  Administered 2019-11-24 (×3): 0.3 mg via INTRAVENOUS
  Filled 2019-11-24 (×3): qty 2

## 2019-11-24 MED ORDER — ONDANSETRON 4 MG PO TBDP: 4.0000 mg | ORAL_TABLET | Freq: Four times a day (QID) | ORAL | Status: DC | PRN

## 2019-11-24 MED ORDER — HALOPERIDOL LACTATE 5 MG/ML IJ SOLN: 0.5000 mg | INTRAMUSCULAR | Status: DC | PRN

## 2019-11-24 MED ORDER — HALOPERIDOL LACTATE 2 MG/ML PO CONC
0.5000 mg | ORAL | Status: DC | PRN
  Filled 2019-11-24: qty 0.3

## 2019-11-24 MED ORDER — BIOTENE DRY MOUTH MT LIQD: 15.0000 mL | OROMUCOSAL | Status: DC | PRN

## 2019-11-24 MED ORDER — GLYCOPYRROLATE 0.2 MG/ML IJ SOLN
0.2000 mg | INTRAMUSCULAR | Status: DC | PRN
  Administered 2019-11-25: 0.2 mg via INTRAVENOUS
  Filled 2019-11-24: qty 1

## 2019-11-24 MED ORDER — HALOPERIDOL 0.5 MG PO TABS: 0.5000 mg | ORAL_TABLET | ORAL | Status: DC | PRN

## 2019-11-24 MED ORDER — POLYVINYL ALCOHOL 1.4 % OP SOLN: 1.0000 [drp] | Freq: Four times a day (QID) | OPHTHALMIC | Status: DC | PRN

## 2019-11-24 MED ORDER — GLYCOPYRROLATE 1 MG PO TABS: 1.0000 mg | ORAL_TABLET | ORAL | Status: DC | PRN

## 2019-11-24 MED ORDER — ONDANSETRON HCL 4 MG/2ML IJ SOLN: 4.0000 mg | Freq: Four times a day (QID) | INTRAMUSCULAR | Status: DC | PRN

## 2019-11-24 NOTE — Progress Notes (Signed)
PROGRESS NOTE  Charles Mcmahon GHW:[id number] DOB: 18-Oct-1945   PCP: [name], [name], MD  Patient is from: home.  Uses cane at baseline.  DOA: 12/01/2019 LOS: 6  Brief Narrative / Interim history: 74 year old with history of complete heart block status post pacemaker, persistent A. fib, systolic CHF EF 78%, CKD stage IV presented with fall found to be in rhabdo.  CT showed dense consolidation in the right middle and bilateral lower lobes.  Diagnosed with rhabdomyolysis and community-acquired pneumonia.  Rhabdomyolysis resolved.  Completed antibiotic course with ceftriaxone and azithromycin for community-acquired pneumonia. Cardiology/advanced HF team consulted for his advanced heart failure and amyloidosis.  He was deemed to have very poor prognosis due to his advanced heart failure and cardiac amyloidosis with very limited treatment options.  Cardiology recommended palliative care consult, who met with patient and patient's daughter.  Eventually, transitioned to full comfort care on 11/24/2019.  Waiting on hospice bed.  Subjective: No major events overnight or this morning.  Awake this morning.  Somewhat disoriented.  Denies pain.  No apparent distress.  On morphine drip.  Objective: Vitals:   11/23/19 1958 11/23/19 2100 11/24/19 0334 11/24/19 0850  BP: 103/77  122/68 131/87  Pulse: 76  66 75  Resp: 18 (!) '8 18 18  '$ Temp: 98.7 F (37.1 C)  98.5 F (36.9 C) 97.9 F (36.6 C)  TempSrc: Oral  Oral   SpO2: 90% 95% 96% 97%  Weight:      Height:        Intake/Output Summary (Last 24 hours) at 11/24/2019 1744 Last data filed at 11/24/2019 0852 Gross per 24 hour  Intake --  Output 550 ml  Net -550 ml   Filed Weights   11/20/19 1300 11/22/19 0500  Weight: 78.3 kg 81 kg    Examination:  GENERAL: No apparent distress. NECK: Supple.  No apparent JVD.  RESP: On room air.  No IWOB.  MSK/EXT: Bilateral lower extremity edema. NEURO: Awake.  Disoriented. PSYCH: No distress or  agitation.   Procedures:  None  Assessment & Plan: End-of-life care/comfort measures only/DNR/DNI -Fall comfort measures initiated on 2/6. -Anticipate in-hospital death  Chronic systolic: Echo in 93/8101 with EF of 20 to 25%, severe LVH, severely decreased RVSP.  No significant improvement with aggressive diuresis.  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  Per cardiology, very limited treatment option at this stage.  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  Fall of unknown etiology at home: No arrhythmia on pacemaker interrogation.  Concern about orthostatic hypotension.  CT head without acute finding.  No focal neuro finding on exam.    Rhabdomyolysis: Due to the above.  CK 2260> 2485> 612> 314  Multifocal community-acquired pneumonia: Procalcitonin elevated.  COVID-19 negative.  CXR concerning for RML and bilateral LLL pneumonia. Ceftriaxone and azithromycin 2/1>> 2/6  Acute metabolic encephalopathy:  CT negative, B12 and TSH not revealing.  CKD-4: Baseline Cr1.7> 2.06 (admit)> 1.74>> 1.47> 1.59.  Anemia of chronic disease: Hgb relatively stable.  Elevated LFTs/ALP/total bili: Likely due to rhabdomyolysis.  No GI symptoms.  Left upper extremity wound: POA                DVT prophylaxis: None Code Status: DNR/DNI Family Communication: Patient and/or RN.  Barriers to discharge: In hospital death anticipated. Consultants: Advanced HF team (off), palliative care (off)   Microbiology summarized: Influenza PCR negative. COVID-19 PCR negative.  Sch Meds:  Scheduled Meds: .  fentaNYL  1 patch Transdermal Q72H  . glycopyrrolate  0.3 mg Intravenous TID  . LORazepam  2 mg Intravenous QHS   Continuous Infusions: . morphine 2 mg/hr (11/24/19 0656)   PRN Meds:.antiseptic oral rinse, glycopyrrolate **OR** glycopyrrolate **OR** glycopyrrolate, haloperidol **OR**  haloperidol **OR** haloperidol lactate, ipratropium-albuterol, LORazepam, morphine, ondansetron **OR** ondansetron (ZOFRAN) IV, polyvinyl alcohol  Antimicrobials: Anti-infectives (From admission, onward)   Start     Dose/Rate Route Frequency Ordered Stop   11/20/19 1800  azithromycin (ZITHROMAX) tablet 250 mg     250 mg Oral Every 24 hours 11/19/19 1855 11/21/19 1713   11/18/19 1700  azithromycin (ZITHROMAX) 500 mg in sodium chloride 0.9 % 250 mL IVPB  Status:  Discontinued     500 mg 250 mL/hr over 60 Minutes Intravenous Every 24 hours 12/10/2019 1645 11/19/19 1855   11/18/19 1600  cefTRIAXone (ROCEPHIN) 1 g in sodium chloride 0.9 % 100 mL IVPB  Status:  Discontinued     1 g 200 mL/hr over 30 Minutes Intravenous Every 24 hours 11/28/2019 1645 11/22/19 1524   12/14/2019 1545  cefTRIAXone (ROCEPHIN) 1 g in sodium chloride 0.9 % 100 mL IVPB     1 g 200 mL/hr over 30 Minutes Intravenous  Once 12/03/2019 1542 12/10/2019 1713   12/01/2019 1545  azithromycin (ZITHROMAX) 500 mg in sodium chloride 0.9 % 250 mL IVPB     500 mg 250 mL/hr over 60 Minutes Intravenous  Once 12/11/2019 1542 11/19/2019 2051       I have personally reviewed the following labs and images: CBC: Recent Labs  Lab 11/18/19 0250 11/21/19 0231 11/22/19 0314  WBC 10.0  --   --   HGB 12.1* 12.2* 12.8*  HCT 37.9* 39.3 40.5  MCV 97.2  --   --   PLT 249  --   --    BMP &GFR Recent Labs  Lab 11/19/19 0237 11/20/19 0259 11/20/19 1447 11/21/19 0231 11/22/19 0314  NA 140 135 137 136 136  K 4.7 5.7* 4.0 3.9 4.5  CL 101 100 99 98 97*  CO2 '27 22 26 25 24  '$ GLUCOSE 102* 100* 126* 113* 111*  BUN 57* 53* 48* 46* 39*  CREATININE 1.74* 1.65* 1.49* 1.47* 1.59*  CALCIUM 8.8* 8.8* 8.8* 8.8* 8.9  MG 2.2 2.5*  --  2.2 2.2   Estimated Creatinine Clearance: 40.8 mL/min (A) (by C-G formula based on SCr of 1.59 mg/dL (H)). Liver & Pancreas: Recent Labs  Lab 11/19/19 0237 11/20/19 0259 11/21/19 0231  AST 120* 118* 75*  ALT 46* 50* 47*   ALKPHOS 320* 411* 484*  BILITOT 1.2 1.6* 1.0  PROT 4.8* 4.9* 5.1*  ALBUMIN 2.2* 2.4* 2.4*   No results for input(s): LIPASE, AMYLASE in the last 168 hours. No results for input(s): AMMONIA in the last 168 hours. Diabetic: No results for input(s): HGBA1C in the last 72 hours. No results for input(s): GLUCAP in the last 168 hours. Cardiac Enzymes: Recent Labs  Lab 11/18/19 0250 11/19/19 1245 11/20/19 0259 11/21/19 0231  CKTOTAL 2,485* 1,236* 612* 315   Recent Labs    05/27/19 1318 06/17/19 0938  PROBNP 755.0* 661.0*   Coagulation Profile: No results for input(s): INR, PROTIME in the last 168 hours. Thyroid Function Tests: No results for input(s): TSH, T4TOTAL, FREET4, T3FREE, THYROIDAB in the last 72 hours. Lipid Profile: No results for input(s): CHOL, HDL, LDLCALC, TRIG, CHOLHDL, LDLDIRECT in the last 72 hours. Anemia Panel: No results for input(s): VITAMINB12, FOLATE, FERRITIN, TIBC, IRON, RETICCTPCT  in the last 72 hours. Urine analysis:    Component Value Date/Time   COLORURINE YELLOW 09/08/2019 [place] 09/08/2019 0947   LABSPEC 1.021 09/08/2019 0947   PHURINE 5.0 09/08/2019 0947   GLUCOSEU NEGATIVE 09/08/2019 0947   GLUCOSEU NEGATIVE 05/27/2019 1318   HGBUR NEGATIVE 09/08/2019 0947   BILIRUBINUR NEGATIVE 09/08/2019 0947   KETONESUR NEGATIVE 09/08/2019 0947   PROTEINUR 30 (A) 09/08/2019 0947   UROBILINOGEN 0.2 05/27/2019 1318   NITRITE NEGATIVE 09/08/2019 0947   LEUKOCYTESUR NEGATIVE 09/08/2019 0947   Sepsis Labs: Invalid input(s): PROCALCITONIN, [place]  Microbiology: Recent Results (from the past 240 hour(s))  Respiratory Panel by RT PCR (Flu A&B, Covid) - Nasopharyngeal Swab     Status: None   Collection Time: 11/24/2019  3:51 PM   Specimen: Nasopharyngeal Swab  Result Value Ref Range Status   SARS Coronavirus 2 by RT PCR NEGATIVE NEGATIVE Final    Comment: (NOTE) SARS-CoV-2 target nucleic acids are NOT DETECTED. The SARS-CoV-2  RNA is generally detectable in upper respiratoy specimens during the acute phase of infection. The lowest concentration of SARS-CoV-2 viral copies this assay can detect is 131 copies/mL. A negative result does not preclude SARS-Cov-2 infection and should not be used as the sole basis for treatment or other patient management decisions. A negative result may occur with  improper specimen collection/handling, submission of specimen other than nasopharyngeal swab, presence of viral mutation(s) within the areas targeted by this assay, and inadequate number of viral copies (<131 copies/mL). A negative result must be combined with clinical observations, patient history, and epidemiological information. The expected result is Negative. Fact Sheet for Patients:  PinkCheek.be Fact Sheet for Healthcare Providers:  GravelBags.it This test is not yet ap proved or cleared by the [place] FDA and  has been authorized for detection and/or diagnosis of SARS-CoV-2 by FDA under an Emergency Use Authorization (EUA). This EUA will remain  in effect (meaning this test can be used) for the duration of the COVID-19 declaration under Section 564(b)(1) of the Act, 21 U.S.C. section 360bbb-3(b)(1), unless the authorization is terminated or revoked sooner.    Influenza A by PCR NEGATIVE NEGATIVE Final   Influenza B by PCR NEGATIVE NEGATIVE Final    Comment: (NOTE) The Xpert Xpress SARS-CoV-2/FLU/RSV assay is intended as an aid in  the diagnosis of influenza from Nasopharyngeal swab specimens and  should not be used as a sole basis for treatment. Nasal washings and  aspirates are unacceptable for Xpert Xpress SARS-CoV-2/FLU/RSV  testing. Fact Sheet for Patients: PinkCheek.be Fact Sheet for Healthcare Providers: GravelBags.it This test is not yet approved or cleared by the [place] FDA  and  has been authorized for detection and/or diagnosis of SARS-CoV-2 by  FDA under an Emergency Use Authorization (EUA). This EUA will remain  in effect (meaning this test can be used) for the duration of the  Covid-19 declaration under Section 564(b)(1) of the Act, 21  U.S.C. section 360bbb-3(b)(1), unless the authorization is  terminated or revoked. Performed at [facility], [place] [address] [place], [place] [address]     Radiology Studies: No results found.    Tashawna Thom T. [place]  If 7PM-7AM, please contact night-coverage [email or web address] Password [facility] 11/24/2019, 5:44 PM

## 2019-11-24 NOTE — Progress Notes (Signed)
Daily Progress Note   Patient Name: Charles Mcmahon       Date: 11/24/2019 DOB: [birth year]  Age: 74 y.o. MRN#: [id number] Attending Physician: Mercy Riding, MD Primary Care Physician: [name], [name], MD Admit Date: 12/14/2019  Reason for Consultation/Follow-up: Establishing goals of care and Psychosocial/spiritual support  Subjective: Patient resting comfortably actively dying.  Does have secretions that are causing an intermittent cough.   Assessment: Actively dying.  Not appropriate for transfer to [facility].  [facility] death.   Patient Profile/HPI:  74 y.o. 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 He was prolonged down and workup revealed rhabdomyelosis. CT chest showed dense consolidation in the R middle and bilateral lower lobes being treated for pneumonia. Palliative medicine consulted for goals of care.    Length of Stay: 6  Current Medications: Scheduled Meds:  . fentaNYL  1 patch Transdermal Q72H  . glycopyrrolate  0.3 mg Intravenous TID  . LORazepam  2 mg Intravenous QHS    Continuous Infusions: . morphine 2 mg/hr (11/24/19 0656)    PRN Meds: antiseptic oral rinse, glycopyrrolate **OR** glycopyrrolate **OR** glycopyrrolate, haloperidol **OR** haloperidol **OR** haloperidol lactate, ipratropium-albuterol, LORazepam, morphine, ondansetron **OR** ondansetron (ZOFRAN) IV, polyvinyl alcohol  Physical Exam        Well developed male, actively dying.  Does not respond to voice or light touch for exam CV rrr (Paced) Resp shallow.  Secretions build and cause cough Abd soft, nd Ext with edema (decreased  compared to yesterday)  Vital Signs: BP 131/87 (BP Location: Right Wrist)   Pulse 75   Temp 97.9 F (36.6 C)   Resp 18   Ht 5\' 9"  (1.753 m)   Wt 81 kg   SpO2 97%   BMI 26.37 kg/m  SpO2: SpO2: 97 % O2 Device: O2 Device: Room Air O2 Flow Rate: O2 Flow Rate (L/min): 7 L/min  Intake/output summary:   Intake/Output Summary (Last 24 hours) at 11/24/2019 0902 Last data filed at 11/24/2019 5521 Gross per 24 hour  Intake --  Output 550 ml  Net -550 ml   LBM: Last BM Date: 11/16/19 Baseline Weight: Weight: 78.3 kg Most recent  weight: Weight: 81 kg       Palliative Assessment/Data:  10%      Patient Active Problem List   Diagnosis Date Noted  . Palliative care encounter   . Terminal care   . Comfort measures only status   . Community acquired pneumonia   . Advanced care planning/counseling discussion   . Goals of care, counseling/discussion   . DNR (do not resuscitate)   . Palliative care by specialist   . AKI (acute kidney injury) ([place])   . Acute on chronic combined systolic and diastolic CHF (congestive heart failure) ([place])   . Fall against sharp object, sequela 11/18/2019  . Rhabdomyolysis 12/14/2019  . Fall at home, initial encounter 11/26/2019  . Pneumonia 12/12/2019  . Abnormal LFTs (liver function tests) 11/03/2019  . TIA (transient ischemic attack) 11/03/2019  . HFrEF (heart failure with reduced ejection fraction) ([place]) 11/03/2019  . Acute encephalopathy 09/07/2019  . Chronic heart failure ([place]) 08/07/2019  . 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 Heart block AV complete ([place]) 08/07/2019  . 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 Nonischemic cardiomyopathy ([place]) 08/07/2019  . CKD (chronic kidney disease) stage 4, GFR 15-29 ml/min (HCC) 08/07/2019  . Hypertension with heart disease 06/17/2019  . Elevated alkaline phosphatase level 06/17/2019  . Kidney disease 06/17/2019  . Permanent atrial fibrillation ([place]) 06/17/2019     Palliative Care Plan    Recommendations/Plan:  Continue current care - morphine gtt, scheduled ativan, added scheduled robinul  Would not transfer to [facility] at this point actively dying and unsafe to transport.  Will discuss with RN and family.  Family to visit today.  Goals of Care and Additional Recommendations:  Limitations on Scope of Treatment: Full Comfort Care  Code Status:  DNR  Prognosis:   Hours - Days   Discharge Planning:  Anticipated Hospital Death  Care plan was discussed with RN and family.  Thank you for allowing the Palliative Medicine Team to assist in the care of this patient.  Total time spent:  35 min.     Greater than 50%  of this time was spent counseling and coordinating care related to the above assessment and plan.  Florentina Jenny, PA-C Palliative Medicine  Please contact Palliative MedicineTeam phone at [phone] for questions and concerns between 7 am - 7 pm.   Please see AMION for individual provider pager numbers.

## 2019-11-24 NOTE — Progress Notes (Signed)
Patient family at bedside, comfort goals discussed. Oral suction and oral care performed. Reason for starting ROBINUL explained to patient and family, all questions answered. Patient appears to be resting comfortably in bed, no distress noted.

## 2019-11-24 NOTE — Plan of Care (Signed)

## 2019-11-24 NOTE — Progress Notes (Signed)
Spoke w/ Aaron Edelman via phone w/ [facility] - they do not turn pacemakers off.

## 2019-11-24 NOTE — Progress Notes (Addendum)
Pt is resting comfortably in bed. RR were 10 breaths/min. Son was at bedside at shift change and all questions were answered. Morphine gtt is running at 44mL/hr and Ativan and Robinul was administered as scheduled.   0400 Pt has declined since the start of this shift. Pt is breathing around 23 breaths/min, pt's face is now more sunken in and color is becoming more pale. Patient is breathing more shallow with rattles. PRN Robinul has been administered. Pt's daughter Lattie Haw was called with an update with worsening of patient's symptoms. Morphine gtt is still running at 44mL/hr.   0530 Per Palliative care, orders were given for one time dose of 0.4mg  Robinul, 2mg  Bolus of Morphine with drip increase to 4mg . RN talked with family about administering medications, patient's wife refused any more medications including the drip rate increase. Family was educated and Palliative was contacted (voice message left) once again for further follow-up.

## 2019-11-24 NOTE — Progress Notes (Signed)
Spoke with bedside RN who provided attentive care overnight.   I will adjust orders to reflect vital signs only once daily.  Morphine gtt restarted.    Given his presentation over night I would like to assess this morning before considering transport to [facility] as it appears he may be actively dying.  PMT will continue to follow.    Florentina Jenny, PA-C Palliative Medicine Office:  [phone]

## 2019-11-24 NOTE — Plan of Care (Signed)
  Problem: Coping: Goal: Level of anxiety will decrease 11/24/2019 0529 by Loma Messing, RN Outcome: Progressing 11/24/2019 0525 by Karsten Fells D, RN Outcome: Progressing   Problem: Elimination: Goal: Will not experience complications related to bowel motility 11/24/2019 0529 by Loma Messing, RN Outcome: Progressing 11/24/2019 0525 by Loma Messing, RN Outcome: Progressing Goal: Will not experience complications related to urinary retention 11/24/2019 0529 by Loma Messing, RN Outcome: Progressing 11/24/2019 0525 by Loma Messing, RN Outcome: Progressing   Problem: Pain Managment: Goal: General experience of comfort will improve 11/24/2019 0529 by Loma Messing, RN Outcome: Progressing 11/24/2019 0525 by Karsten Fells D, RN Outcome: Progressing   Problem: Safety: Goal: Ability to remain free from injury will improve 11/24/2019 0529 by Loma Messing, RN Outcome: Progressing 11/24/2019 0525 by Karsten Fells D, RN Outcome: Progressing   Problem: Skin Integrity: Goal: Risk for impaired skin integrity will decrease 11/24/2019 0529 by Loma Messing, RN Outcome: Progressing 11/24/2019 0525 by Loma Messing, RN Outcome: Progressing

## 2019-11-25 MED ORDER — GLYCOPYRROLATE 0.2 MG/ML IJ SOLN: 0.6000 mg | Freq: Four times a day (QID) | INTRAMUSCULAR | Status: DC

## 2019-11-25 MED ORDER — GLYCOPYRROLATE 0.2 MG/ML IJ SOLN
0.4000 mg | Freq: Once | INTRAMUSCULAR | Status: DC
  Filled 2019-11-25: qty 2

## 2019-11-25 MED ORDER — MORPHINE BOLUS VIA INFUSION
2.0000 mg | INTRAVENOUS | Status: DC | PRN
  Filled 2019-11-25: qty 4

## 2019-12-03 ENCOUNTER — Encounter (HOSPITAL_COMMUNITY): Payer: Medicare Other | Admitting: Cardiology

## 2019-12-15 NOTE — Progress Notes (Signed)
Amie, RN and myself entered the patients room for change of shift and were notified by family that they believed patient had just passed away. Time of Death was called at 60.

## 2019-12-15 NOTE — Death Summary Note (Signed)
DEATH SUMMARY   Patient Details  Name: Charles Mcmahon MRN: [id number] DOB: 01/11/1946  Admission/Discharge Information   Admit Date:  December 13, 2019  Date of Death: Date of Death: 12-21-2019  Time of Death: Time of Death: 0719  Length of Stay: 01-16-2023  Referring Physician: [name], [name], MD   Reason(s) for Hospitalization  Fall at home and rhabdomyolysis.  Diagnoses  Preliminary cause of death: Acute on chronic combined systolic and diastolic CHF  Secondary Diagnoses (including complications and co-morbidities):  End-of-life care/comfort measures only/DNR/DNI -Fall comfort measures initiated on 2/6. -Anticipate in-hospital death  Acute on chronic combined systolic and diastolic CHF: Echo in 15/1761 with EF of 20 to 25%, severe LVH, severely decreased RVSP.  No significant improvement with aggressive diuresis.  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  Per cardiology, very limited treatment option at this stage.  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  Fall of unknown etiology at home: No arrhythmia on pacemaker interrogation.  Concern about orthostatic hypotension.  CT head without acute finding.  No focal neuro finding on exam.    Rhabdomyolysis: Due to the above.  CK 2260> 2485> 612> 314  Multifocal community-acquired pneumonia: Procalcitonin elevated.  COVID-19 negative.  CXR concerning for RML and bilateral LLL pneumonia. Ceftriaxone and azithromycin 2/1>> 2/6  Acute metabolic encephalopathy:  CT negative, B12 and TSH not revealing.  CKD-4: Baseline Cr1.7> 2.06 (admit)> 1.74>> 1.47> 1.59.  Anemia of chronic disease: Hgb relatively stable.  Elevated LFTs/ALP/total bili: Likely due to rhabdomyolysis.  No GI symptoms.  Left upper extremity wound: [facility] Course (including significant findings, care, treatment, and services provided and events  leading to death)  Charles Mcmahon is a 74 y.o. year old male  with history of complete heart block status post pacemaker, persistent A. fib, systolic CHF EF 60%, CKD stage IV presented with fall found to be in rhabdo. CT showed dense consolidation in the right middle and bilateral lower lobes. Diagnosed with rhabdomyolysis and community-acquired pneumonia.  Rhabdomyolysis resolved.  Completed antibiotic course with ceftriaxone and azithromycin for community-acquired pneumonia. Cardiology/advanced HF team consulted for his advanced heart failure and amyloidosis.  He was deemed to have very poor prognosis due to his advanced heart failure and end-stage cardiac amyloidosis with very limited treatment options.  Cardiology recommended palliative care consult, who met with patient and patient's daughter.  Eventually, transitioned to full comfort care on 19-Dec-2019 and died on 12/21/2019 at 7:19 AM.  Family notified at bedside.  Desaturated complicated.  Pertinent Labs and Studies  Significant Diagnostic Studies DG Chest 1 View  Result Date: 12-13-19 CLINICAL DATA:  Pain secondary to a fall due to syncope. EXAM: CHEST  1 VIEW COMPARISON:  04/26/2019 FINDINGS: There is a new abnormal density in the right middle lobe adjacent to the right heart border which could represent an infiltrate or mass. There is new slight atelectasis at the left base posterior medially. Heart size and pulmonary vascularity are normal. Aortic atherosclerosis. Pacemaker in place. No discrete effusions or acute bone abnormalities. IMPRESSION: New abnormal density in the right middle lobe which could represent an infiltrate or mass. CT scan of the chest with contrast is recommended for further evaluation if the patient does not have findings consistent with pneumonia. Aortic Atherosclerosis (ICD10-I70.0). Electronically Signed   By: Lorriane Shire M.D.   On: 13-Dec-2019 10:58   CT Head Wo Contrast  Result Date: 2019/12/13 CLINICAL DATA:  Trauma to the head and neck. EXAM: CT HEAD WITHOUT CONTRAST CT CERVICAL SPINE WITHOUT CONTRAST TECHNIQUE: Multidetector CT imaging of the head and cervical spine was performed following the standard protocol without intravenous contrast. Multiplanar CT image reconstructions of the cervical spine were also generated. COMPARISON:  09/08/2019 FINDINGS: CT HEAD FINDINGS Brain: Age related volume loss. No evidence of old or acute focal infarction, mass lesion, hemorrhage, hydrocephalus or extra-axial collection. Vascular: No abnormal vascular finding. Skull: No skull fracture. Sinuses/Orbits: Previous functional endoscopic sinus surgery. Other: None CT CERVICAL SPINE FINDINGS Alignment: No traumatic malalignment. 2 mm degenerative anterolisthesis C4-5. Skull base and vertebrae: Benign sclerotic focus within the posterior left C5 vertebral body. No significant primary bone lesion. No evidence of regional fracture. Soft tissues and spinal canal: No traumatic finding. Disc levels: Pronounced facet arthropathy on the right at C7-T1. Pronounced facet arthropathy on the left at C6-7. Lesser facet osteoarthritis on the left at C3-4, C4-5, C5-6 and C7-T1. No evidence of compressive canal stenosis. Foraminal narrowing on the left that could be symptomatic. Upper chest: Negative Other: None IMPRESSION: Head CT: No acute or traumatic finding.  Age related atrophy. Cervical spine CT: No acute or traumatic finding. Extensive chronic facet arthropathy left more than right as outlined above. Electronically Signed   By: Nelson Chimes M.D.   On: 12/04/2019 11:12   CT Chest Wo Contrast  Result Date: 12/02/2019 CLINICAL DATA:  History of fall, anticoagulated EXAM: CT CHEST WITHOUT CONTRAST TECHNIQUE: Multidetector CT imaging of the chest was performed following the standard protocol without IV contrast. COMPARISON:  12/05/2019 FINDINGS: Cardiovascular: Evaluation of the heart and great vessels is limited without IV contrast in the  setting of trauma. There is a small pericardial effusion. Multi lead pacemaker is identified. Mild atherosclerosis of the LAD distribution of the coronary vessels. No pathologic adenopathy. Mediastinum/Nodes: No pathologic adenopathy. Lungs/Pleura: There are bilateral pleural effusions, volume estimated less than 1 L each. Areas of compressive atelectasis are seen within the bilateral lower lobes. Dense consolidation within the medial segment right middle lobe, as well as patchy consolidation within the bilateral lower lobes, could reflect multifocal bronchopneumonia. No pneumothorax. Upper Abdomen: Trace ascites within the upper abdomen. Nodularity of the liver capsule suggests cirrhosis. Otherwise no acute process. Musculoskeletal: There are no acute displaced fractures. Reconstructed images demonstrate evidence of a prior healed mid sternal gladiolus fracture. IMPRESSION: 1. Dense consolidation medial segment right middle lobe, with more patchy consolidation in the bilateral lower lobes. Multifocal bronchopneumonia suspected. 2. Bilateral pleural effusions volume estimated less than 1 L each. 3. Trace pericardial effusion. 4. Trace ascites. 5. Capsular liver nodularity suggesting cirrhosis. Electronically Signed   By: Randa Ngo M.D.   On: 12/02/2019 15:38   CT Cervical Spine Wo Contrast  Result Date: 12/12/2019 CLINICAL DATA:  Trauma to the head and neck. EXAM: CT HEAD WITHOUT CONTRAST CT CERVICAL SPINE WITHOUT CONTRAST TECHNIQUE: Multidetector CT imaging of the head and cervical spine was performed following the standard protocol without intravenous contrast. Multiplanar CT image reconstructions of the cervical spine were also generated. COMPARISON:  09/08/2019 FINDINGS: CT HEAD FINDINGS Brain: Age related volume loss. No evidence of old or acute focal infarction, mass lesion, hemorrhage, hydrocephalus or extra-axial collection. Vascular: No abnormal vascular finding. Skull: No skull fracture.  Sinuses/Orbits: Previous functional endoscopic sinus surgery. Other: None CT CERVICAL SPINE FINDINGS Alignment: No traumatic malalignment. 2 mm degenerative anterolisthesis C4-5. Skull base and vertebrae: Benign sclerotic focus within the posterior left C5 vertebral body. No significant primary  bone lesion. No evidence of regional fracture. Soft tissues and spinal canal: No traumatic finding. Disc levels: Pronounced facet arthropathy on the right at C7-T1. Pronounced facet arthropathy on the left at C6-7. Lesser facet osteoarthritis on the left at C3-4, C4-5, C5-6 and C7-T1. No evidence of compressive canal stenosis. Foraminal narrowing on the left that could be symptomatic. Upper chest: Negative Other: None IMPRESSION: Head CT: No acute or traumatic finding.  Age related atrophy. Cervical spine CT: No acute or traumatic finding. Extensive chronic facet arthropathy left more than right as outlined above. Electronically Signed   By: Nelson Chimes M.D.   On: 11/19/2019 11:12   NM CARDIAC AMYLOID TUMOR LOC INFLAM SPECT 1 DAY  Result Date: 10/29/2019 CLINICAL DATA:  HEART FAILURE. CONCERN FOR CARDIAC AMYLOIDOSIS. EXAM: NUCLEAR MEDICINE TUMOR LOCALIZATION. PYP CARDIAC AMYLOIDOSIS SCAN WITH SPECT TECHNIQUE: Following intravenous administration of radiopharmaceutical, anterior planar images of the chest were obtained. Regions of interest were placed on the heart and contralateral chest wall for quantitative assessment. Additional SPECT imaging of the chest was obtained. RADIOPHARMACEUTICALS:  Twenty-one mCi TECHNETIUM 99 PYROPHOSPHATE FINDINGS: Planar Visual assessment: Anterior planar imaging demonstrates radiotracer uptake within the heart equal to uptake within the adjacent ribs (Grade 2). Quantitative assessment : Quantitative assessment of the cardiac uptake compared to the contralateral chest wall is equal to 1.8 (H/CL = 1.82). SPECT assessment: SPECT imaging of the chest demonstrates clear radiotracer  accumulation within the LEFT ventricle. IMPRESSION: Visual and quantitative assessment (grade 2, H/CLL equal 1.8) are strongly suggestive of transthyretin amyloidosis. Electronically Signed   By: Suzy Bouchard M.D.   On: 10/29/2019 13:17   DG Shoulder Left  Result Date: 11/20/2019 CLINICAL DATA:  Syncope with fall.  Left shoulder pain EXAM: LEFT SHOULDER - 2+ VIEW COMPARISON:  None. FINDINGS: No acute fracture or dislocation. Chronic calcification or ossicle about the spurred acromioclavicular joint, favor the latter. Minimal calcification at the greater tuberosity of the humerus, chronic appearing. Prominent degenerative facet spurring in the cervical spine. IMPRESSION: 1. Negative for fracture. 2. Small calcifications at the distal rotator cuff which could reflect calcific tendinitis. Electronically Signed   By: Monte Fantasia M.D.   On: 12/04/2019 10:56   DG Hip Unilat With Pelvis 2-3 Views Right  Result Date: 12/09/2019 CLINICAL DATA:  Right hip pain after fall. EXAM: DG HIP (WITH OR WITHOUT PELVIS) 2-3V RIGHT COMPARISON:  None. FINDINGS: There is no evidence of hip fracture or dislocation. There is no evidence of arthropathy or other focal bone abnormality. IMPRESSION: Negative. Electronically Signed   By: Marijo Conception M.D.   On: 12/14/2019 10:59    Microbiology Recent Results (from the past 240 hour(s))  Respiratory Panel by RT PCR (Flu A&B, Covid) - Nasopharyngeal Swab     Status: None   Collection Time: 12/03/2019  3:51 PM   Specimen: Nasopharyngeal Swab  Result Value Ref Range Status   SARS Coronavirus 2 by RT PCR NEGATIVE NEGATIVE Final    Comment: (NOTE) SARS-CoV-2 target nucleic acids are NOT DETECTED. The SARS-CoV-2 RNA is generally detectable in upper respiratoy specimens during the acute phase of infection. The lowest concentration of SARS-CoV-2 viral copies this assay can detect is 131 copies/mL. A negative result does not preclude SARS-Cov-2 infection and should not be  used as the sole basis for treatment or other patient management decisions. A negative result may occur with  improper specimen collection/handling, submission of specimen other than nasopharyngeal swab, presence of viral mutation(s) within the areas targeted by  this assay, and inadequate number of viral copies (<131 copies/mL). A negative result must be combined with clinical observations, patient history, and epidemiological information. The expected result is Negative. Fact Sheet for Patients:  PinkCheek.be Fact Sheet for Healthcare Providers:  GravelBags.it This test is not yet ap proved or cleared by the [place] FDA and  has been authorized for detection and/or diagnosis of SARS-CoV-2 by FDA under an Emergency Use Authorization (EUA). This EUA will remain  in effect (meaning this test can be used) for the duration of the COVID-19 declaration under Section 564(b)(1) of the Act, 21 U.S.C. section 360bbb-3(b)(1), unless the authorization is terminated or revoked sooner.    Influenza A by PCR NEGATIVE NEGATIVE Final   Influenza B by PCR NEGATIVE NEGATIVE Final    Comment: (NOTE) The Xpert Xpress SARS-CoV-2/FLU/RSV assay is intended as an aid in  the diagnosis of influenza from Nasopharyngeal swab specimens and  should not be used as a sole basis for treatment. Nasal washings and  aspirates are unacceptable for Xpert Xpress SARS-CoV-2/FLU/RSV  testing. Fact Sheet for Patients: PinkCheek.be Fact Sheet for Healthcare Providers: GravelBags.it This test is not yet approved or cleared by the [place] FDA and  has been authorized for detection and/or diagnosis of SARS-CoV-2 by  FDA under an Emergency Use Authorization (EUA). This EUA will remain  in effect (meaning this test can be used) for the duration of the  Covid-19 declaration under Section 564(b)(1) of the  Act, 21  U.S.C. section 360bbb-3(b)(1), unless the authorization is  terminated or revoked. Performed at [facility], [place] [address], [place], [place] [address]     Lab Basic Metabolic Panel: Recent Labs  Lab 11/19/19 [phone] 11/20/19 0259 11/20/19 1447 11/21/19 0231 11/22/19 0314  NA 140 135 137 136 136  K 4.7 5.7* 4.0 3.9 4.5  CL 101 100 99 98 97*  CO2 '27 22 26 25 24  '$ GLUCOSE 102* 100* 126* 113* 111*  BUN 57* 53* 48* 46* 39*  CREATININE 1.74* 1.65* 1.49* 1.47* 1.59*  CALCIUM 8.8* 8.8* 8.8* 8.8* 8.9  MG 2.2 2.5*  --  2.2 2.2   Liver Function Tests: Recent Labs  Lab 11/19/19 0237 11/20/19 0259 11/21/19 0231  AST 120* 118* 75*  ALT 46* 50* 47*  ALKPHOS 320* 411* 484*  BILITOT 1.2 1.6* 1.0  PROT 4.8* 4.9* 5.1*  ALBUMIN 2.2* 2.4* 2.4*   No results for input(s): LIPASE, AMYLASE in the last 168 hours. No results for input(s): AMMONIA in the last 168 hours. CBC: Recent Labs  Lab 11/21/19 0231 11/22/19 0314  HGB 12.2* 12.8*  HCT 39.3 40.5   Cardiac Enzymes: Recent Labs  Lab 11/19/19 1245 11/20/19 0259 11/21/19 0231  CKTOTAL 1,236* 612* 315   Sepsis Labs: Recent Labs  Lab 11/19/19 0237  PROCALCITON 0.87    Procedures/Operations  None   Brantlee Hinde T Valma Rotenberg 12-10-2019, 2:43 PM

## 2019-12-15 NOTE — Progress Notes (Signed)
20cc morphine wasted in stericycle with Veneta Penton, RN.

## 2019-12-15 DEATH — deceased

## 2020-04-21 ENCOUNTER — Ambulatory Visit: Payer: Medicare Other | Admitting: Adult Health

## 2021-04-03 IMAGING — DX RIGHT HAND - COMPLETE 3+ VIEW
4 series · 4 of 4 positions shown · non-contrast
Comparison: None.

CLINICAL DATA: Right middle finger pain.  Swelling and redness.

EXAM:
RIGHT HAND - COMPLETE 3+ VIEW

[hand ap]
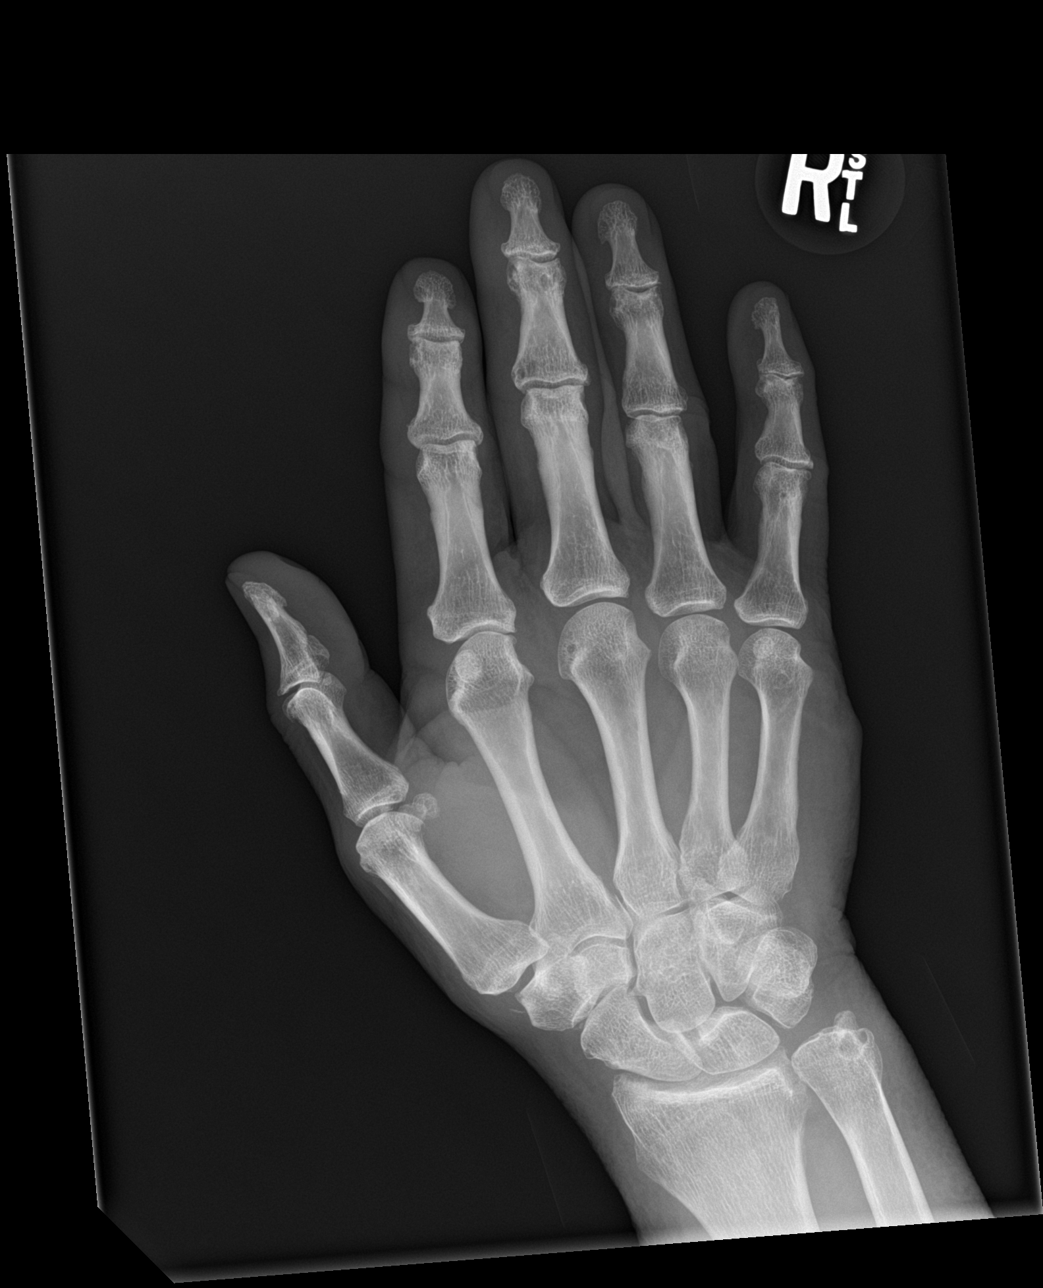

[hand obl]
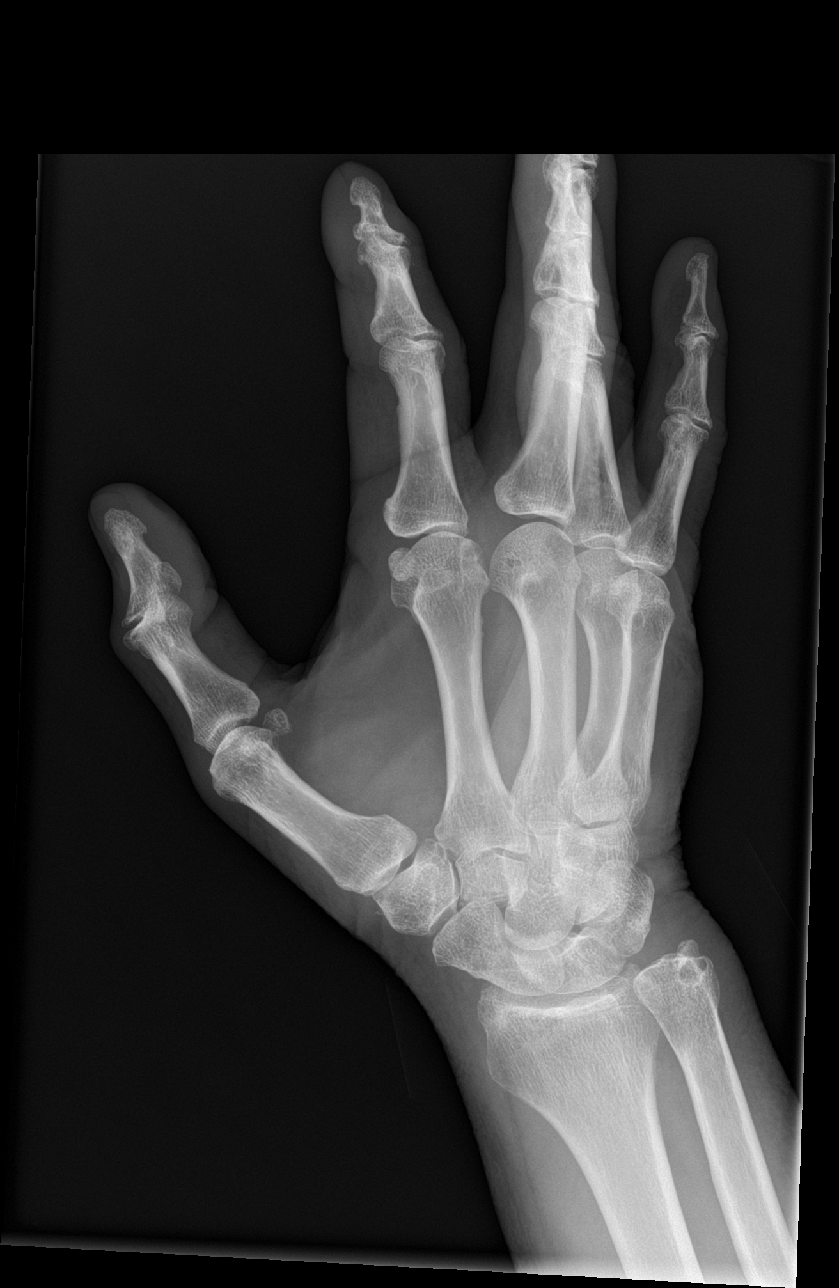

[hand lat (1 of 2)]
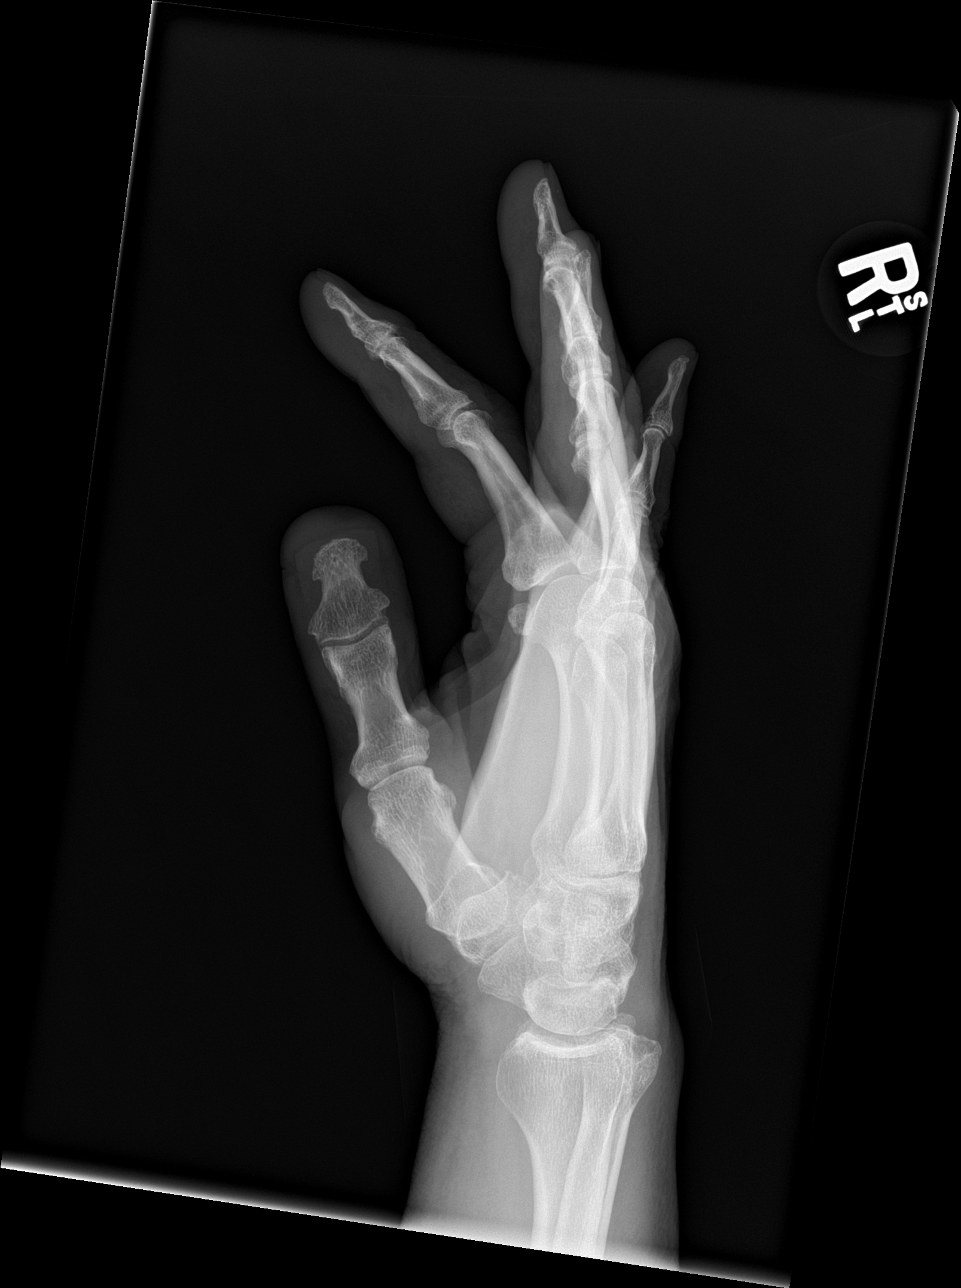

[hand lat (2 of 2)]
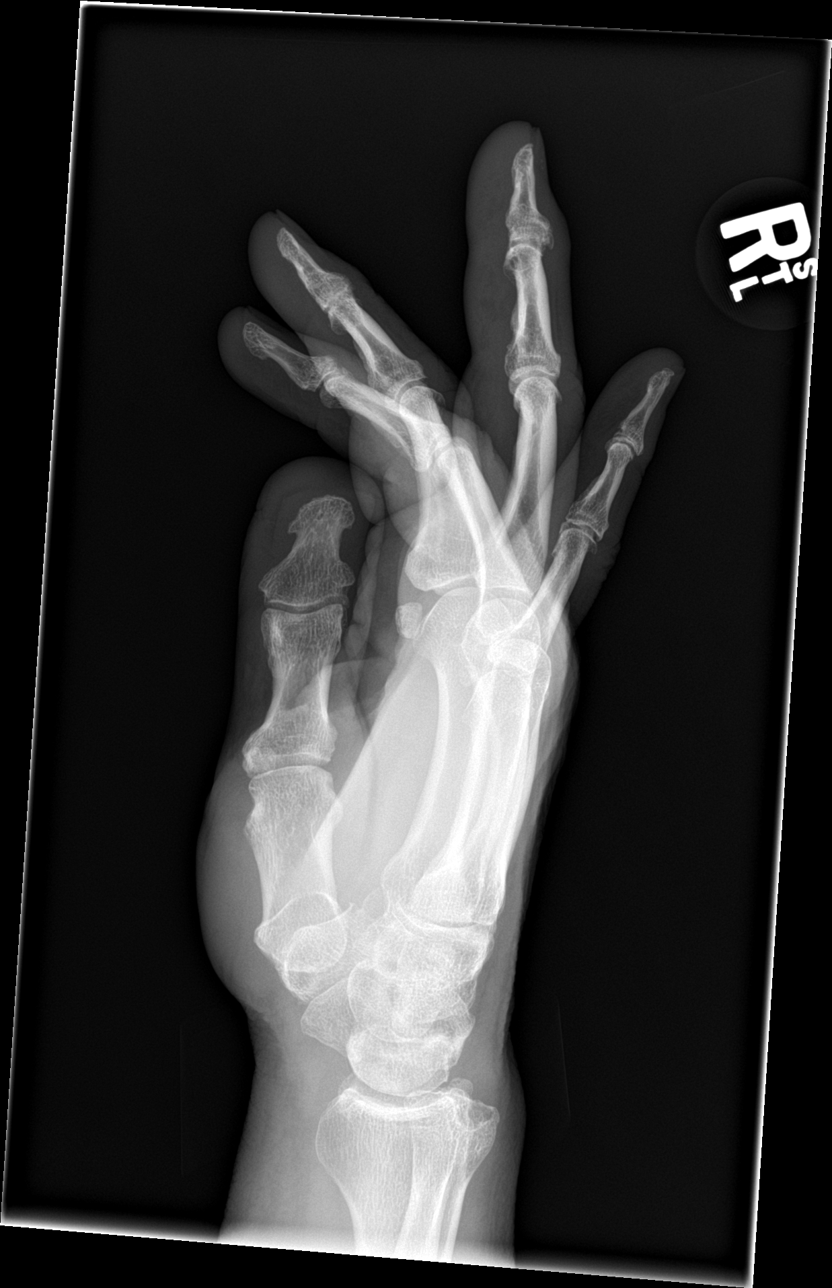

[4 of 4 positions shown; findings below may reference images not displayed]

FINDINGS: Scattered degenerative changes. No cause for the right middle finger
symptoms. No evidence of osteomyelitis. No fractures.
IMPRESSION: No acute abnormalities identified.

## 2021-04-03 IMAGING — DX PORTABLE CHEST - 1 VIEW
1 series · 1 of 1 positions shown · non-contrast
Comparison: None.

CLINICAL DATA: Swelling and redness.  Low O2 sats.

EXAM:
PORTABLE CHEST 1 VIEW

[chest ap]
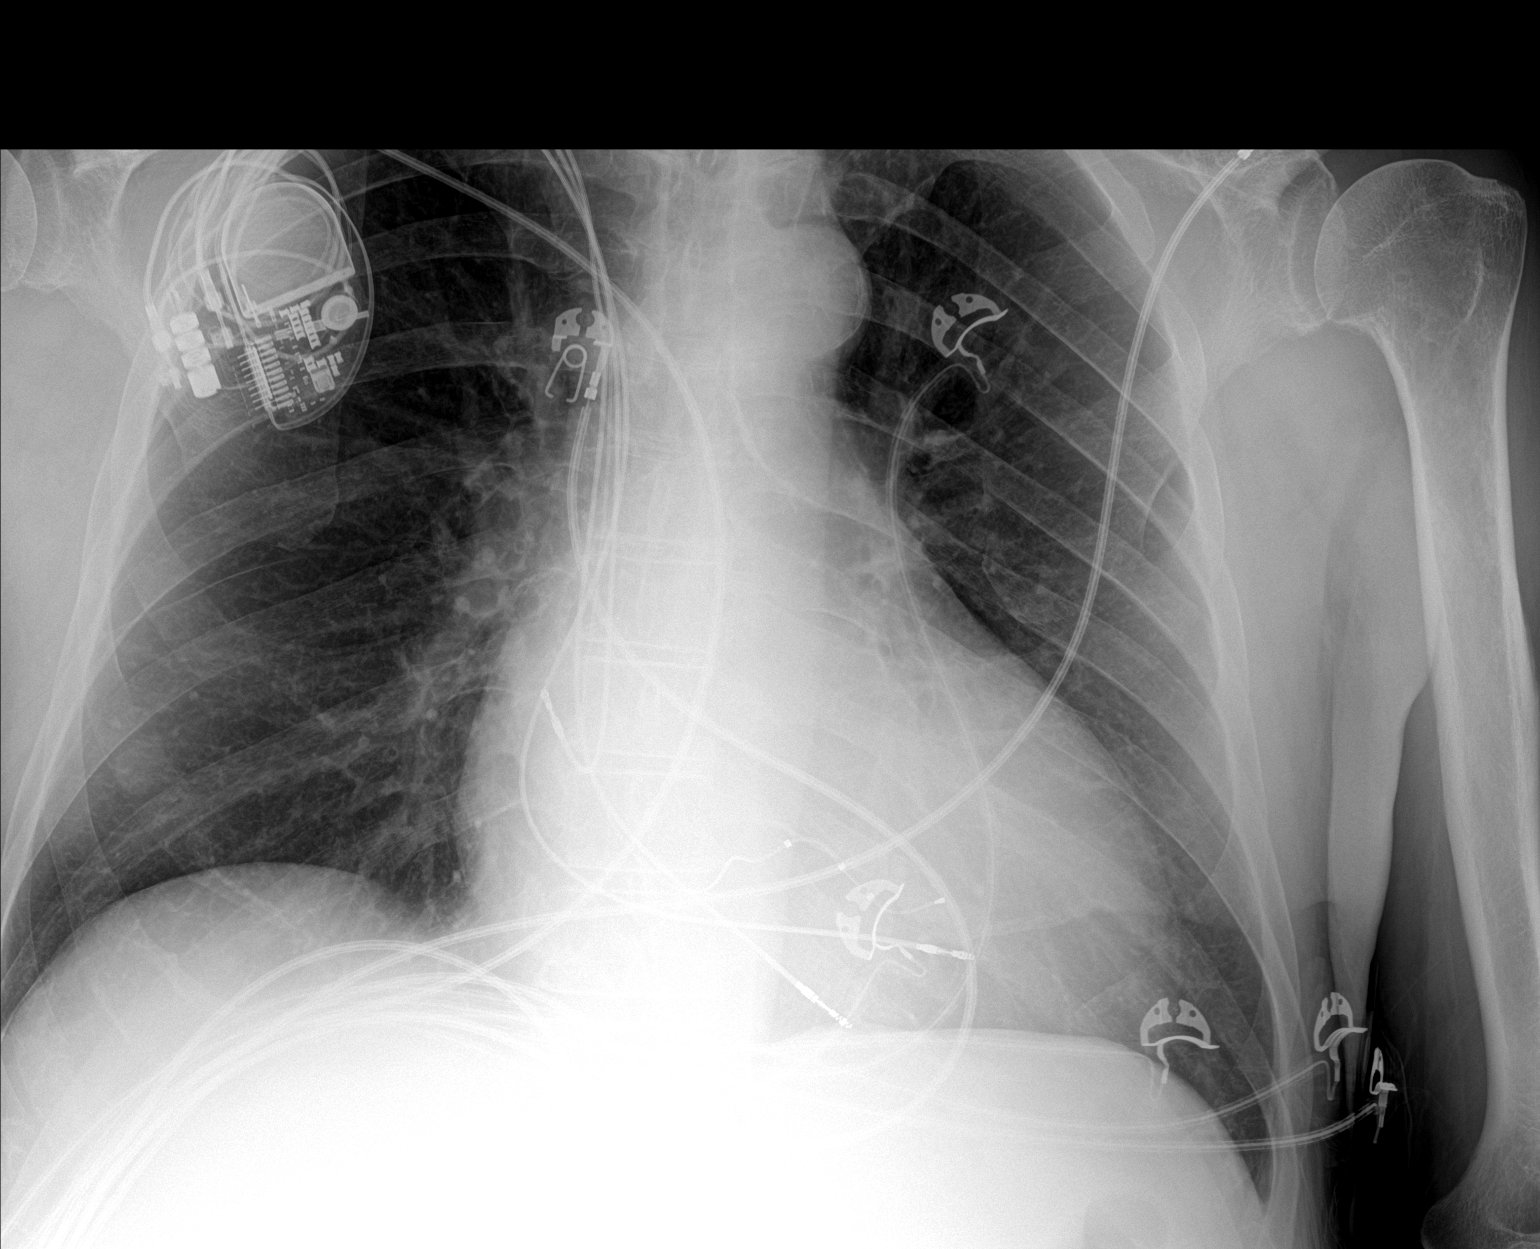

[1 of 1 positions shown; findings below may reference images not displayed]

FINDINGS: There is a 4 lead pacemaker. Cardiomegaly. The hila and mediastinum
are normal. No pneumothorax. No pulmonary nodules, masses, or focal
infiltrates.
IMPRESSION: No active disease.

## 2021-10-18 NOTE — Telephone Encounter (Signed)
Entered in error
